# Patient Record
Sex: Female | Born: 1960
Health system: Southern US, Community
[De-identification: ages and names within clinical notes are randomized; demographics above are authoritative.]

## PROBLEM LIST (undated history)

## (undated) DIAGNOSIS — E0590 Thyrotoxicosis, unspecified without thyrotoxic crisis or storm: Secondary | ICD-10-CM

## (undated) DIAGNOSIS — R42 Dizziness and giddiness: Secondary | ICD-10-CM

## (undated) DIAGNOSIS — Z972 Presence of dental prosthetic device (complete) (partial): Secondary | ICD-10-CM

## (undated) DIAGNOSIS — M109 Gout, unspecified: Secondary | ICD-10-CM

## (undated) DIAGNOSIS — I10 Essential (primary) hypertension: Secondary | ICD-10-CM

## (undated) DIAGNOSIS — K219 Gastro-esophageal reflux disease without esophagitis: Secondary | ICD-10-CM

## (undated) DIAGNOSIS — F419 Anxiety disorder, unspecified: Secondary | ICD-10-CM

## (undated) DIAGNOSIS — M1990 Unspecified osteoarthritis, unspecified site: Secondary | ICD-10-CM

## (undated) DIAGNOSIS — T8859XA Other complications of anesthesia, initial encounter: Secondary | ICD-10-CM

## (undated) DIAGNOSIS — E079 Disorder of thyroid, unspecified: Secondary | ICD-10-CM

## (undated) HISTORY — DX: Gout, unspecified: M10.9

## (undated) HISTORY — DX: Dizziness and giddiness: R42

## (undated) HISTORY — DX: Disorder of thyroid, unspecified: E07.9

## (undated) HISTORY — DX: Essential (primary) hypertension: I10

## (undated) HISTORY — PX: WRIST SURGERY: SHX841

---

## 2008-09-06 HISTORY — PX: ENDOMETRIAL ABLATION: SHX621

## 2018-11-10 DIAGNOSIS — J0300 Acute streptococcal tonsillitis, unspecified: Secondary | ICD-10-CM | POA: Diagnosis not present

## 2018-11-23 ENCOUNTER — Ambulatory Visit: Payer: Self-pay | Admitting: Family Medicine

## 2018-11-30 ENCOUNTER — Ambulatory Visit: Payer: Self-pay | Admitting: Internal Medicine

## 2019-01-12 ENCOUNTER — Ambulatory Visit (INDEPENDENT_AMBULATORY_CARE_PROVIDER_SITE_OTHER): Payer: BLUE CROSS/BLUE SHIELD | Admitting: Family Medicine

## 2019-01-12 ENCOUNTER — Telehealth: Payer: Self-pay | Admitting: Family Medicine

## 2019-01-12 ENCOUNTER — Encounter: Payer: Self-pay | Admitting: Family Medicine

## 2019-01-12 ENCOUNTER — Other Ambulatory Visit: Payer: Self-pay

## 2019-01-12 DIAGNOSIS — G629 Polyneuropathy, unspecified: Secondary | ICD-10-CM | POA: Diagnosis not present

## 2019-01-12 DIAGNOSIS — I10 Essential (primary) hypertension: Secondary | ICD-10-CM | POA: Diagnosis not present

## 2019-01-12 DIAGNOSIS — E0590 Thyrotoxicosis, unspecified without thyrotoxic crisis or storm: Secondary | ICD-10-CM | POA: Diagnosis not present

## 2019-01-12 DIAGNOSIS — M109 Gout, unspecified: Secondary | ICD-10-CM | POA: Diagnosis not present

## 2019-01-12 DIAGNOSIS — Z114 Encounter for screening for human immunodeficiency virus [HIV]: Secondary | ICD-10-CM

## 2019-01-12 DIAGNOSIS — Z1159 Encounter for screening for other viral diseases: Secondary | ICD-10-CM

## 2019-01-12 MED ORDER — PROPYLTHIOURACIL 50 MG PO TABS: 50.0000 mg | ORAL_TABLET | Freq: Three times a day (TID) | ORAL | 0 refills | Status: DC

## 2019-01-12 MED ORDER — GABAPENTIN 100 MG PO CAPS: 100.0000 mg | ORAL_CAPSULE | Freq: Three times a day (TID) | ORAL | 0 refills | Status: DC

## 2019-01-12 MED ORDER — PROPRANOLOL HCL 20 MG PO TABS: 20.0000 mg | ORAL_TABLET | Freq: Three times a day (TID) | ORAL | 0 refills | Status: DC

## 2019-01-12 MED ORDER — COLCHICINE 0.6 MG PO TABS: ORAL_TABLET | ORAL | 0 refills | Status: DC

## 2019-01-12 MED ORDER — AZILSARTAN MEDOXOMIL 40 MG PO TABS: 1.0000 | ORAL_TABLET | Freq: Every day | ORAL | 0 refills | Status: DC

## 2019-01-12 NOTE — Telephone Encounter (Signed)
Lab appt scheduled.

## 2019-01-12 NOTE — Telephone Encounter (Signed)
Please put her on for fasting labs for Tuesday 5/19 at 930 AM   Thanks  LG

## 2019-01-12 NOTE — Progress Notes (Signed)
Patient ID: Elizabeth [name], female   DOB: 09-13-1960, 58 y.o.   MRN: [id number]    Virtual Visit via video Note  This visit type was conducted due to national recommendations for restrictions regarding the COVID-19 pandemic (e.g. social distancing).  This format is felt to be most appropriate for this patient at this time.  All issues noted in this document were discussed and addressed.  No physical exam was performed (except for noted visual exam findings with Video Visits).   I connected with Elizabeth [name] today at  1:00 PM EDT by a video enabled telemedicine application and verified that I am speaking with the correct person using two identifiers. Location patient: home Location provider: [facility] Persons participating in the virtual visit: patient, provider  I discussed the limitations, risks, security and privacy concerns of performing an evaluation and management service by video and the availability of in person appointments. I also discussed with the patient that there may be a patient responsible charge related to this service. The patient expressed understanding and agreed to proceed.  HPI:  Patient and I connected via video to have patient establish with PCP.  She moved to the [place] area from [place] about 3 months ago.  She needs to have a primary care here so she can get medications refilled.  With transitions [facility], well so far.  Patient has been remaining home for most of her time throughout the COVID-19 pandemic.  Does state about 2 weeks ago she had a flareup of gout in right big toe after eating a lot of bacon and red meat.  Patient in the past had used an as needed medication to calm gout flareup and did not have it at this time.  Gout flareup has calm down, but would like to have medication on hand in the future if it does recur.  Checked her blood pressure at home a few days ago, and this morning 132/80.  Feels well on current blood pressure  medications.  Heart rates have been between 68-70 does not feel overly tired on propranolol.  Tolerating her PTU without any issues, feels well on stabilization dose.  Has not had blood work in approximately 6 to 8 months.  Believes her last Pap smear was between 2 or 3 years ago, never has had abnormal Pap smear.  Colonoscopy was in 2019.  Past medical, social, surgical and family history reviewed and updated accordingly in chart.  ROS:   Constitutional: Negative for chills, fatigue and fever.  HENT: Negative for congestion, ear pain, sinus pain and sore throat.   Eyes: Negative.   Respiratory: Negative for cough, shortness of breath and wheezing.   Cardiovascular: Negative for chest pain, palpitations and leg swelling.  Gastrointestinal: Negative for abdominal pain, diarrhea, nausea and vomiting.  Genitourinary: Negative for dysuria, frequency and urgency.  Musculoskeletal: Negative for arthralgias and myalgias.  Skin: Negative for color change, pallor and rash.  Neurological: Negative for syncope, light-headedness and headaches.  Psychiatric/Behavioral: The patient is not nervous/anxious.     Past Medical History:  Diagnosis Date  . Gout   . Hypertension   . Thyroid disease     Past Surgical History:  Procedure Laterality Date  . ENDOMETRIAL ABLATION  2010    Family History  Problem Relation Age of Onset  . Hypertension Mother   . Heart disease Mother   . Hypertension Father     Social History   Tobacco Use  . Smoking status: Former Smoker  Last attempt to quit: 01/12/1999    Years since quitting: 20.0  . Smokeless tobacco: Never Used  Substance Use Topics  . Alcohol use: Yes    Comment: ocassionally    Current Outpatient Medications:  .  Azilsartan Medoxomil 40 MG TABS, Take by mouth., Disp: , Rfl:  .  FLUTICASONE PROPIONATE NA, Place into the nose., Disp: , Rfl:  .  gabapentin (NEURONTIN) 100 MG capsule, Take 100 mg by mouth 3 (three) times daily., Disp:  , Rfl:  .  ibuprofen (ADVIL) 200 MG tablet, Take 200 mg by mouth every 6 (six) hours as needed., Disp: , Rfl:  .  propranolol (INDERAL) 20 MG tablet, Take 20 mg by mouth 3 (three) times daily., Disp: , Rfl:  .  propylthiouracil (PTU) 50 MG tablet, Take 50 mg by mouth 3 (three) times daily., Disp: , Rfl:   EXAM:  GENERAL: alert, oriented, appears well and in no acute distress  HEENT: atraumatic, conjunttiva clear, no obvious abnormalities on inspection of external nose and ears  NECK: normal movements of the head and neck  LUNGS: on inspection no signs of respiratory distress, breathing rate appears normal, no obvious gross SOB, gasping or wheezing  CV: no obvious cyanosis  MS: moves all visible extremities without noticeable abnormality  PSYCH/NEURO: pleasant and cooperative, no obvious depression or anxiety, speech and thought processing grossly intact  ASSESSMENT AND PLAN:  Discussed the following assessment and plan:  Essential hypertension - Plan: Azilsartan Medoxomil 40 MG TABS, propranolol (INDERAL) 20 MG tablet, CBC w/Diff, Comp Met (CMET), Lipid panel, Thyroid Panel With TSH  Hyperthyroidism - Plan: propylthiouracil (PTU) 50 MG tablet, Thyroid Panel With TSH, HgB A1c  Neuropathy - Plan: gabapentin (NEURONTIN) 100 MG capsule, CBC w/Diff, Comp Met (CMET)  Acute gout, unspecified cause, unspecified site - Plan: colchicine 0.6 MG tablet, Uric acid  Encounter for screening for HIV - Plan: HIV antibody (with reflex)  Need for hepatitis C screening test - Plan: Hepatitis C Antibody  Medication refill sent in for patient.  We will have her come in for blood work in the next 1 to 2 weeks to check blood count, electrolyte panel, liver and kidney functions, thyroid panel, cholesterol panel and do screening for HIV and hep C.  We will make any medication adjustments as needed based on lab results.  Colcrys sent in for patient to use as needed for a gout flare up. We will plan to have  patient come in for a complete physical exam at some point this summer and we will get her Pap smear completed and get her mammogram ordered at that time.   I discussed the assessment and treatment plan with the patient. The patient was provided an opportunity to ask questions and all were answered. The patient agreed with the plan and demonstrated an understanding of the instructions.   The patient was advised to call back or seek an in-person evaluation if the symptoms worsen or if the condition fails to improve as anticipated.  Jodelle Green, FNP

## 2019-01-23 ENCOUNTER — Other Ambulatory Visit (INDEPENDENT_AMBULATORY_CARE_PROVIDER_SITE_OTHER): Payer: BLUE CROSS/BLUE SHIELD

## 2019-01-23 ENCOUNTER — Other Ambulatory Visit: Payer: Self-pay

## 2019-01-23 DIAGNOSIS — M109 Gout, unspecified: Secondary | ICD-10-CM

## 2019-01-23 DIAGNOSIS — G629 Polyneuropathy, unspecified: Secondary | ICD-10-CM | POA: Diagnosis not present

## 2019-01-23 DIAGNOSIS — E0590 Thyrotoxicosis, unspecified without thyrotoxic crisis or storm: Secondary | ICD-10-CM

## 2019-01-23 DIAGNOSIS — I10 Essential (primary) hypertension: Secondary | ICD-10-CM | POA: Diagnosis not present

## 2019-01-23 DIAGNOSIS — Z1159 Encounter for screening for other viral diseases: Secondary | ICD-10-CM

## 2019-01-23 DIAGNOSIS — Z114 Encounter for screening for human immunodeficiency virus [HIV]: Secondary | ICD-10-CM

## 2019-01-23 LAB — CBC WITH DIFFERENTIAL/PLATELET
Basophils Absolute: 0.1 10*3/uL (ref 0.0–0.1)
Basophils Relative: 1.3 % (ref 0.0–3.0)
Eosinophils Absolute: 0.2 10*3/uL (ref 0.0–0.7)
Eosinophils Relative: 2.7 % (ref 0.0–5.0)
HCT: 40.1 % (ref 36.0–46.0)
Hemoglobin: 13.1 g/dL (ref 12.0–15.0)
Lymphocytes Relative: 46.7 % — ABNORMAL HIGH (ref 12.0–46.0)
Lymphs Abs: 2.7 10*3/uL (ref 0.7–4.0)
MCHC: 32.7 g/dL (ref 30.0–36.0)
MCV: 76 fl — ABNORMAL LOW (ref 78.0–100.0)
Monocytes Absolute: 0.6 10*3/uL (ref 0.1–1.0)
Monocytes Relative: 9.6 % (ref 3.0–12.0)
Neutro Abs: 2.3 10*3/uL (ref 1.4–7.7)
Neutrophils Relative %: 39.7 % — ABNORMAL LOW (ref 43.0–77.0)
Platelets: 299 10*3/uL (ref 150.0–400.0)
RBC: 5.28 Mil/uL — ABNORMAL HIGH (ref 3.87–5.11)
RDW: 15.2 % (ref 11.5–15.5)
WBC: 5.9 10*3/uL (ref 4.0–10.5)

## 2019-01-23 LAB — COMPREHENSIVE METABOLIC PANEL
ALT: 16 U/L (ref 0–35)
AST: 17 U/L (ref 0–37)
Albumin: 4 g/dL (ref 3.5–5.2)
Alkaline Phosphatase: 108 U/L (ref 39–117)
BUN: 18 mg/dL (ref 6–23)
CO2: 25 mEq/L (ref 19–32)
Calcium: 10.9 mg/dL — ABNORMAL HIGH (ref 8.4–10.5)
Chloride: 105 mEq/L (ref 96–112)
Creatinine, Ser: 1 mg/dL (ref 0.40–1.20)
GFR: 56.79 mL/min — ABNORMAL LOW (ref >60.00)
Glucose, Bld: 106 mg/dL — ABNORMAL HIGH (ref 70–99)
Potassium: 4.1 mEq/L (ref 3.5–5.1)
Sodium: 135 mEq/L (ref 135–145)
Total Bilirubin: 0.3 mg/dL (ref 0.2–1.2)
Total Protein: 7.1 g/dL (ref 6.0–8.3)

## 2019-01-23 LAB — LIPID PANEL
Cholesterol: 222 mg/dL — ABNORMAL HIGH (ref 0–200)
HDL: 54.1 mg/dL (ref >39.00)
LDL Cholesterol: 147 mg/dL — ABNORMAL HIGH (ref 0–99)
NonHDL: 167.76
Total CHOL/HDL Ratio: 4
Triglycerides: 104 mg/dL (ref 0.0–149.0)
VLDL: 20.8 mg/dL (ref 0.0–40.0)

## 2019-01-23 LAB — URIC ACID: Uric Acid, Serum: 5.9 mg/dL (ref 2.4–7.0)

## 2019-01-23 LAB — HEMOGLOBIN A1C: Hgb A1c MFr Bld: 5.8 % (ref 4.6–6.5)

## 2019-01-24 LAB — HEPATITIS C ANTIBODY
Hepatitis C Ab: NONREACTIVE
SIGNAL TO CUT-OFF: 0.01 (ref <1.00)

## 2019-01-24 LAB — THYROID PANEL WITH TSH
Free Thyroxine Index: 2.3 (ref 1.4–3.8)
T3 Uptake: 28 % (ref 22–35)
T4, Total: 8.2 ug/dL (ref 5.1–11.9)
TSH: 1.9 mIU/L (ref 0.40–4.50)

## 2019-01-24 LAB — HIV ANTIBODY (ROUTINE TESTING W REFLEX): HIV 1&2 Ab, 4th Generation: NONREACTIVE

## 2019-02-01 ENCOUNTER — Ambulatory Visit: Payer: Self-pay | Admitting: Internal Medicine

## 2019-02-27 ENCOUNTER — Other Ambulatory Visit: Payer: Self-pay | Admitting: Family Medicine

## 2019-02-27 ENCOUNTER — Ambulatory Visit: Payer: BLUE CROSS/BLUE SHIELD | Admitting: Family Medicine

## 2019-02-27 ENCOUNTER — Other Ambulatory Visit: Payer: Self-pay

## 2019-02-27 ENCOUNTER — Other Ambulatory Visit (HOSPITAL_COMMUNITY)
Admission: RE | Admit: 2019-02-27 | Discharge: 2019-02-27 | Disposition: A | Discharge status: Home / Self Care | Payer: BLUE CROSS/BLUE SHIELD | Source: Ambulatory Visit | Attending: Family Medicine | Admitting: Family Medicine

## 2019-02-27 ENCOUNTER — Encounter: Payer: Self-pay | Admitting: Family Medicine

## 2019-02-27 VITALS — BP 158/94 | HR 63 | Temp 98.5°F | Resp 18 | Ht 64.0 in | Wt 180.6 lb

## 2019-02-27 DIAGNOSIS — R1031 Right lower quadrant pain: Secondary | ICD-10-CM

## 2019-02-27 DIAGNOSIS — I10 Essential (primary) hypertension: Secondary | ICD-10-CM

## 2019-02-27 DIAGNOSIS — Z1231 Encounter for screening mammogram for malignant neoplasm of breast: Secondary | ICD-10-CM

## 2019-02-27 DIAGNOSIS — Z124 Encounter for screening for malignant neoplasm of cervix: Secondary | ICD-10-CM

## 2019-02-27 DIAGNOSIS — Z0000 Encounter for general adult medical examination without abnormal findings: Secondary | ICD-10-CM | POA: Diagnosis not present

## 2019-02-27 DIAGNOSIS — E0590 Thyrotoxicosis, unspecified without thyrotoxic crisis or storm: Secondary | ICD-10-CM | POA: Diagnosis not present

## 2019-02-27 LAB — B12 AND FOLATE PANEL
Folate: 10 ng/mL (ref >5.9)
Vitamin B-12: 494 pg/mL (ref 211–911)

## 2019-02-27 LAB — CBC WITH DIFFERENTIAL/PLATELET
Basophils Absolute: 0 10*3/uL (ref 0.0–0.1)
Basophils Relative: 0.5 % (ref 0.0–3.0)
Eosinophils Absolute: 0.1 10*3/uL (ref 0.0–0.7)
Eosinophils Relative: 2.3 % (ref 0.0–5.0)
HCT: 40.1 % (ref 36.0–46.0)
Hemoglobin: 13 g/dL (ref 12.0–15.0)
Lymphocytes Relative: 45.6 % (ref 12.0–46.0)
Lymphs Abs: 2.5 10*3/uL (ref 0.7–4.0)
MCHC: 32.5 g/dL (ref 30.0–36.0)
MCV: 76.9 fl — ABNORMAL LOW (ref 78.0–100.0)
Monocytes Absolute: 0.5 10*3/uL (ref 0.1–1.0)
Monocytes Relative: 8.7 % (ref 3.0–12.0)
Neutro Abs: 2.3 10*3/uL (ref 1.4–7.7)
Neutrophils Relative %: 42.9 % — ABNORMAL LOW (ref 43.0–77.0)
Platelets: 299 10*3/uL (ref 150.0–400.0)
RBC: 5.21 Mil/uL — ABNORMAL HIGH (ref 3.87–5.11)
RDW: 15.6 % — ABNORMAL HIGH (ref 11.5–15.5)
WBC: 5.4 10*3/uL (ref 4.0–10.5)

## 2019-02-27 LAB — COMPREHENSIVE METABOLIC PANEL
ALT: 16 U/L (ref 0–35)
AST: 16 U/L (ref 0–37)
Albumin: 4 g/dL (ref 3.5–5.2)
Alkaline Phosphatase: 119 U/L — ABNORMAL HIGH (ref 39–117)
BUN: 16 mg/dL (ref 6–23)
CO2: 27 mEq/L (ref 19–32)
Calcium: 11.4 mg/dL — ABNORMAL HIGH (ref 8.4–10.5)
Chloride: 103 mEq/L (ref 96–112)
Creatinine, Ser: 0.93 mg/dL (ref 0.40–1.20)
GFR: 61.73 mL/min (ref >60.00)
Glucose, Bld: 103 mg/dL — ABNORMAL HIGH (ref 70–99)
Potassium: 4.3 mEq/L (ref 3.5–5.1)
Sodium: 136 mEq/L (ref 135–145)
Total Bilirubin: 0.4 mg/dL (ref 0.2–1.2)
Total Protein: 7 g/dL (ref 6.0–8.3)

## 2019-02-27 LAB — LIPID PANEL
Cholesterol: 238 mg/dL — ABNORMAL HIGH (ref 0–200)
HDL: 54.9 mg/dL (ref >39.00)
LDL Cholesterol: 158 mg/dL — ABNORMAL HIGH (ref 0–99)
NonHDL: 182.63
Total CHOL/HDL Ratio: 4
Triglycerides: 125 mg/dL (ref 0.0–149.0)
VLDL: 25 mg/dL (ref 0.0–40.0)

## 2019-02-27 LAB — VITAMIN D 25 HYDROXY (VIT D DEFICIENCY, FRACTURES): VITD: 34.72 ng/mL (ref 30.00–100.00)

## 2019-02-27 MED ORDER — AMLODIPINE BESYLATE 10 MG PO TABS: 10.0000 mg | ORAL_TABLET | Freq: Every day | ORAL | 1 refills | Status: DC

## 2019-02-27 NOTE — Progress Notes (Signed)
Subjective:    Patient ID: Elizabeth [name], female    DOB: June 17, 1961, 58 y.o.   MRN: [id number]  HPI   Patient presents to clinic for complete physical exam and for follow-up on blood pressure and hypothyroidism.  Patient recently moved to the area from [place].  Currently she is feeling well, only complaint is some right lower quadrant cramping off and on for many months.  Patient has had a colonoscopy recently, in 2019 per patient report.  We will request these records for confirmation. Denies diarrhea or constipation.   Patient is unsure of last mammogram or Pap smear.  She usually sees dentist every 6 months and eye doctor every year.  Patient does have appointments with the pathologist and eye doctor set up for this summer.  Patient has been on propranolol and azilsartan for many years for BP control.  Not sure if the combination is helping.  States that as losartan is quite expensive would prefer to get off that medication and try something like respective and something that works better.  Denies any chest pain, palpitations or lower extremity swelling.  Denies seeing a endocrinologist recently for her hyperthyroidism.  Patient states she has been on the 50 mg PTU dose for as far she can remember.  Patient Active Problem List   Diagnosis Date Noted  . Essential hypertension 01/12/2019  . Hyperthyroidism 01/12/2019  . Neuropathy 01/12/2019   Social History   Tobacco Use  . Smoking status: Former Smoker    Quit date: 01/12/1999    Years since quitting: 20.1  . Smokeless tobacco: Never Used  Substance Use Topics  . Alcohol use: Yes    Comment: ocassionally   Past Surgical History:  Procedure Laterality Date  . ENDOMETRIAL ABLATION  2010   Family History  Problem Relation Age of Onset  . Hypertension Mother   . Heart disease Mother   . Hypertension Father      Review of Systems  Constitutional: Negative for chills, fatigue and fever.  HENT: Negative for  congestion, ear pain, sinus pain and sore throat.   Eyes: Negative.   Respiratory: Negative for cough, shortness of breath and wheezing.   Cardiovascular: Negative for chest pain, palpitations and leg swelling.  Gastrointestinal: +RLQ abd pain and cramping off on for months. Negative for diarrhea, nausea and vomiting.  Genitourinary: Negative for dysuria, frequency and urgency.  Musculoskeletal: Negative for arthralgias and myalgias.  Skin: Negative for color change, pallor and rash.  Neurological: Negative for syncope, light-headedness and headaches.  Psychiatric/Behavioral: The patient is not nervous/anxious.    Objective:   Physical Exam Vitals signs and nursing note reviewed.  Constitutional:      Appearance: She is well-developed.  HENT:     Head: Normocephalic and atraumatic.     Right Ear: Tympanic membrane, ear canal and external ear normal.     Left Ear: Tympanic membrane, ear canal and external ear normal.  Eyes:     General: No scleral icterus.       Right eye: No discharge.        Left eye: No discharge.     Conjunctiva/sclera: Conjunctivae normal.     Pupils: Pupils are equal, round, and reactive to light.  Neck:     Musculoskeletal: Normal range of motion and neck supple. No neck rigidity or muscular tenderness.     Thyroid: No thyroid mass or thyroid tenderness.     Trachea: Trachea and phonation normal. No tracheal tenderness or tracheal deviation.  Cardiovascular:     Rate and Rhythm: Normal rate and regular rhythm.     Heart sounds: Normal heart sounds. No murmur. No friction rub. No gallop.   Pulmonary:     Effort: Pulmonary effort is normal. No respiratory distress.     Breath sounds: Normal breath sounds. No stridor. No wheezing, rhonchi or rales.  Chest:     Chest wall: No tenderness.     Breasts: Breasts are symmetrical.        Right: Normal. No swelling, bleeding, inverted nipple, mass, nipple discharge, skin change or tenderness.        Left: No  swelling, bleeding, inverted nipple, mass, nipple discharge, skin change or tenderness.  Abdominal:     General: Bowel sounds are normal. There is no distension.     Palpations: Abdomen is soft. There is no mass.     Tenderness: There is abdominal tenderness (mild RLQ tender with palpation). There is no right CVA tenderness, left CVA tenderness, guarding or rebound.     Hernia: No hernia is present. There is no hernia in the left inguinal area or right inguinal area.  Genitourinary:    Pubic Area: No rash or pubic lice.      Labia:        Right: No rash, tenderness, lesion or injury.        Left: No rash, tenderness, lesion or injury.      Vagina: Normal.     Cervix: Normal.     Uterus: Normal. Not tender.      Adnexa:        Right: No tenderness.         Left: No tenderness.       Comments: Pap collected and sent to lab Musculoskeletal: Normal range of motion.        General: No deformity.  Lymphadenopathy:     Cervical: No cervical adenopathy.     Upper Body:     Right upper body: No supraclavicular, axillary or pectoral adenopathy.     Left upper body: No supraclavicular, axillary or pectoral adenopathy.     Lower Body: No right inguinal adenopathy. No left inguinal adenopathy.  Skin:    General: Skin is warm and dry.     Capillary Refill: Capillary refill takes less than 2 seconds.     Coloration: Skin is not pale.     Findings: No erythema.  Neurological:     General: No focal deficit present.     Mental Status: She is alert and oriented to person, place, and time.     Cranial Nerves: No cranial nerve deficit.     Motor: No weakness.     Gait: Gait normal.  Psychiatric:        Mood and Affect: Mood normal.        Behavior: Behavior normal.        Thought Content: Thought content normal.        Judgment: Judgment normal.    Today's Vitals   02/27/19 0915  BP: (!) 158/94  Pulse: 63  Resp: 18  Temp: 98.5 F (36.9 C)  TempSrc: Oral  SpO2: 97%  Weight: 180 lb 9.6  oz (81.9 kg)  Height: 5\' 4"  (1.626 m)   Body mass index is 31 kg/m.     Assessment & Plan:   Well adult exam/cervical cancer screening/screening mammogram- overall patient appears to be a healthy 58 year old female.  Pap collected and sent to lab.  We will get screening  blood work today.  Reviewed healthy diet and regular exercise, recommended diet high in lean proteins lots of vegetables lower carbs and processed foods.  Recommended good water intake and regular physical activity including walking, cardio activity and lifting small weights.  Discussed safe sun practices including wearing SPF at least 30 when outdoors for extended period of time as well as a wide brim hat and long sleeves of going to be direct sun for extended hours.  Patient always wears seatbelt.  She sees a Pharmacist, community and eye doctor regularly.  Mammogram order placed, patient given information on how to set up this screening exam.  Essential hypertension-BP is elevated today.  We will discontinue as losartan and replace with amlodipine.  She will also continue Inderal.  Hyperthyroidism-we will get new thyroid panel in clinic and also referral to endocrinology.  Right lower quadrant abdominal pain-unclear reason for this pain.  Physical exam is unremarkable.  We will get abdominal ultrasound and lab work to further investigate.   Labs collected in clinic today.   Patient will follow-up here in 2 to 3 weeks for recheck on blood pressure after medication changes.  She is aware someone will contact her in regards to abdominal ultrasound being set up.

## 2019-02-28 LAB — CYTOLOGY - PAP: Diagnosis: NEGATIVE

## 2019-02-28 LAB — THYROID PANEL WITH TSH
Free Thyroxine Index: 1.9 (ref 1.4–3.8)
T3 Uptake: 26 % (ref 22–35)
T4, Total: 7.4 ug/dL (ref 5.1–11.9)
TSH: 1.94 mIU/L (ref 0.40–4.50)

## 2019-03-02 ENCOUNTER — Other Ambulatory Visit: Payer: Self-pay | Admitting: Family Medicine

## 2019-03-02 ENCOUNTER — Telehealth: Payer: Self-pay | Admitting: *Deleted

## 2019-03-02 DIAGNOSIS — R1031 Right lower quadrant pain: Secondary | ICD-10-CM

## 2019-03-02 NOTE — Telephone Encounter (Signed)
Order changed to pelvic US  Pain is in RLQ, would like that area visualized to see of appendix, uterus, ovary are the possible causes

## 2019-03-02 NOTE — Telephone Encounter (Signed)
Called  Kim at [place] dept, and she stated she can check everything except for the Appendix, because the Pt is over 58 yrs old you would have to order a CT scan for the Pt

## 2019-03-02 NOTE — Telephone Encounter (Signed)
Copied from [place] [id number]. Topic: Referral - Status >> Mar 02, 2019  1:48 PM Scherrie Gerlach wrote: Reason for CRM: Maudie Mercury in [place] dept at [facility] calling to ask what exactly are you looking for in the [place] order.  She states what you have ordered will only look at everything above the belly button, and according to the notes, pt will need a PELVIS US or appendix. Please call to clarify so the order can be changed before Monday when pt is scheduled. [phone]

## 2019-03-02 NOTE — Telephone Encounter (Signed)
Ok that's fine  Will just do [place] for now  Thanks  LG

## 2019-03-05 ENCOUNTER — Ambulatory Visit
Admission: RE | Admit: 2019-03-05 | Discharge: 2019-03-05 | Disposition: A | Discharge status: Home / Self Care | Payer: BLUE CROSS/BLUE SHIELD | Source: Ambulatory Visit | Attending: Family Medicine | Admitting: Family Medicine

## 2019-03-05 ENCOUNTER — Other Ambulatory Visit: Payer: Self-pay

## 2019-03-05 DIAGNOSIS — Z8742 Personal history of other diseases of the female genital tract: Secondary | ICD-10-CM | POA: Diagnosis not present

## 2019-03-05 DIAGNOSIS — R1031 Right lower quadrant pain: Secondary | ICD-10-CM | POA: Insufficient documentation

## 2019-03-05 DIAGNOSIS — D259 Leiomyoma of uterus, unspecified: Secondary | ICD-10-CM | POA: Diagnosis not present

## 2019-03-12 ENCOUNTER — Telehealth: Payer: Self-pay | Admitting: *Deleted

## 2019-03-12 NOTE — Telephone Encounter (Signed)
Copied from [place] [id number]. Topic: Quick Communication - Appointment Cancellation >> Mar 12, 2019  7:09 AM Elizabeth [name], Helene Kelp D wrote: Patient called to cancel appointment scheduled for 03/13/19. Patient HAS NOT rescheduled their appointment. Patient would like to reschedule appt.  Route to department's PEC pool.

## 2019-03-12 NOTE — Telephone Encounter (Signed)
Called and rescheduled pt.

## 2019-03-13 ENCOUNTER — Ambulatory Visit: Payer: BLUE CROSS/BLUE SHIELD | Admitting: Family Medicine

## 2019-03-15 ENCOUNTER — Ambulatory Visit: Payer: BLUE CROSS/BLUE SHIELD | Admitting: Family Medicine

## 2019-03-19 ENCOUNTER — Ambulatory Visit: Payer: BLUE CROSS/BLUE SHIELD | Admitting: Family Medicine

## 2019-03-19 ENCOUNTER — Other Ambulatory Visit: Payer: Self-pay

## 2019-03-19 VITALS — BP 152/90 | HR 72 | Temp 98.8°F | Resp 16 | Wt 181.0 lb

## 2019-03-19 DIAGNOSIS — I10 Essential (primary) hypertension: Secondary | ICD-10-CM | POA: Diagnosis not present

## 2019-03-19 DIAGNOSIS — M1A9XX0 Chronic gout, unspecified, without tophus (tophi): Secondary | ICD-10-CM | POA: Diagnosis not present

## 2019-03-19 DIAGNOSIS — E785 Hyperlipidemia, unspecified: Secondary | ICD-10-CM

## 2019-03-19 DIAGNOSIS — E0590 Thyrotoxicosis, unspecified without thyrotoxic crisis or storm: Secondary | ICD-10-CM

## 2019-03-19 MED ORDER — HYDROCHLOROTHIAZIDE 25 MG PO TABS: 25.0000 mg | ORAL_TABLET | Freq: Every day | ORAL | 1 refills | Status: DC

## 2019-03-19 NOTE — Patient Instructions (Signed)

## 2019-03-19 NOTE — Progress Notes (Signed)
Subjective:    Patient ID: Elizabeth [name], female    DOB: 07-29-1961, 58 y.o.   MRN: [id number]  HPI   Patient presents to clinic for follow-up on blood pressure after medication changes.  Previously patient had been on azilsartan and propranolol for BP control and palpitations.  Patient felt well on the propranolol for the palpitation control, but had concerns over cost of azilsartan and not being sure if it was helping her blood pressure enough to offset the cost of the drug.  At last visit we stopped azilsartan and changed her amlodipine 10 mg daily and she continue propranolol 20 mg 3 times daily.  Patient seems to be tolerating this combination well without any issues.  States that she did have some swelling of lower extremities for the weekend, but thinks this could have been related to heat outdoors and being on her feet, swelling went down when she elevates her legs.  Otherwise denies any chest pain, shortness of breath, palpitations, shortness breath or wheezing.  Patient is also interested in seeing a nutritionist to help her better determine diet choices to help with her blood pressure, cholesterol, hyperthyroidism and also chronic gout.  Patient's thyroid levels are stable according to last blood work, and she has upcoming endocrinology appointment.  Has not had any gout flareup in many months, but only treats gout flareups as needed with colchicine.  Patient Active Problem List   Diagnosis Date Noted  . Hyperlipidemia 03/19/2019  . 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 Essential hypertension 01/12/2019  . Hyperthyroidism 01/12/2019  . Neuropathy 01/12/2019   Social History   Tobacco Use  . Smoking status: Former Smoker    Quit date: 01/12/1999    Years since quitting: 20.1  . Smokeless tobacco: Never Used  Substance Use Topics  . Alcohol use: Yes    Comment: ocassionally    Review of Systems  Constitutional: Negative for chills, fatigue and fever.  HENT:  Negative for congestion, ear pain, sinus pain and sore throat.   Eyes: Negative.   Respiratory: Negative for cough, shortness of breath and wheezing.   Cardiovascular: Negative for chest pain, palpitations and leg swelling.  Gastrointestinal: Negative for abdominal pain, diarrhea, nausea and vomiting.  Genitourinary: Negative for dysuria, frequency and urgency.  Musculoskeletal: Negative for arthralgias and myalgias.  Skin: Negative for color change, pallor and rash.  Neurological: Negative for syncope, light-headedness and headaches.  Psychiatric/Behavioral: The patient is not nervous/anxious.       Objective:   Physical Exam Vitals signs and nursing note reviewed.  Constitutional:      General: She is not in acute distress.    Appearance: She is not ill-appearing, toxic-appearing or diaphoretic.  HENT:     Head: Normocephalic and atraumatic.  Eyes:     General: No scleral icterus.    Extraocular Movements: Extraocular movements intact.     Conjunctiva/sclera: Conjunctivae normal.  Neck:     Musculoskeletal: Normal range of motion and neck supple. No neck rigidity.     Vascular: No carotid bruit.  Cardiovascular:     Rate and Rhythm: Normal rate and regular rhythm.     Pulses: Normal pulses.     Heart sounds: Normal heart sounds. No murmur. No friction rub. No gallop.   Pulmonary:     Effort: Pulmonary effort is normal. No respiratory distress.     Breath sounds: Normal breath sounds.  Musculoskeletal:     Right lower leg: No edema.     Left  lower leg: No edema.  Skin:    General: Skin is warm and dry.     Coloration: Skin is not jaundiced or pale.     Findings: No erythema.  Neurological:     General: No focal deficit present.     Mental Status: She is alert and oriented to person, place, and time.  Psychiatric:        Mood and Affect: Mood normal.        Behavior: Behavior normal.    Vitals:   03/19/19 0826  BP: (!) 152/90  Pulse: 72  Resp: 16  Temp: 98.8 F  (37.1 C)  SpO2: 97%      Assessment & Plan:    Hypertension - we will have patient continue amlodipine 10 mg daily and propranolol 20 mg 3 times daily.  We will add hydrochlorothiazide 25 mg daily to help better control blood pressure.  Patient given handout outlining DASH diet eating plan to help her better determine good food choices.  Nutritionist referral also placed.  Hyperlipidemia -  advised patient that the advised patient that since diet eating plan is great for people have high cholesterol as well as blood pressure issues.   Hyperthyroidism - last labs revealed thyroid levels are stable on current dose of PTU.  She will follow regularly with endocrinology.  Gout - advised patient that her gout not appear to be severe because we are able to treat the flareups as needed and flareups did not have it often.  Discussed higher.  Foods to avoid in excess, but advised she would like to eat some of his food she is able to in moderation.    Patient will follow-up here in 2 weeks for recheck of blood pressure after addition of new medication.  Also made aware if she does not hear anything about nutritious cholesterol the next 2 weeks to let us know.

## 2019-04-02 ENCOUNTER — Other Ambulatory Visit: Payer: Self-pay

## 2019-04-02 ENCOUNTER — Ambulatory Visit (INDEPENDENT_AMBULATORY_CARE_PROVIDER_SITE_OTHER): Payer: BLUE CROSS/BLUE SHIELD | Admitting: Family Medicine

## 2019-04-02 DIAGNOSIS — I10 Essential (primary) hypertension: Secondary | ICD-10-CM | POA: Diagnosis not present

## 2019-04-02 DIAGNOSIS — K089 Disorder of teeth and supporting structures, unspecified: Secondary | ICD-10-CM

## 2019-04-02 NOTE — Progress Notes (Signed)
Patient ID: Elizabeth [name], female   DOB: February 28, 1961, 58 y.o.   MRN: [id number]    Virtual Visit via video Note  This visit type was conducted due to national recommendations for restrictions regarding the COVID-19 pandemic (e.g. social distancing).  This format is felt to be most appropriate for this patient at this time.  All issues noted in this document were discussed and addressed.  No physical exam was performed (except for noted visual exam findings with Video Visits).   I connected with Elizabeth [name] today at  8:20 AM EDT by a video enabled telemedicine application or telephone and verified that I am speaking with the correct person using two identifiers. Location patient: home Location provider: work or home office Persons participating in the virtual visit: patient, provider  I discussed the limitations, risks, security and privacy concerns of performing an evaluation and management service by telephone and the availability of in person appointments. I also discussed with the patient that there may be a patient responsible charge related to this service. The patient expressed understanding and agreed to proceed.  HPI:  Patient and I connected via video to follow-up on blood pressure since adding hydrochlorothiazide.  She is tolerating this medication without any adverse effects.  Has noticed that the mild swelling she did have his ankles has gone down she is very happy about this.  Denies any chest pain, palpitations, shortness breath or wheezing.  Currently does not have blood pressure cuff at home, would like a electronic blood pressure cuff prescription.  Was at the pharmacy a couple of days ago and check pressure, reading was 130/78.  Patient also wondering if it is safe to see dentist.  She did lose a tooth, states tooth was on bottom jaw and it was loose and ended up being knocked out when eating chicken.  States that she had known this tooth was going back for a long time, but  had put off going to the dentist due to moving and then also put off going to the dentist due to the coronavirus pandemic.    ROS: See pertinent positives and negatives per HPI.  Past Medical History:  Diagnosis Date   Gout    Hypertension    Thyroid disease     Past Surgical History:  Procedure Laterality Date   ENDOMETRIAL ABLATION  2010    Family History  Problem Relation Age of Onset   Hypertension Mother    Heart disease Mother    Hypertension Father    Social History   Tobacco Use   Smoking status: Former Smoker    Quit date: 01/12/1999    Years since quitting: 20.2   Smokeless tobacco: Never Used  Substance Use Topics   Alcohol use: Yes    Comment: ocassionally    Current Outpatient Medications:    colchicine 0.6 MG tablet, Take 2 tablets by mouth at onset of gout flare, then in 1 hour take 1 tablet by mouth., Disp: 3 tablet, Rfl: 0   FLUTICASONE PROPIONATE NA, Place into the nose., Disp: , Rfl:    hydrochlorothiazide (HYDRODIURIL) 25 MG tablet, Take 1 tablet (25 mg total) by mouth daily., Disp: 90 tablet, Rfl: 1   propranolol (INDERAL) 20 MG tablet, Take 1 tablet (20 mg total) by mouth 3 (three) times daily., Disp: 270 tablet, Rfl: 0   propylthiouracil (PTU) 50 MG tablet, Take 1 tablet (50 mg total) by mouth 3 (three) times daily., Disp: 270 tablet, Rfl: 0   amLODipine (NORVASC) 10  MG tablet, Take 1 tablet (10 mg total) by mouth daily., Disp: 30 tablet, Rfl: 1   gabapentin (NEURONTIN) 100 MG capsule, Take 1 capsule (100 mg total) by mouth 3 (three) times daily., Disp: 270 capsule, Rfl: 0   ibuprofen (ADVIL) 200 MG tablet, Take 200 mg by mouth every 6 (six) hours as needed., Disp: , Rfl:   EXAM:  GENERAL: alert, oriented, appears well and in no acute distress  HEENT: atraumatic, conjunttiva clear, no obvious abnormalities on inspection of external nose and ears  NECK: normal movements of the head and neck  LUNGS: on inspection no signs of  respiratory distress, breathing rate appears normal, no obvious gross SOB, gasping or wheezing  CV: no obvious cyanosis  MS: moves all visible extremities without noticeable abnormality.  LEGS: No LE edema seen over video feed.   PSYCH/NEURO: pleasant and cooperative, no obvious depression or anxiety, speech and thought processing grossly intact  ASSESSMENT AND PLAN:  Discussed the following assessment and plan:  Hypertension-patient tolerating addition of hydrochlorothiazide well.  I will print off a prescription for blood pressure cuff and we will mail out to patient.  Advised to spot check blood pressure 3-4 times a week and keep a log of readings.  Advised if she notices her blood pressure creeping higher than 140/90 to call office and let us know.  Also discussed healthy diet and regular physical activity.  Recommended a Dash style diet, monitoring salt intake and eating lots of lean proteins and various vegetables.  Tooth issue- strongly encourage patient to seek dental care for her tooth issue.  Advised that dentists are taking on the required questions to keep themselves and the patient states during the pandemic.  Advised patient that dental issues can get out of hand and turn into bad infections, so it is better to take care of a dental problem sooner rather than later.    I discussed the assessment and treatment plan with the patient. The patient was provided an opportunity to ask questions and all were answered. The patient agreed with the plan and demonstrated an understanding of the instructions.   The patient was advised to call back or seek an in-person evaluation if the symptoms worsen or if the condition fails to improve as anticipated.   Patient will follow-up in approximately 3 months for recheck on chronic conditions and we will plan to get new lab work at that time.  She is aware to call office anytime with questions or if anything is needed.   Jodelle Green, FNP

## 2019-04-09 ENCOUNTER — Ambulatory Visit
Admission: RE | Admit: 2019-04-09 | Discharge: 2019-04-09 | Disposition: A | Discharge status: Home / Self Care | Payer: BLUE CROSS/BLUE SHIELD | Source: Ambulatory Visit | Attending: Family Medicine | Admitting: Family Medicine

## 2019-04-09 ENCOUNTER — Other Ambulatory Visit: Payer: Self-pay

## 2019-04-09 DIAGNOSIS — Z1231 Encounter for screening mammogram for malignant neoplasm of breast: Secondary | ICD-10-CM | POA: Diagnosis not present

## 2019-04-23 ENCOUNTER — Other Ambulatory Visit: Payer: Self-pay | Admitting: Family Medicine

## 2019-04-23 DIAGNOSIS — I10 Essential (primary) hypertension: Secondary | ICD-10-CM

## 2019-04-23 DIAGNOSIS — E0590 Thyrotoxicosis, unspecified without thyrotoxic crisis or storm: Secondary | ICD-10-CM

## 2019-04-24 ENCOUNTER — Other Ambulatory Visit: Payer: Self-pay | Admitting: Family Medicine

## 2019-04-24 DIAGNOSIS — N6489 Other specified disorders of breast: Secondary | ICD-10-CM

## 2019-04-24 DIAGNOSIS — R928 Other abnormal and inconclusive findings on diagnostic imaging of breast: Secondary | ICD-10-CM

## 2019-04-24 DIAGNOSIS — N6310 Unspecified lump in the right breast, unspecified quadrant: Secondary | ICD-10-CM

## 2019-05-03 ENCOUNTER — Ambulatory Visit: Payer: BLUE CROSS/BLUE SHIELD | Admitting: Dietician

## 2019-05-10 ENCOUNTER — Ambulatory Visit
Admission: RE | Admit: 2019-05-10 | Discharge: 2019-05-10 | Disposition: A | Discharge status: Home / Self Care | Payer: BLUE CROSS/BLUE SHIELD | Source: Ambulatory Visit | Attending: Family Medicine | Admitting: Family Medicine

## 2019-05-10 DIAGNOSIS — N6310 Unspecified lump in the right breast, unspecified quadrant: Secondary | ICD-10-CM | POA: Diagnosis not present

## 2019-05-10 DIAGNOSIS — R928 Other abnormal and inconclusive findings on diagnostic imaging of breast: Secondary | ICD-10-CM

## 2019-05-10 DIAGNOSIS — N6489 Other specified disorders of breast: Secondary | ICD-10-CM

## 2019-05-10 DIAGNOSIS — N6001 Solitary cyst of right breast: Secondary | ICD-10-CM | POA: Diagnosis not present

## 2019-05-17 ENCOUNTER — Encounter: Payer: BLUE CROSS/BLUE SHIELD | Attending: Family Medicine | Admitting: Dietician

## 2019-05-17 ENCOUNTER — Other Ambulatory Visit: Payer: Self-pay

## 2019-05-17 ENCOUNTER — Encounter: Payer: Self-pay | Admitting: Dietician

## 2019-05-17 VITALS — Ht 65.0 in | Wt 180.7 lb

## 2019-05-17 DIAGNOSIS — E0590 Thyrotoxicosis, unspecified without thyrotoxic crisis or storm: Secondary | ICD-10-CM

## 2019-05-17 DIAGNOSIS — E785 Hyperlipidemia, unspecified: Secondary | ICD-10-CM | POA: Diagnosis not present

## 2019-05-17 DIAGNOSIS — I10 Essential (primary) hypertension: Secondary | ICD-10-CM | POA: Diagnosis not present

## 2019-05-17 DIAGNOSIS — M1A9XX0 Chronic gout, unspecified, without tophus (tophi): Secondary | ICD-10-CM

## 2019-05-17 NOTE — Patient Instructions (Signed)
   Plan for meals that contain a small portion of protein (nuts, egg, lowfat cheese, fish, chicken, or other lean meat not smoked) + some starch/ carb + generous portions of vegetables. Include fruit with a meal or as a dessert  Choose fruit and nuts for snacks most often  Check mrsdash.com for healthy low sodium recipe ideas.   Continue doing exercises for pain management; exercise helps fight inflammation, lower cholesterol, and control blood pressure.

## 2019-05-17 NOTE — Progress Notes (Signed)
Medical Nutrition Therapy: Visit start time: 0905  end time: 1020  Assessment:  Diagnosis: HTN, HLD, hyperthyroidism, gout Past medical history: endometriosis, history of hyperglycemia Psychosocial issues/ stress concerns: some increased stress recently  Preferred learning method:  . Auditory . Visual . Hands-on  Current weight: 180.7lbs  Height: 5'5" Medications, supplements: reconciled list in medical record  Progress and evaluation:   Patient reports weight fluctuation between 178 and 184 in recent years.   Had recent gout flare up 2 years after initial episode; has been avoiding beef and pork and [place] since then.  She states her BP has been well controlled with HCTZ;  Recent LDL cholesterol was 158, HDL 54.9, total cholesterol 238; she has decreased consumption of cheese and eggs to help improve blood lipids.   Physical activity: no structured or regular activity; occasional walking; started some online exercises for pain management yesterday; helps with childcare for twin grandchildren  Dietary Intake:  Usual eating pattern includes 2-3 meals and 0-2 snacks per day. Dining out frequency: 2-3 meals per week.  Breakfast: often skips due to time; dtr sometimes makes [place] sausage or kielbasa Snack: none Lunch: varies -- 9/9 [employer] chicken nuggets, fries, sandwich, lemonade; sometimes salad likes kale, but also gets tired of salads Snack: crackers or peanut butter, occ oreo cookies, occ chips, cheese doodles Supper: mostly chicken or salmon + veg + yellow rice or brown rice Snack: usually none, or same as pm Beverages: water, hot tea with lemon and honey, occ coffee (not daily), soda rarely  Nutrition Care Education: Topics covered: gout, hypertension, hyperlipidemia, hyperthyriodism, anti-inflammatory diet Basic nutrition: basic food groups, appropriate nutrient balance, appropriate meal and snack schedule, general nutrition guidelines for about 1400kcal daily intake with  50% CHO, 20% protein; balanced meal options  Weight control: benefits of weight control Advanced nutrition: cooking techniques, food label reading for added sugars Diabetes prevention: appropriate carb intake and balance Hypertension:  importance of controlling BP, identifying high sodium foods, identifying food sources of potassium, magnesium; seasoning alternatives for salt Hyperlipidemia:  target goals for lipids, healthy and unhealthy fats, importance of fiber including veg and fruits Gout, anti-inflammatory diet:  Emphasis on veg and fruit intake, limiting added sugars and processed starches, controlling portions of meats especially smoked and processed meats and healthy protein choices   Nutritional Diagnosis:  [place]-2.1 Inpaired nutrition utilization and [place]-2.2 Altered nutrition-related laboratory As related to gout, hyperthyroidism, hypertension, and hyperlipidemia.  As evidenced by patient with recent gout episode, hypertension and hyperthyroidism controlled with medicaion, and recent labs indicating elevated LDL and total cholesterol.  Intervention:   Instruction and discussion as noted above.   Patient has been working on diet changes to reduce purine intake and saturated fat intake.She is motivated to continue.  Established dietary goals for further improvement in blood lipids and hypertension and to avoid future gout episodes.   Education Materials given:  Marland Kitchen Guidelines for Gout . Gout handout from [facility] clinic . Plate Planner with food lists . Goals/ instructions   Learner/ who was taught:  . Patient   Level of understanding: Marland Kitchen Verbalizes/ demonstrates competency   Demonstrated degree of understanding via:   Teach back Learning barriers: . None   Willingness to learn/ readiness for change: . Eager, change in progress   Monitoring and Evaluation:  Dietary intake, exercise, HTN, blood lipids, and body weight      follow up: 07/03/19 at 12:15pm

## 2019-07-03 ENCOUNTER — Ambulatory Visit: Payer: BLUE CROSS/BLUE SHIELD | Admitting: Family Medicine

## 2019-07-03 ENCOUNTER — Encounter: Payer: BLUE CROSS/BLUE SHIELD | Attending: Family Medicine | Admitting: Dietician

## 2019-07-03 ENCOUNTER — Other Ambulatory Visit: Payer: Self-pay

## 2019-07-03 ENCOUNTER — Encounter: Payer: Self-pay | Admitting: Family Medicine

## 2019-07-03 ENCOUNTER — Encounter: Payer: Self-pay | Admitting: Dietician

## 2019-07-03 VITALS — BP 162/82 | HR 69 | Temp 96.2°F | Resp 15 | Ht 65.0 in | Wt 178.4 lb

## 2019-07-03 VITALS — Ht 65.0 in | Wt 177.7 lb

## 2019-07-03 DIAGNOSIS — I10 Essential (primary) hypertension: Secondary | ICD-10-CM | POA: Diagnosis not present

## 2019-07-03 DIAGNOSIS — M1A9XX0 Chronic gout, unspecified, without tophus (tophi): Secondary | ICD-10-CM | POA: Diagnosis not present

## 2019-07-03 DIAGNOSIS — E0590 Thyrotoxicosis, unspecified without thyrotoxic crisis or storm: Secondary | ICD-10-CM

## 2019-07-03 DIAGNOSIS — Z634 Disappearance and death of family member: Secondary | ICD-10-CM | POA: Diagnosis not present

## 2019-07-03 DIAGNOSIS — F419 Anxiety disorder, unspecified: Secondary | ICD-10-CM | POA: Diagnosis not present

## 2019-07-03 DIAGNOSIS — E785 Hyperlipidemia, unspecified: Secondary | ICD-10-CM

## 2019-07-03 DIAGNOSIS — R21 Rash and other nonspecific skin eruption: Secondary | ICD-10-CM

## 2019-07-03 DIAGNOSIS — L989 Disorder of the skin and subcutaneous tissue, unspecified: Secondary | ICD-10-CM

## 2019-07-03 LAB — CBC
HCT: 39.9 % (ref 36.0–46.0)
Hemoglobin: 13 g/dL (ref 12.0–15.0)
MCHC: 32.5 g/dL (ref 30.0–36.0)
MCV: 77 fl — ABNORMAL LOW (ref 78.0–100.0)
Platelets: 277 10*3/uL (ref 150.0–400.0)
RBC: 5.18 Mil/uL — ABNORMAL HIGH (ref 3.87–5.11)
RDW: 15.4 % (ref 11.5–15.5)
WBC: 4.7 10*3/uL (ref 4.0–10.5)

## 2019-07-03 LAB — COMPREHENSIVE METABOLIC PANEL
ALT: 20 U/L (ref 0–35)
AST: 18 U/L (ref 0–37)
Albumin: 4 g/dL (ref 3.5–5.2)
Alkaline Phosphatase: 84 U/L (ref 39–117)
BUN: 15 mg/dL (ref 6–23)
CO2: 29 mEq/L (ref 19–32)
Calcium: 11.1 mg/dL — ABNORMAL HIGH (ref 8.4–10.5)
Chloride: 103 mEq/L (ref 96–112)
Creatinine, Ser: 0.97 mg/dL (ref 0.40–1.20)
GFR: 71.06 mL/min (ref >60.00)
Glucose, Bld: 113 mg/dL — ABNORMAL HIGH (ref 70–99)
Potassium: 3.5 mEq/L (ref 3.5–5.1)
Sodium: 139 mEq/L (ref 135–145)
Total Bilirubin: 0.4 mg/dL (ref 0.2–1.2)
Total Protein: 7 g/dL (ref 6.0–8.3)

## 2019-07-03 MED ORDER — TRIAMCINOLONE ACETONIDE 0.1 % EX CREA: 1.0000 "application " | TOPICAL_CREAM | Freq: Two times a day (BID) | CUTANEOUS | 0 refills | Status: DC

## 2019-07-03 NOTE — Progress Notes (Signed)
Medical Nutrition Therapy: Visit start time: 1220  end time: 1250  Assessment:  Diagnosis: HTN, HLD, gout, hyperthyroidism Medical history changes: no changes Psychosocial issues/ stress concerns: high stress level in recent weeks due to mother's passing and assuming care for disabled brother  Current weight: 177.7lbs  Height: 5'5" Medications, supplement changes: reconciled list in medical record  Progress and evaluation:   No gout flare-ups since previous visit.  Some disruption to normal eating pattern due to illness and death of mother about 1 month ago.   Does have some chronic pain due to fall several years ago, usually mild.  She has included some nuts for snacks, but has not yet been able to increase fruits.   Physical activity: some daily stretches while sitting down, at home  Dietary Intake:  Usual eating pattern includes 2 meals and 2 snacks per day. Dining out frequency: increased frequency recently.  Breakfast: often fast food ie grilled chicken biscuit Snack: none Lunch: often just a snack ie popcorn, iced coffee Snack: none Supper: more restaurant meals due to increased stress; some fried foods, some salads, mostly chicken, had one small portion of lasagna, barbecue pork 2x Snack: limits sweets but does occasionally have cookies, 2 [employer] [place] or 5 oreos Beverages: water, hot tea unsweetened  Nutrition Care Education: Topics covered:   Weight control: reviewed progress since previous visit, determining reasonable weight loss rate/ goal, sources of extra calories especially fat, sugar in restaurant meals as well as large food portions Advanced nutrition: dining out Hypertension:  Effects of weight control, physical activity, stress management Hyperlipidemia:  healthy and unhealthy fats, appropriate food choices, role of weight, exercise, stress   Nutritional Diagnosis:  [place]-2.1 Inpaired nutrition utilization and [place]-2.2 Altered nutrition-related laboratory As  related to gout, hyperthyroidism, hypertension, and hyperlipidemia.  As evidenced by patient with medication-controlled hypertension and hyperthyroidism and gout, recent labwork indicating elevated LDL and total cholesterol.  Intervention:   Reviewed progress since previous visit.  Discussion and instruction as noted above.  Patient is motivated and ready to resume efforts to improve health and lose weight after time of increased personal stress.   Updated nutrition goals with direction from patient.   No additional follow-up scheduled at this time; patient will schedule later if needed.   Education Materials given:  Marland Kitchen Exercise booklet . Goals/ instructions   Learner/ who was taught:  . Patient    Level of understanding: Marland Kitchen Verbalizes/ demonstrates competency   Demonstrated degree of understanding via:   Teach back Learning barriers: . None  Willingness to learn/ readiness for change: . Eager, change in progress  Monitoring and Evaluation:  Dietary intake, exercise, BP, gout, and thyroid control, blood lipids, and body weight      follow up: prn

## 2019-07-03 NOTE — Patient Instructions (Signed)
   Resume more home meals instead of restaurant meals, such as yogurt with fruit and nuts or granola for breakfast, grilled or baked chicken or fish with whole grain rice (try Minute Rice and Quinoa blend) or yellow rice is OK although not whole grain, and vegetables, etc.   Follow eating pattern of meal or snack every 3-5 hours during the day.   Continue with stretching exercises, resume some walking as able with time.

## 2019-07-03 NOTE — Progress Notes (Signed)
Subjective:    Patient ID: Elizabeth [name], female    DOB: Nov 28, 1960, 58 y.o.   MRN: [id number]  HPI   Patient presents to clinic for follow up on HTN, hyperthyroid and cholesterol.    Patient has had a lot of difficulty in the past few months.  Her mother was ill, she was her caretaker and her mother passed away about 1 month ago.  Patient reports feeling anxious and stressed and believes it is all related to her mother's passing.  Denies SI or HI.  Has good support from family members and friends.  Is just been difficult to deal with all of the changes in the loss of her mother.  Blood pressure has been stable on current medicines.  She finds her stomach feels better if she takes after eating in the morning, so did not take yet this a.m. because she has not eaten breakfast.  When she does take it blood pressures usually run in the 130s to 140s over 80s.  Denies chest pain, shortness of breath, wheezing, palpitations or lower extremity swelling  Feels well on current PTU dose.  We did do a referral to endocrinology previously, but patient had to cancel due to mother being ill.  We will do a new referral.  We will follow-up on cholesterol levels today with new labs.  Patient complains of a rash on left forearm, it is red and slightly itchy.  Denies any new soaps, lotions or detergents.  Unsure what it could be from.  Has not put anything on the rash to see if it will respond to topical treatment.  Also reports a skin lesion on right side of neck, keeps playing and picking with.  No she should not.  States the area is very annoying would like it removed if possible.  Patient Active Problem List   Diagnosis Date Noted  . Hyperlipidemia 03/19/2019  . 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 Essential hypertension 01/12/2019  . Hyperthyroidism 01/12/2019  . Neuropathy 01/12/2019   Social History   Tobacco Use  . Smoking status: Former Smoker    Quit date: 01/12/1999    Years since  quitting: 20.4  . Smokeless tobacco: Never Used  Substance Use Topics  . Alcohol use: Not Currently    Comment: ocassionally   Review of Systems  Constitutional: Negative for chills, fatigue and fever.  HENT: Negative for congestion, ear pain, sinus pain and sore throat.   Eyes: Negative.   Respiratory: Negative for cough, shortness of breath and wheezing.   Cardiovascular: Negative for chest pain, palpitations and leg swelling.  Gastrointestinal: Negative for abdominal pain, diarrhea, nausea and vomiting.  Genitourinary: Negative for dysuria, frequency and urgency.  Musculoskeletal: Negative for arthralgias and myalgias.  Skin: +rash left forearm, +skin lesion on neck Neurological: Negative for syncope, light-headedness and headaches.  Psychiatric/Behavioral: The patient is not nervous/anxious.       Objective:   Physical Exam Vitals signs and nursing note reviewed.  Constitutional:      General: She is not in acute distress.    Appearance: She is not ill-appearing, toxic-appearing or diaphoretic.  HENT:     Head: Normocephalic and atraumatic.  Eyes:     General: No scleral icterus.    Extraocular Movements: Extraocular movements intact.     Conjunctiva/sclera: Conjunctivae normal.     Pupils: Pupils are equal, round, and reactive to light.  Neck:     Musculoskeletal: Normal range of motion and neck supple. No neck rigidity  or muscular tenderness.  Cardiovascular:     Rate and Rhythm: Normal rate and regular rhythm.  Pulmonary:     Effort: Pulmonary effort is normal. No respiratory distress.     Breath sounds: Normal breath sounds.  Skin:    General: Skin is warm and dry.     Findings: Lesion (small skin lesion on right side of neck, ?skin tag) and rash (faint rash on left forearm, about size of nickel) present.  Neurological:     Mental Status: She is alert and oriented to person, place, and time.     Gait: Gait normal.  Psychiatric:        Behavior: Behavior normal.         Thought Content: Thought content normal.        Judgment: Judgment normal.     Comments: Seems a bit down, but trying to keep self in good spirits. Denies SI or HI. Misses her mom, but knows she is in better place.     Today's Vitals   07/03/19 0908  BP: (!) 162/82  Pulse: 69  Resp: 15  Temp: (!) 96.2 F (35.7 C)  TempSrc: Temporal  SpO2: 97%  Weight: 178 lb 6.4 oz (80.9 kg)  Height: _0  (1.651 m)   Body mass index is 29.69 kg/m.    Assessment & Plan:    Essential hypertension - Plan: CBC, Comp Met (CMET)  Hyperthyroidism - Plan: Ambulatory referral to Endocrinology, Thyroid Panel With TSH  Hyperlipidemia, unspecified hyperlipidemia type  Bereavement - Plan: Ambulatory referral to Psychology  Anxiety - Plan: Ambulatory referral to Psychology, VITAMIN D 25 Hydroxy (Vit-D Deficiency, Fractures), B12 and Folate Panel  Skin lesion of neck - Plan: Ambulatory referral to Dermatology  Rash and nonspecific skin eruption - Plan: triamcinolone cream (KENALOG) 0.1 %  We will get new blood work in clinic today.  She will continue current medications at this time.  We will do new referral to endocrinology for hyperthyroidism recheck thyroid panel.  She will get lipid panel rechecked.  Patient agreeable to referral to counseling due to anxiety and bereavement, patient does not want to start any medications.  Patient given handout outlining anxiety coping mechanisms, recommended patient try to get good sleep, do regular physical activity, eat well and keep up good water intake.  Also encouraged her to do a hobbies she enjoys like reading or spending time with friends.  We will refer to dermatology for lesion on neck and also we will do mild steroid cream to treat rash on forearm.   She will follow up regularly every 3 months for recheck on chronic medical conditions.

## 2019-07-03 NOTE — Patient Instructions (Signed)

## 2019-07-04 LAB — THYROID PANEL WITH TSH
Free Thyroxine Index: 2.5 (ref 1.4–3.8)
T3 Uptake: 27 % (ref 22–35)
T4, Total: 9.1 ug/dL (ref 5.1–11.9)
TSH: 1.76 mIU/L (ref 0.40–4.50)

## 2019-07-05 LAB — VITAMIN D 25 HYDROXY (VIT D DEFICIENCY, FRACTURES): VITD: 52.88 ng/mL (ref 30.00–100.00)

## 2019-07-05 LAB — B12 AND FOLATE PANEL
Folate: 16.6 ng/mL (ref >5.9)
Vitamin B-12: 526 pg/mL (ref 211–911)

## 2019-07-06 ENCOUNTER — Telehealth: Payer: Self-pay

## 2019-07-06 DIAGNOSIS — Z634 Disappearance and death of family member: Secondary | ICD-10-CM

## 2019-07-06 DIAGNOSIS — F419 Anxiety disorder, unspecified: Secondary | ICD-10-CM

## 2019-07-06 MED ORDER — HYDROXYZINE HCL 10 MG PO TABS: 10.0000 mg | ORAL_TABLET | Freq: Three times a day (TID) | ORAL | 2 refills | Status: DC | PRN

## 2019-07-06 NOTE — Telephone Encounter (Signed)
Copied from [place] [id number]. Topic: General - Other >> Jul 06, 2019  1:19 PM Carolyn Stare wrote: Pt call to let Philis Nettle know that per there last conversation she has decided that she does need something for her anxiety  [place] and [place]

## 2019-07-06 NOTE — Telephone Encounter (Signed)
Patient is aware 

## 2019-07-06 NOTE — Telephone Encounter (Signed)
Rx for as needed medicine sent in to [facility]

## 2019-07-19 DIAGNOSIS — D485 Neoplasm of uncertain behavior of skin: Secondary | ICD-10-CM | POA: Diagnosis not present

## 2019-07-19 DIAGNOSIS — B079 Viral wart, unspecified: Secondary | ICD-10-CM | POA: Diagnosis not present

## 2019-07-19 DIAGNOSIS — L853 Xerosis cutis: Secondary | ICD-10-CM | POA: Diagnosis not present

## 2019-08-09 ENCOUNTER — Other Ambulatory Visit: Payer: Self-pay

## 2019-08-09 ENCOUNTER — Telehealth: Payer: Self-pay

## 2019-08-09 DIAGNOSIS — E0590 Thyrotoxicosis, unspecified without thyrotoxic crisis or storm: Secondary | ICD-10-CM

## 2019-08-09 DIAGNOSIS — I10 Essential (primary) hypertension: Secondary | ICD-10-CM

## 2019-08-09 MED ORDER — PROPRANOLOL HCL 20 MG PO TABS: ORAL_TABLET | ORAL | 0 refills | Status: DC

## 2019-08-09 NOTE — Telephone Encounter (Signed)
The patient needs to see an endocrinologist for long term refills of this medication. Please find out if she has been seeing an endocrinologist locally. If she has not then I can refill one time and she will have to see endocrinology. If she has been seeing endocrinology she should request a refill from them.

## 2019-08-09 NOTE — Telephone Encounter (Signed)
Patient is wanting a refill of Propylthiouracil. Please advise.

## 2019-08-10 MED ORDER — PROPYLTHIOURACIL 50 MG PO TABS: ORAL_TABLET | ORAL | 0 refills | Status: DC

## 2019-08-10 NOTE — Addendum Note (Signed)
Addended by: Leone Haven on: 08/10/2019 02:41 PM   Modules accepted: Orders

## 2019-08-10 NOTE — Telephone Encounter (Signed)
Patient has not seen endocrinologist for a couple years. Also she would like referral to one. She understands that you are only filling it once. She must establish with ENDO for medication.

## 2019-08-10 NOTE — Telephone Encounter (Signed)
Referral placed. Refill sent to pharmacy.

## 2019-08-22 ENCOUNTER — Other Ambulatory Visit: Payer: Self-pay

## 2019-08-22 ENCOUNTER — Encounter: Payer: Self-pay | Admitting: Internal Medicine

## 2019-08-22 ENCOUNTER — Ambulatory Visit (INDEPENDENT_AMBULATORY_CARE_PROVIDER_SITE_OTHER): Payer: BLUE CROSS/BLUE SHIELD | Admitting: Internal Medicine

## 2019-08-22 DIAGNOSIS — O10013 Pre-existing essential hypertension complicating pregnancy, third trimester: Secondary | ICD-10-CM | POA: Diagnosis not present

## 2019-08-22 DIAGNOSIS — R7301 Impaired fasting glucose: Secondary | ICD-10-CM

## 2019-08-22 DIAGNOSIS — M25512 Pain in left shoulder: Secondary | ICD-10-CM

## 2019-08-22 DIAGNOSIS — I10 Essential (primary) hypertension: Secondary | ICD-10-CM

## 2019-08-22 DIAGNOSIS — G8929 Other chronic pain: Secondary | ICD-10-CM

## 2019-08-22 MED ORDER — DICLOFENAC SODIUM 75 MG PO TBEC: 75.0000 mg | DELAYED_RELEASE_TABLET | Freq: Two times a day (BID) | ORAL | 0 refills | Status: DC

## 2019-08-22 MED ORDER — METHOCARBAMOL 500 MG PO TABS: 500.0000 mg | ORAL_TABLET | Freq: Four times a day (QID) | ORAL | 1 refills | Status: DC

## 2019-08-22 NOTE — Assessment & Plan Note (Signed)
Not well controlled.  RN VISIT AND urine microalbumin needed

## 2019-08-22 NOTE — Progress Notes (Signed)
Virtual Visit via [email or web address]  This visit type was conducted due to national recommendations for restrictions regarding the COVID-19 pandemic (e.g. social distancing).  This format is felt to be most appropriate for this patient at this time.  All issues noted in this document were discussed and addressed.  No physical exam was performed (except for noted visual exam findings with Video Visits).   I connected with@ on 08/22/19 at  8:00 AM EST by a video enabled telemedicine application and verified that I am speaking with the correct person using two identifiers. Location patient: home Location provider: work or home office Persons participating in the virtual visit: patient, provider  I discussed the limitations, risks, security and privacy concerns of performing an evaluation and management service by telephone and the availability of in person appointments. I also discussed with the patient that there may be a patient responsible charge related to this service. The patient expressed understanding and agreed to proceed.  Reason for visit: left arm pain   HPI:  59 yr old female with history of chronic gout, hyperthyroidism and uncontrolled hypertension presents with pain involving left arm  1) left shoulder pain : . Arm feels heavy,  Hurts in top of shoulder,  Limited ROM due to pain,  Feels weaker than left  trapzeaius muscle spasm  Left hand numb.  Bra strop noted to cut into her shoulder   2) uncontrolled hypertension . Currently taking hctz and propranolol  Home readings  Have not been checked as she does not have a BP monitor   3) Reviewed labs fro [facility].  Calcium has been elevated for the past year   ROS: See pertinent positives and negatives per HPI.  Past Medical History:  Diagnosis Date  . Gout   . Hypertension   . Thyroid disease     Past Surgical History:  Procedure Laterality Date  . ENDOMETRIAL ABLATION  2010    Family History  Problem Relation Age of Onset  .  Hypertension Mother   . Heart disease Mother   . Hypertension Father     SOCIAL HX:  reports that she quit smoking about 20 years ago. She has never used smokeless tobacco. She reports previous alcohol use. She reports that she does not use drugs.   Current Outpatient Medications:  .  cholecalciferol (VITAMIN D3) 25 MCG (1000 UT) tablet, Take 1,000 Units by mouth daily., Disp: , Rfl:  .  colchicine 0.6 MG tablet, Take 2 tablets by mouth at onset of gout flare, then in 1 hour take 1 tablet by mouth., Disp: 3 tablet, Rfl: 0 .  FLUTICASONE PROPIONATE NA, Place into the nose., Disp: , Rfl:  .  hydrochlorothiazide (HYDRODIURIL) 25 MG tablet, Take 1 tablet (25 mg total) by mouth daily., Disp: 90 tablet, Rfl: 1 .  hydrOXYzine (ATARAX/VISTARIL) 10 MG tablet, Take 1 tablet (10 mg total) by mouth 3 (three) times daily as needed for anxiety. THIS MEDICATION CAN CAUSE DROWSINESS., Disp: 30 tablet, Rfl: 2 .  Multiple Vitamins-Minerals (MULTIVITAMIN ADULT PO), Take by mouth., Disp: , Rfl:  .  propranolol (INDERAL) 20 MG tablet, TAKE 1 TABLET(20 MG) BY MOUTH THREE TIMES DAILY, Disp: 270 tablet, Rfl: 0 .  propylthiouracil (PTU) 50 MG tablet, TAKE 1 TABLET(50 MG) BY MOUTH THREE TIMES DAILY, Disp: 270 tablet, Rfl: 0 .  triamcinolone cream (KENALOG) 0.1 %, Apply 1 application topically 2 (two) times daily. Use thin layer on rash area. No more than 7 days at a time (can discolor skin  with longer use), Disp: 30 g, Rfl: 0 .  diclofenac (VOLTAREN) 75 MG EC tablet, Take 1 tablet (75 mg total) by mouth 2 (two) times daily., Disp: 30 tablet, Rfl: 0 .  methocarbamol (ROBAXIN) 500 MG tablet, Take 1 tablet (500 mg total) by mouth 4 (four) times daily., Disp: 60 tablet, Rfl: 1  EXAM:  VITALS per patient if applicable:  GENERAL: alert, oriented, appears well and in no acute distress  HEENT: atraumatic, conjunttiva clear, no obvious abnormalities on inspection of external nose and ears  NECK: normal movements of the  head and neck  LUNGS: on inspection no signs of respiratory distress, breathing rate appears normal, no obvious gross SOB, gasping or wheezing  CV: no obvious cyanosis  MS: moves all visible extremities with limited ROM left arm due to pain with abduction and flexion   PSYCH/NEURO: pleasant and cooperative, no obvious depression or anxiety, speech and thought processing grossly intact  ASSESSMENT AND PLAN:  Discussed the following assessment and plan:  Hypercalcemia - Plan: Calcium, ionized, PTH, Intact and Calcium, PTH-Related Peptide  Essential hypertension affecting pregnancy in third trimester - Plan: Microalbumin / creatinine urine ratio  Impaired fasting glucose - Plan: Hemoglobin A1c  Essential hypertension  Chronic left shoulder pain  Essential hypertension Not well controlled.  RN VISIT AND urine microalbumin needed   Hypercalcemia Chronic, no prior workup although patient recalls being told about it prior to relocating from [place] .  Workup in progress.   Chronic left shoulder pain etiology may be multifactorial.  Tendonitis unlikely given lack of preciptating event.   Neck pain ,  Hand numbness.  Bra strap cutting into shoulder all suggest peripheral l neuroapthy cominng from cervical spine,  Brachial plexus,  Or additional compenent of CTS .  She is  Intolerant of gabapentin   Trial of diclofenac and  Robaxin .  Face to dae eval needed     I discussed the assessment and treatment plan with the patient. The patient was provided an opportunity to ask questions and all were answered. The patient agreed with the plan and demonstrated an understanding of the instructions.   The patient was advised to call back or seek an in-person evaluation if the symptoms worsen or if the condition fails to improve as anticipated.  I provided  25 minutes of non-face-to-face time during this encounter reviewing patient's current problems and past procedures/imaging studies, providing  counseling on the above mentioned problems , and coordination  of care .   Crecencio Mc, MD

## 2019-08-22 NOTE — Assessment & Plan Note (Signed)
etiology may be multifactorial.  Tendonitis unlikely given lack of preciptating event.   Neck pain ,  Hand numbness.  Bra strap cutting into shoulder all suggest peripheral l neuroapthy cominng from cervical spine,  Brachial plexus,  Or additional compenent of CTS .  She is  Intolerant of gabapentin   Trial of diclofenac and  Robaxin .  Face to dae eval needed

## 2019-08-22 NOTE — Assessment & Plan Note (Signed)
Chronic, no prior workup although patient recalls being told about it prior to relocating from [place] .  Workup in progress.

## 2019-08-23 ENCOUNTER — Other Ambulatory Visit (INDEPENDENT_AMBULATORY_CARE_PROVIDER_SITE_OTHER): Payer: BLUE CROSS/BLUE SHIELD

## 2019-08-23 ENCOUNTER — Ambulatory Visit: Payer: BLUE CROSS/BLUE SHIELD

## 2019-08-23 VITALS — BP 160/90

## 2019-08-23 DIAGNOSIS — O10013 Pre-existing essential hypertension complicating pregnancy, third trimester: Secondary | ICD-10-CM | POA: Diagnosis not present

## 2019-08-23 DIAGNOSIS — I10 Essential (primary) hypertension: Secondary | ICD-10-CM

## 2019-08-23 DIAGNOSIS — R7301 Impaired fasting glucose: Secondary | ICD-10-CM | POA: Diagnosis not present

## 2019-08-23 DIAGNOSIS — E210 Primary hyperparathyroidism: Secondary | ICD-10-CM

## 2019-08-23 NOTE — Progress Notes (Addendum)
Pt presented today for a blood pressure check. Pt is currently taking HCTZ 25 mg once daily in the morning. The blood pressure in the left arm was 158/88 pulse 60 and in the right arm 160/90 pulse 58. Pt stated that she was not having any symptoms. Pt was advised that she was good to go and we would speak with the doctor in regards to her readings. She was also advised that if she developed any dizziness, headache, or chest pain that she would need to be evaluated.   In the setting of hypercalcemia  Will stop hydrochlorothiazide and use furosemide 20 mg daily.  Adding losartan 100 mg daily in the eveing for BP control.  Referring to Endocrinology for hyperparathyroidism workup  Regards,   [name], MD

## 2019-08-23 NOTE — Progress Notes (Signed)
Reviewed information.  Blood pressure elevated.  Per discussion with Janett Billow, she also sent copy of this to Dr Derrel Nip.  Will need medication adjustment to control blood pressure.  Reviewed chart.  W/up in progress for elevated calcium.  Will d/w Dr Derrel Nip regarding plan for further treatment.    Dr Nicki Reaper

## 2019-08-24 LAB — MICROALBUMIN / CREATININE URINE RATIO
Creatinine,U: 65 mg/dL
Microalb Creat Ratio: 5.1 mg/g (ref 0.0–30.0)
Microalb, Ur: 3.3 mg/dL — ABNORMAL HIGH (ref 0.0–1.9)

## 2019-08-24 LAB — HEMOGLOBIN A1C: Hgb A1c MFr Bld: 5.7 % (ref 4.6–6.5)

## 2019-08-28 ENCOUNTER — Telehealth: Payer: Self-pay | Admitting: Internal Medicine

## 2019-08-28 ENCOUNTER — Other Ambulatory Visit: Payer: Self-pay | Admitting: Internal Medicine

## 2019-08-28 DIAGNOSIS — I10 Essential (primary) hypertension: Secondary | ICD-10-CM

## 2019-08-28 DIAGNOSIS — E210 Primary hyperparathyroidism: Secondary | ICD-10-CM

## 2019-08-28 LAB — PTH-RELATED PEPTIDE: PTH-Related Protein (PTH-RP): 9 pg/mL — ABNORMAL LOW (ref 14–27)

## 2019-08-28 LAB — PTH, INTACT AND CALCIUM
Calcium: 11.7 mg/dL — ABNORMAL HIGH (ref 8.6–10.4)
PTH: 59 pg/mL (ref 14–64)

## 2019-08-28 LAB — CALCIUM, IONIZED: Calcium, Ion: 6.81 mg/dL — ABNORMAL HIGH (ref 4.8–5.6)

## 2019-08-28 MED ORDER — LOSARTAN POTASSIUM 100 MG PO TABS: 100.0000 mg | ORAL_TABLET | Freq: Every day | ORAL | 3 refills | Status: DC

## 2019-08-28 MED ORDER — FUROSEMIDE 20 MG PO TABS: 20.0000 mg | ORAL_TABLET | Freq: Every day | ORAL | 1 refills | Status: DC

## 2019-08-28 NOTE — Assessment & Plan Note (Signed)
Labs suggest primary hyperPTH.  Stopping hydrochlorothiazide,  Starting furosemide.  ordering DEXA,  Urinary calcium,  Vitamin D, and endocrine referral in process

## 2019-08-28 NOTE — Assessment & Plan Note (Addendum)
Not at goal on hydrochlorothiazide  Alone.  Given hypercalcemia,  Will stop hydrochlorothiazide and start furosemide and losartan .  RN visit and lab visit needed in one week

## 2019-08-28 NOTE — Addendum Note (Signed)
Addended by: Crecencio Mc on: 08/28/2019 07:45 PM   Modules accepted: Orders

## 2019-08-28 NOTE — Telephone Encounter (Signed)
patient   needs an RN visit for repeat BP check after medication change, and a non fasting lab appt, both  in one week

## 2019-08-29 ENCOUNTER — Other Ambulatory Visit: Payer: Self-pay

## 2019-09-02 ENCOUNTER — Telehealth: Payer: Self-pay | Admitting: Internal Medicine

## 2019-09-03 NOTE — Telephone Encounter (Signed)
Pt has seen the mychart message and is aware of changes and referral.

## 2019-09-04 ENCOUNTER — Other Ambulatory Visit: Payer: Self-pay

## 2019-09-04 ENCOUNTER — Other Ambulatory Visit (INDEPENDENT_AMBULATORY_CARE_PROVIDER_SITE_OTHER): Payer: BLUE CROSS/BLUE SHIELD

## 2019-09-04 ENCOUNTER — Ambulatory Visit (INDEPENDENT_AMBULATORY_CARE_PROVIDER_SITE_OTHER): Payer: BLUE CROSS/BLUE SHIELD

## 2019-09-04 VITALS — BP 122/82 | HR 62 | Temp 96.5°F

## 2019-09-04 DIAGNOSIS — I10 Essential (primary) hypertension: Secondary | ICD-10-CM

## 2019-09-04 NOTE — Progress Notes (Signed)
Patient came in today for BP check. BP reading was 122/82 P 62. Patient stated that she had d/c the HCTZ as instructed. She was taking her losartan 100mg  as well as propanalol 20mg .

## 2019-09-05 ENCOUNTER — Ambulatory Visit (INDEPENDENT_AMBULATORY_CARE_PROVIDER_SITE_OTHER): Payer: BLUE CROSS/BLUE SHIELD | Admitting: Internal Medicine

## 2019-09-05 ENCOUNTER — Other Ambulatory Visit: Payer: Self-pay

## 2019-09-05 ENCOUNTER — Encounter: Payer: Self-pay | Admitting: Internal Medicine

## 2019-09-05 VITALS — BP 150/70 | HR 86 | Temp 95.6°F | Resp 15 | Ht 65.0 in | Wt 182.4 lb

## 2019-09-05 DIAGNOSIS — G629 Polyneuropathy, unspecified: Secondary | ICD-10-CM | POA: Diagnosis not present

## 2019-09-05 DIAGNOSIS — I10 Essential (primary) hypertension: Secondary | ICD-10-CM | POA: Diagnosis not present

## 2019-09-05 DIAGNOSIS — E0590 Thyrotoxicosis, unspecified without thyrotoxic crisis or storm: Secondary | ICD-10-CM | POA: Diagnosis not present

## 2019-09-05 LAB — BASIC METABOLIC PANEL
BUN: 16 mg/dL (ref 6–23)
CO2: 25 mEq/L (ref 19–32)
Calcium: 11 mg/dL — ABNORMAL HIGH (ref 8.4–10.5)
Chloride: 106 mEq/L (ref 96–112)
Creatinine, Ser: 0.92 mg/dL (ref 0.40–1.20)
GFR: 75.49 mL/min (ref >60.00)
Glucose, Bld: 77 mg/dL (ref 70–99)
Potassium: 3.8 mEq/L (ref 3.5–5.1)
Sodium: 139 mEq/L (ref 135–145)

## 2019-09-05 LAB — VITAMIN D 25 HYDROXY (VIT D DEFICIENCY, FRACTURES): VITD: 40.54 ng/mL (ref 30.00–100.00)

## 2019-09-05 LAB — CALCIUM / CREATININE RATIO, URINE
CALCIUM, RANDOM URINE: 4.6 mg/dL
CALCIUM/CREATININE RATIO: 46 mg/g creat (ref 10–320)
Creatinine, Urine: 100 mg/dL (ref 20–275)

## 2019-09-05 NOTE — Progress Notes (Signed)
Subjective:  Patient ID: Elizabeth [name], female    DOB: 1961-08-26  Age: 58 y.o. MRN: [id number]  CC: The primary encounter diagnosis was Neuropathy. Diagnoses of Hyperthyroidism, Hypercalcemia, and Essential hypertension were also pertinent to this visit.  HPI Doyle Askelson presents for follow up on multiple issues: hypertension, hypercalcemia , bilateral u/e neuropathy, back pain   This visit occurred during the SARS-CoV-2 public health emergency.  Safety protocols were in place, including screening questions prior to the visit, additional usage of staff PPE, and extensive cleaning of exam room while observing appropriate contact time as indicated for disinfecting solutions.   Patient tolerating furosemide and losartan  Started on dec 22,  BP yesterday was 120/70 at RN visit  but much more elevated today. Takes losartan at night.  Has been taking diclofenac every other night,  last dose was yesterday evening.   bilateral shoulder/arm neuropathy ,  Right worse than left.  Tingling,  Burning,  Followed by aching.  Chronic .   Neurology referral needed     Thoracic back pain  Chronic, present Since the fall on the  [place] that also fractured the right wrist .    Outpatient Medications Prior to Visit  Medication Sig Dispense Refill  . cholecalciferol (VITAMIN D3) 25 MCG (1000 UT) tablet Take 1,000 Units by mouth daily.    . colchicine 0.6 MG tablet Take 2 tablets by mouth at onset of gout flare, then in 1 hour take 1 tablet by mouth. 3 tablet 0  . diclofenac (VOLTAREN) 75 MG EC tablet Take 1 tablet (75 mg total) by mouth 2 (two) times daily. 30 tablet 0  . FLUTICASONE PROPIONATE NA Place into the nose.    . furosemide (LASIX) 20 MG tablet Take 1 tablet (20 mg total) by mouth daily. 90 tablet 1  . losartan (COZAAR) 100 MG tablet Take 1 tablet (100 mg total) by mouth daily. 90 tablet 3  . methocarbamol (ROBAXIN) 500 MG tablet Take 1 tablet (500 mg total) by mouth 4 (four) times daily. 60  tablet 1  . Multiple Vitamins-Minerals (MULTIVITAMIN ADULT PO) Take by mouth.    . propranolol (INDERAL) 20 MG tablet TAKE 1 TABLET(20 MG) BY MOUTH THREE TIMES DAILY 270 tablet 0  . hydrOXYzine (ATARAX/VISTARIL) 10 MG tablet Take 1 tablet (10 mg total) by mouth 3 (three) times daily as needed for anxiety. THIS MEDICATION CAN CAUSE DROWSINESS. (Patient not taking: Reported on 09/06/2019) 30 tablet 2  . propylthiouracil (PTU) 50 MG tablet TAKE 1 TABLET(50 MG) BY MOUTH THREE TIMES DAILY 270 tablet 0  . triamcinolone cream (KENALOG) 0.1 % Apply 1 application topically 2 (two) times daily. Use thin layer on rash area. No more than 7 days at a time (can discolor skin with longer use) (Patient not taking: Reported on 09/06/2019) 30 g 0  . hydrochlorothiazide (HYDRODIURIL) 25 MG tablet Take 1 tablet (25 mg total) by mouth daily. (Patient not taking: Reported on 09/05/2019) 90 tablet 1   No facility-administered medications prior to visit.    Review of Systems;  Patient denies headache, fevers, malaise, unintentional weight loss, skin rash, eye pain, sinus congestion and sinus pain, sore throat, dysphagia,  hemoptysis , cough, dyspnea, wheezing, chest pain, palpitations, orthopnea, edema, abdominal pain, nausea, melena, diarrhea, constipation, flank pain, dysuria, hematuria, urinary  Frequency, nocturia, numbness, tingling, seizures,  Focal weakness, Loss of consciousness,  Tremor, insomnia, depression, anxiety, and suicidal ideation.      Objective:  BP (!) 150/70 (BP Location: Left  Arm, Patient Position: Sitting, Cuff Size: Large)   Pulse 86   Temp (!) 95.6 F (35.3 C) (Temporal)   Resp 15   Ht 5\' 5"  (1.651 m)   Wt 182 lb 6.4 oz (82.7 kg)   SpO2 98%   BMI 30.35 kg/m   BP Readings from Last 3 Encounters:  09/06/19 (!) 148/78  09/05/19 (!) 150/70  09/04/19 122/82    Wt Readings from Last 3 Encounters:  09/06/19 182 lb 3.2 oz (82.6 kg)  09/05/19 182 lb 6.4 oz (82.7 kg)  08/22/19 177 lb  (80.3 kg)    General appearance: alert, cooperative and appears stated age Ears: normal TM's and external ear canals both ears Throat: lips, mucosa, and tongue normal; teeth and gums normal Neck: no adenopathy, no carotid bruit, supple, symmetrical, trachea midline and thyroid not enlarged, symmetric, no tenderness/mass/nodules Back: symmetric, no curvature. ROM normal. No CVA tenderness. Lungs: clear to auscultation bilaterally Heart: regular rate and rhythm, S1, S2 normal, no murmur, click, rub or gallop Abdomen: soft, non-tender; bowel sounds normal; no masses,  no organomegaly Pulses: 2+ and symmetric Skin: Skin color, texture, turgor normal. No rashes or lesions Lymph nodes: Cervical, supraclavicular, and axillary nodes normal.  Lab Results  Component Value Date   HGBA1C 5.7 08/23/2019   HGBA1C 5.8 01/23/2019    Lab Results  Component Value Date   CREATININE 0.92 09/04/2019   CREATININE 0.97 07/03/2019   CREATININE 0.93 02/27/2019    Lab Results  Component Value Date   WBC 5.5 09/06/2019   HGB 12.8 09/06/2019   HCT 39.0 09/06/2019   PLT 296.0 09/06/2019   GLUCOSE 77 09/04/2019   CHOL 238 (H) 02/27/2019   TRIG 125.0 02/27/2019   HDL 54.90 02/27/2019   LDLCALC 158 (H) 02/27/2019   ALT 20 07/03/2019   AST 18 07/03/2019   NA 139 09/04/2019   K 3.8 09/04/2019   CL 106 09/04/2019   CREATININE 0.92 09/04/2019   BUN 16 09/04/2019   CO2 25 09/04/2019   TSH 1.15 09/06/2019   HGBA1C 5.7 08/23/2019   MICROALBUR 3.3 (H) 08/23/2019    US BREAST LTD UNI RIGHT INC AXILLA  Result Date: 05/10/2019 CLINICAL DATA:  The patient returns after screening study for evaluation of possible RIGHT breast mass and possible asymmetry in the LEFT breast. EXAM: DIGITAL DIAGNOSTIC BILATERAL MAMMOGRAM WITH CAD AND TOMO ULTRASOUND RIGHT BREAST COMPARISON:  04/09/2019 and earlier studies ACR Breast Density Category b: There are scattered areas of fibroglandular density. FINDINGS: Right breast:  Mammogram: Spot compression views confirm presence of a circumscribed mass in the [place] of the RIGHT breast, only seen on oblique and true LATERAL projections. Mammographic images were processed with CAD. Ultrasound: Targeted ultrasound is performed, showing a small group of microcysts in the 2 o'clock location of the RIGHT breast 7 centimeters from the nipple which measures 0.9 x 0.3 x 0.5 centimeters. Findings favor fibrocystic changes/apocrine metaplasia. Left breast: Mammogram: Additional 2-D and 3-D images are performed. No persistent abnormality identified in the UPPER portion of the LEFT breast. No suspicious mass, distortion, or microcalcifications are identified to suggest presence of malignancy. Mammographic images were processed with CAD. IMPRESSION: 1. Area of probable fibrocystic changes or after a metaplasia in the UPPER INNER QUADRANT of the RIGHT breast. Recommend follow-up to document stability. 2. No persistent abnormality in the LEFT breast. RECOMMENDATION: RIGHT diagnostic mammogram and RIGHT breast ultrasound in 6 months. I have discussed the findings and recommendations with the patient. If applicable,  a reminder letter will be sent to the patient regarding the next appointment. BI-RADS CATEGORY  3: Probably benign. Electronically Signed   By: Nolon Nations M.D.   On: 05/10/2019 15:17   MM DIAG BREAST TOMO BILATERAL  Result Date: 05/10/2019 CLINICAL DATA:  The patient returns after screening study for evaluation of possible RIGHT breast mass and possible asymmetry in the LEFT breast. EXAM: DIGITAL DIAGNOSTIC BILATERAL MAMMOGRAM WITH CAD AND TOMO ULTRASOUND RIGHT BREAST COMPARISON:  04/09/2019 and earlier studies ACR Breast Density Category b: There are scattered areas of fibroglandular density. FINDINGS: Right breast: Mammogram: Spot compression views confirm presence of a circumscribed mass in the [place] of the RIGHT breast, only seen on oblique and true  LATERAL projections. Mammographic images were processed with CAD. Ultrasound: Targeted ultrasound is performed, showing a small group of microcysts in the 2 o'clock location of the RIGHT breast 7 centimeters from the nipple which measures 0.9 x 0.3 x 0.5 centimeters. Findings favor fibrocystic changes/apocrine metaplasia. Left breast: Mammogram: Additional 2-D and 3-D images are performed. No persistent abnormality identified in the UPPER portion of the LEFT breast. No suspicious mass, distortion, or microcalcifications are identified to suggest presence of malignancy. Mammographic images were processed with CAD. IMPRESSION: 1. Area of probable fibrocystic changes or after a metaplasia in the UPPER INNER QUADRANT of the RIGHT breast. Recommend follow-up to document stability. 2. No persistent abnormality in the LEFT breast. RECOMMENDATION: RIGHT diagnostic mammogram and RIGHT breast ultrasound in 6 months. I have discussed the findings and recommendations with the patient. If applicable, a reminder letter will be sent to the patient regarding the next appointment. BI-RADS CATEGORY  3: Probably benign. Electronically Signed   By: Nolon Nations M.D.   On: 05/10/2019 15:17    Assessment & Plan:   Problem List Items Addressed This Visit      Unprioritized   Essential hypertension    Suspect elevated readings are aggravated by use of NSAIDs.  Advised her to dc diclofenac , ibuprofen and return in 2 weeks for bp check.       Hypercalcemia    Screening for occult malignancy, hypervitaminosis D and CKD negative. Urinary calcium excretion pending.   Likely primary hyperparathyroidism.  DEXA scan ordered,  Endocrinology appt is next week.  Continue lasix.  hctz suspended       Hyperthyroidism    Managed with PTU and propranolol. TSH is at goal  Lab Results  Component Value Date   TSH 1.15 09/06/2019         Neuropathy - Primary    Screening for reversible causes done.  Referral to Neurology for  EMG .nerve conduction studies of the upper limbs       Relevant Orders   Ambulatory referral to Neurology      I have discontinued Idania Gaccione's hydrochlorothiazide. I am also having her maintain her FLUTICASONE PROPIONATE NA, colchicine, Multiple Vitamins-Minerals (MULTIVITAMIN ADULT PO), cholecalciferol, propranolol, methocarbamol, diclofenac, furosemide, and losartan.  No orders of the defined types were placed in this encounter.   Medications Discontinued During This Encounter  Medication Reason  . hydrochlorothiazide (HYDRODIURIL) 25 MG tablet Change in therapy    Follow-up: No follow-ups on file.   Crecencio Mc, MD

## 2019-09-05 NOTE — Patient Instructions (Addendum)
Stop using diclofenac and motrin, advil and aleve because they may be elevating your blood pressure  You will have your blood pressure rechecked tomorrow at the endocrinologist;s office.  If it is < 130/80,  We will continue losartan and furosemde.  If it is still high,  We will add a 3rd medication   You can add up to 2000 mg of acetominophen (tylenol) every day safely  In divided doses (500 mg every 6 hours  Or 1000 mg every 12 hours.)   Return for a blood pressure check after you have been off of all NSAIDs (motrin alevel ) for 48 hours   On Monday   You can use the methocarbamol  Which is a muscle relaxer    Neurology referral for  The left arm nerve pain

## 2019-09-06 ENCOUNTER — Other Ambulatory Visit: Payer: Self-pay

## 2019-09-06 ENCOUNTER — Encounter: Payer: Self-pay | Admitting: Internal Medicine

## 2019-09-06 ENCOUNTER — Ambulatory Visit: Payer: BLUE CROSS/BLUE SHIELD | Admitting: Internal Medicine

## 2019-09-06 VITALS — BP 148/78 | HR 58 | Temp 98.4°F | Ht 65.0 in | Wt 182.2 lb

## 2019-09-06 DIAGNOSIS — E0590 Thyrotoxicosis, unspecified without thyrotoxic crisis or storm: Secondary | ICD-10-CM | POA: Diagnosis not present

## 2019-09-06 LAB — CBC WITH DIFFERENTIAL/PLATELET
Basophils Absolute: 0 10*3/uL (ref 0.0–0.1)
Basophils Relative: 0.5 % (ref 0.0–3.0)
Eosinophils Absolute: 0.1 10*3/uL (ref 0.0–0.7)
Eosinophils Relative: 2.5 % (ref 0.0–5.0)
HCT: 39 % (ref 36.0–46.0)
Hemoglobin: 12.8 g/dL (ref 12.0–15.0)
Lymphocytes Relative: 44.6 % (ref 12.0–46.0)
Lymphs Abs: 2.4 10*3/uL (ref 0.7–4.0)
MCHC: 32.9 g/dL (ref 30.0–36.0)
MCV: 76.1 fl — ABNORMAL LOW (ref 78.0–100.0)
Monocytes Absolute: 0.4 10*3/uL (ref 0.1–1.0)
Monocytes Relative: 8 % (ref 3.0–12.0)
Neutro Abs: 2.4 10*3/uL (ref 1.4–7.7)
Neutrophils Relative %: 44.4 % (ref 43.0–77.0)
Platelets: 296 10*3/uL (ref 150.0–400.0)
RBC: 5.12 Mil/uL — ABNORMAL HIGH (ref 3.87–5.11)
RDW: 15.6 % — ABNORMAL HIGH (ref 11.5–15.5)
WBC: 5.5 10*3/uL (ref 4.0–10.5)

## 2019-09-06 LAB — T4, FREE: Free T4: 0.87 ng/dL (ref 0.60–1.60)

## 2019-09-06 LAB — TSH: TSH: 1.15 u[IU]/mL (ref 0.35–4.50)

## 2019-09-06 MED ORDER — PROPYLTHIOURACIL 50 MG PO TABS: 50.0000 mg | ORAL_TABLET | Freq: Every day | ORAL | 3 refills | Status: DC

## 2019-09-06 NOTE — Progress Notes (Signed)
Name: Elizabeth [name]  MRN/ DOB: [id number], 05/14/1961    Age/ Sex: 58 y.o., female    PCP: Crecencio Mc, MD   Reason for Endocrinology Evaluation: Hyperthyroidism/hypercalcemia      Date of Initial Endocrinology Evaluation: 09/06/2019     HPI: Elizabeth [name] is a 58 y.o. female with a past medical history of HTN, Hyperthyroidism and Hypercalcemia . The patient presented for initial endocrinology clinic visit on 09/06/2019 for consultative assistance with her hypercalcemia.   Ms. Flaming indicates that she was first diagnosed with hypercalcemia many years ago. Since that time, she has not  experienced symptoms of constipation polyuria, polydipsia, generalized weakness, diffuse muscle pains, has occasional  memory impairment. She denies use of over the counter calcium but has been taking tums , lithium. She was on HCTZ but this was stopped a week ago and started on Lasix.   Pt is on vitamin D 1000 iu daily   She denies history of kidney stones, kidney disease, liver disease, granulomatous disease. She denies osteoporosis , in 1999 broke her  Right wrist (slip and fall injury). Daily dietary calcium intake: 1-2 servings . She denies  family history of osteoporosis, parathyroid disease. Cousin with thyroid disease.   THYROID HISTORY :  Has been diagnosed with hyperthyroidism in early 200's. She has been on PTU since her diagnosis. It sis prescribed TID but has been taking it once a day for almost a year. She was under the care of an endocrinologist but was lost to follow up.   Has been having hair loss for the past year. Weight fluctuates.     HISTORY:  Past Medical History:  Past Medical History:  Diagnosis Date  . Gout   . Hypertension   . Thyroid disease    Past Surgical History:  Past Surgical History:  Procedure Laterality Date  . ENDOMETRIAL ABLATION  2010    Social History:  reports that she quit smoking about 20 years ago. She has never used smokeless tobacco.  She reports previous alcohol use. She reports that she does not use drugs. Family History: family history includes Heart disease in her mother; Hypertension in her father and mother.   HOME MEDICATIONS: Allergies as of 09/06/2019      Reactions   Codeine    Gabapentin Other (See Comments)   Drowsiness and felt bad overall   Tramadol Other (See Comments)   Made her a zombie      Medication List       Accurate as of September 06, 2019 10:25 AM. If you have any questions, ask your nurse or doctor.        STOP taking these medications   hydrOXYzine 10 MG tablet Commonly known as: ATARAX/VISTARIL Stopped by: Dorita Sciara, MD   triamcinolone cream 0.1 % Commonly known as: KENALOG Stopped by: Dorita Sciara, MD     TAKE these medications   cholecalciferol 25 MCG (1000 UT) tablet Commonly known as: VITAMIN D3 Take 1,000 Units by mouth daily.   colchicine 0.6 MG tablet Take 2 tablets by mouth at onset of gout flare, then in 1 hour take 1 tablet by mouth.   diclofenac 75 MG EC tablet Commonly known as: VOLTAREN Take 1 tablet (75 mg total) by mouth 2 (two) times daily.   FLUTICASONE PROPIONATE NA Place into the nose.   furosemide 20 MG tablet Commonly known as: LASIX Take 1 tablet (20 mg total) by mouth daily.   losartan 100 MG tablet  Commonly known as: COZAAR Take 1 tablet (100 mg total) by mouth daily.   methocarbamol 500 MG tablet Commonly known as: Robaxin Take 1 tablet (500 mg total) by mouth 4 (four) times daily.   MULTIVITAMIN ADULT PO Take by mouth.   propranolol 20 MG tablet Commonly known as: INDERAL TAKE 1 TABLET(20 MG) BY MOUTH THREE TIMES DAILY   propylthiouracil 50 MG tablet Commonly known as: PTU Take 1 tablet (50 mg total) by mouth daily. TAKE 1 TABLET(50 MG) BY MOUTH THREE TIMES DAILY What changed:  how much to take how to take this when to take this Changed by: Dorita Sciara, MD         REVIEW OF SYSTEMS: A  comprehensive ROS was conducted with the patient and is negative except as per HPI and below:  Review of Systems  Constitutional: Negative for chills and fever.  HENT: Negative for congestion and sore throat.   Respiratory: Negative for cough and shortness of breath.   Cardiovascular: Negative for chest pain and palpitations.  Gastrointestinal: Negative for diarrhea and nausea.  Musculoskeletal: Positive for joint pain.  Skin:       Hair loss  Neurological: Positive for tingling. Negative for tremors.  Psychiatric/Behavioral: Negative for depression. The patient is not nervous/anxious.        OBJECTIVE:  VS: BP (!) 148/78 (BP Location: Right Arm, Patient Position: Sitting, Cuff Size: Large)   Pulse (!) 58   Temp 98.4 F (36.9 C)   Ht 5\' 5"  (1.651 m)   Wt 182 lb 3.2 oz (82.6 kg)   SpO2 98%   BMI 30.32 kg/m    Wt Readings from Last 3 Encounters:  09/06/19 182 lb 3.2 oz (82.6 kg)  09/05/19 182 lb 6.4 oz (82.7 kg)  08/22/19 177 lb (80.3 kg)     EXAM: General: Pt appears well and is in NAD  Eyes: External eye exam normal without stare, lid lag or exophthalmos.  EOM intact.    Neck: General: Supple without adenopathy. Thyroid: Thyroid size normal.  ? Right nodule appreciated.   Lungs: Clear with good BS bilat with no rales, rhonchi, or wheezes  Heart: Auscultation: RRR.  Abdomen: Normoactive bowel sounds, soft, nontender, without masses or organomegaly palpable  Extremities:  BL LE: No pretibial edema normal ROM and strength.  Skin: Hair: Texture and amount normal with gender appropriate distribution Skin Inspection: No rashes Skin Palpation: Skin temperature, texture, and thickness normal to palpation  Neuro: Cranial nerves: II - XII grossly intact  Motor: Normal strength throughout DTRs: 2+ and symmetric in UE without delay in relaxation phase  Mental Status: Judgment, insight: Intact Orientation: Oriented to time, place, and person Mood and affect: No depression,  anxiety, or agitation     DATA REVIEWED:   Results for ARRIELLE, DOFFLEMYER (MRN [id number]) as of 09/10/2019 12:30  Ref. Range 09/06/2019 09:28  WBC Latest Ref Range: 4.0 - 10.5 K/uL 5.5  RBC Latest Ref Range: 3.87 - 5.11 Mil/uL 5.12 (H)  Hemoglobin Latest Ref Range: 12.0 - 15.0 g/dL 12.8  HCT Latest Ref Range: 36.0 - 46.0 % 39.0  MCV Latest Ref Range: 78.0 - 100.0 fl 76.1 (L)  MCHC Latest Ref Range: 30.0 - 36.0 g/dL 32.9  RDW Latest Ref Range: 11.5 - 15.5 % 15.6 (H)  Platelets Latest Ref Range: 150.0 - 400.0 K/uL 296.0  Neutrophils Latest Ref Range: 43.0 - 77.0 % 44.4  Lymphocytes Latest Ref Range: 12.0 - 46.0 % 44.6  Monocytes Relative Latest Ref  Range: 3.0 - 12.0 % 8.0  Eosinophil Latest Ref Range: 0.0 - 5.0 % 2.5  Basophil Latest Ref Range: 0.0 - 3.0 % 0.5  NEUT# Latest Ref Range: 1.4 - 7.7 K/uL 2.4  Lymphocyte # Latest Ref Range: 0.7 - 4.0 K/uL 2.4  Monocyte # Latest Ref Range: 0.1 - 1.0 K/uL 0.4  Eosinophils Absolute Latest Ref Range: 0.0 - 0.7 K/uL 0.1  Basophils Absolute Latest Ref Range: 0.0 - 0.1 K/uL 0.0  TSH Latest Ref Range: 0.35 - 4.50 uIU/mL 1.15  T4,Free(Direct) Latest Ref Range: 0.60 - 1.60 ng/dL 0.87    ASSESSMENT/PLAN/RECOMMENDATIONS:   Hyperthyroidism:   - Clinically she is hypothyroid - Will repeat TFT's , last check was normal just a few weeks ago - No local neck symptoms - D/D graves' disease vs toxic thyroid nodule (s) - We discussed with pt the benefits of PTU in the Tx of hyperthyroidism, as well as the possible side effects/complications of anti-thyroid drug Tx (specifically detailing the rare, but serious side effect of agranulocytosis). We discussed the possible side effects of PTU including the chance of rash, the chance of liver irritation/juandice and the chance of sudden onset agranulocytosis.   - We extensively discussed the various treatment options for hyperthyroidism including ablation therapy with radioactive iodine versus antithyroid drug  treatment .  We recommended to the patient that we felt, at this time, that thionamide therapy would be most optimal.  We discussed the various possible benefits versus side effects of the various therapies.   I carefully explained to the patient that one of the consequences of I-131 ablation treatment would likely be permanent hypothyroidism which would require long-term replacement therapy with LT4.   Medications : PTU 50 mg daily    2. Hypercalcemia:   - Pt asymptomatic - We discussed differential of familial hypercalcinuric hypercalcemia ([place]) vs Primary Hyperparathyroidism (pHPT)  - It is important to differentiate between the two, as [place] does not cause any organ damage and does not require further follow up , on the other hand pHPT could cause end organ damage and would require further evaluation.  - Will proceed with 24-hr urine collection , this will be done in 4 weeks, as she was recently taken off HCTZ.  - She was advised to have repeat labs on the day she drops off the urine samples - DXA pending - ordered by PCP    In the meantime she was encouraged to stay hydrated, avoid OTC calcium tablets but to consume 2-3 servings of calcium daily.   F/U in 3 months    Signed electronically by: Mack Guise, MD  [name]  [facility] [address] [place],  [address] Phone: [phone] FAX: [phone]   CC: Crecencio Mc, MD [place] [address] Phone: [phone] Fax: [phone]   Return to Endocrinology clinic as below: Future Appointments  Date Time Provider [place]  12/10/2019  9:50 AM Tonnya Garbett, Melanie Crazier, MD [facility] None

## 2019-09-06 NOTE — Patient Instructions (Addendum)
-   Stay hydrated - Avoid over the counter calcium  - Consume 2-3 servings of dairy daily in your diet   24-Hour Urine Collection in a month    You will be collecting your urine for a 24-hour period of time.  Your timer starts with your first urine of the morning (For example - If you first pee at [place], your timer will start at [place])  [place] your first urine of the morning  Collect your urine every time you pee for the next 24 hours STOP your urine collection 24 hours after you started the collection (For example - You would stop at 9AM the day after you started)     - Will need to do more blood work when you drop off your urine

## 2019-09-07 NOTE — Assessment & Plan Note (Signed)
Managed with PTU and propranolol. TSH is at goal  Lab Results  Component Value Date   TSH 1.15 09/06/2019

## 2019-09-07 NOTE — Assessment & Plan Note (Addendum)
Screening for occult malignancy, hypervitaminosis D and CKD negative. Urinary calcium excretion pending.   Likely primary hyperparathyroidism.  DEXA scan ordered,  Endocrinology appt is next week.  Continue lasix.  hctz suspended

## 2019-09-07 NOTE — Assessment & Plan Note (Signed)
Suspect elevated readings are aggravated by use of NSAIDs.  Advised her to dc diclofenac , ibuprofen and return in 2 weeks for bp check.

## 2019-09-07 NOTE — Assessment & Plan Note (Addendum)
Screening for reversible causes done.  Referral to Neurology for EMG .nerve conduction studies of the upper limbs

## 2019-09-10 ENCOUNTER — Encounter: Payer: Self-pay | Admitting: Internal Medicine

## 2019-09-13 ENCOUNTER — Ambulatory Visit: Payer: BLUE CROSS/BLUE SHIELD | Admitting: Neurology

## 2019-09-13 ENCOUNTER — Other Ambulatory Visit: Payer: Self-pay

## 2019-09-13 ENCOUNTER — Encounter: Payer: Self-pay | Admitting: Neurology

## 2019-09-13 VITALS — BP 152/78 | HR 63 | Temp 96.8°F | Ht 65.0 in | Wt 180.0 lb

## 2019-09-13 DIAGNOSIS — M7918 Myalgia, other site: Secondary | ICD-10-CM | POA: Diagnosis not present

## 2019-09-13 DIAGNOSIS — M542 Cervicalgia: Secondary | ICD-10-CM

## 2019-09-13 DIAGNOSIS — R202 Paresthesia of skin: Secondary | ICD-10-CM

## 2019-09-13 DIAGNOSIS — M5410 Radiculopathy, site unspecified: Secondary | ICD-10-CM

## 2019-09-13 DIAGNOSIS — R29898 Other symptoms and signs involving the musculoskeletal system: Secondary | ICD-10-CM

## 2019-09-13 DIAGNOSIS — G8929 Other chronic pain: Secondary | ICD-10-CM

## 2019-09-13 MED ORDER — DICLOFENAC SODIUM 75 MG PO TBEC: 75.0000 mg | DELAYED_RELEASE_TABLET | Freq: Two times a day (BID) | ORAL | 0 refills | Status: DC

## 2019-09-13 NOTE — Patient Instructions (Signed)
Patient has arm "heaviness", musculoskeletal neck pain (feels better with heat and massage) which may be the cause of symptoms. However given focal neurologic findings on exam(left grip weakness, prox LE weakness and right weakness), under the care of Dr. Derrel Nip for 4 months, failed multiple treatments including muscle relaxers,  recommend MRI of the cervical spine.  PT: Dry Needling for tight, uncomfortable cervical muscles, musculoskeletal pain and tight muscles, cervical myofascil, struggled for year,heat feels better. Also benefit from Therapy for strengthening, evaluation, stretching and any other modality as recommended by OT eval.   EMG/NCS: We will schedule it for end of Feb/beinning of march. If mri c-spine unremarkable, PT and dry needling cervical muscle helps, then she is free to cancel. Or if symptoms worsen, call us and we can move it sooner instead.    Muscle Pain, Adult Muscle pain (myalgia) may be mild or severe. In most cases, the pain lasts only a short time and it goes away without treatment. It is normal to feel some muscle pain after starting a workout program. Muscles that have not been used often will be sore at first. Muscle pain may also be caused by many other things, including:  Overuse or muscle strain, especially if you are not in shape. This is the most common cause of muscle pain.  Injury.  Bruises.  Viruses, such as the flu.  Infectious diseases.  A chronic condition that causes muscle tenderness, fatigue, and headache (fibromyalgia).  A condition, such as lupus, in which the body's disease-fighting system attacks other organs in the body (autoimmune or rheumatologic diseases).  Certain drugs, including ACE inhibitors and statins. To diagnose the cause of your muscle pain, your health care provider will do a physical exam and ask questions about the pain and when it began. If you have not had muscle pain for very long, your health care provider may want to  wait before doing much testing. If your muscle pain has lasted a long time, your health care provider may want to run tests right away. In some cases, this may include tests to rule out certain conditions or illnesses. Treatment for muscle pain depends on the cause. Home care is often enough to relieve muscle pain. Your health care provider may also prescribe anti-inflammatory medicine. Follow these instructions at home: Activity  If overuse is causing your muscle pain: ? Slow down your activities until the pain goes away. ? Do regular, gentle exercises if you are not usually active. ? Warm up before exercising. Stretch before and after exercising. This can help lower the risk of muscle pain.  Do not continue working out if the pain is very bad. Bad pain could mean that you have injured a muscle. Managing pain and discomfort   If directed, apply ice to the sore muscle: ? Put ice in a plastic bag. ? Place a towel between your skin and the bag. ? Leave the ice on for 20 minutes, 2-3 times a day.  You may also alternate between applying ice and applying heat as told by your health care provider. To apply heat, use the heat source that your health care provider recommends, such as a moist heat pack or a heating pad. ? Place a towel between your skin and the heat source. ? Leave the heat on for 20-30 minutes. ? Remove the heat if your skin turns bright red. This is especially important if you are unable to feel pain, heat, or cold. You may have a greater risk of  getting burned. Medicines  Take over-the-counter and prescription medicines only as told by your health care provider.  Do not drive or use heavy machinery while taking prescription pain medicine. Contact a health care provider if:  Your muscle pain gets worse and medicines do not help.  You have muscle pain that lasts longer than 3 days.  You have a rash or fever along with muscle pain.  You have muscle pain after a tick  bite.  You have muscle pain while working out, even though you are in good physical condition.  You have redness, soreness, or swelling along with muscle pain.  You have muscle pain after starting a new medicine or changing the dose of a medicine. Get help right away if:  You have trouble breathing.  You have trouble swallowing.  You have muscle pain along with a stiff neck, fever, and vomiting.  You have severe muscle weakness or cannot move part of your body. This information is not intended to replace advice given to you by your health care provider. Make sure you discuss any questions you have with your health care provider. Document Revised: 08/05/2017 Document Reviewed: 01/13/2016 Elsevier Patient Education  [place].

## 2019-09-13 NOTE — Progress Notes (Signed)
[facility]    Provider:  Dr Jaynee Eagles Requesting Provider: Crecencio Mc, MD Primary Care Provider:  Crecencio Mc, MD  CC:  Neck and arm symptoms, pain, tingling, heaviness  HPI:  Elizabeth [name] is a 59 y.o. female here as requested by Crecencio Mc, MD for neuropathic arm pain. Started in September of 2020, she felt a heavy weight in the arms, she started feeling it in the neck, not weakness but heaviness. It lasted for less than a day, an hour or two, no inciting event, she was sitting in the hospital with her mother, both arms symmetric, nothing in the face. It went away. It came back it was both arms, started again a few weeks ago, with tingling in the fingers and numbness and spasms in her neck, the tingling stayed for a few days. This was the third episode, left was worse, left hand was worse, it improves but today she still feel something is there in the hand and in the neck.  It goes "up and down" the neck, spasms, heaviness. Yesterday she was feeling something in her head like something was touching it bust wasn't. She doesn't feel weak now but she feels like her neck is tight and again left hand feels something. Nothing in the legs associated. No falls, no bladder changes, no bowel changes, her vision is blurry but her glasses broke and she is not wearing glasses. She has been managing trying to stretch, move arms around, heat exercising, over the counter medications, she has been under the care of [facility] during this tie, she was prescribed muscle relaxers and she has een trying that. No other focal neurologic deficits, associated symptoms, inciting events or modifiable factors.  Reviewed notes, labs and imaging from outside physicians, which showed:  I reviewed Dr. Wende Crease notes.  Patient evaluated for neuropathy.  Patient has bilateral upper extremity neuropathy, back pain.  She feels bilateral shoulder arm neuropathy, right worse than left, tingling burning,  followed by aching, this is chronic.  She also has thoracic back pain which is chronic, since the fall on the job 1999 that also fractured the right wrist.  I reviewed labs CBC showed normal hemoglobin and hematocrit however slightly decreased MCV and increased RBC and increased RDW, TSH normal, BMP showed hypercalcemia otherwise normal.  Hemoglobin A1c 5.7 also in December 2020.  7 months prior it was 5.8.   Review of Systems: Patient complains of symptoms per HPI as well as the following symptoms: right leg pain. Pertinent negatives and positives per HPI. All others negative.   Social History   Socioeconomic History  . Marital status: Married    Spouse name: Not on file  . Number of children: Not on file  . Years of education: Not on file  . Highest education level: Not on file  Occupational History  . Not on file  Tobacco Use  . Smoking status: Former Smoker    Quit date: 01/12/1999    Years since quitting: 20.6  . Smokeless tobacco: Never Used  Substance and Sexual Activity  . Alcohol use: Not Currently    Comment: ocassionally (last over the holidays 2020)  . Drug use: Never  . Sexual activity: Not on file  Other Topics Concern  . Not on file  Social History Narrative   Lives with husband   Right handed   Social Determinants of Health   Financial Resource Strain:   . Difficulty of Paying Living Expenses: Not on file  Food  Insecurity:   . Worried About Charity fundraiser in the Last Year: Not on file  . Ran Out of Food in the Last Year: Not on file  Transportation Needs:   . Lack of Transportation (Medical): Not on file  . Lack of Transportation (Non-Medical): Not on file  Physical Activity:   . Days of Exercise per Week: Not on file  . Minutes of Exercise per Session: Not on file  Stress:   . Feeling of Stress : Not on file  Social Connections:   . Frequency of Communication with Friends and Family: Not on file  . Frequency of Social Gatherings with Friends and  Family: Not on file  . Attends Religious Services: Not on file  . Active Member of Clubs or Organizations: Not on file  . Attends Archivist Meetings: Not on file  . Marital Status: Not on file  Intimate Partner Violence:   . Fear of Current or Ex-Partner: Not on file  . Emotionally Abused: Not on file  . Physically Abused: Not on file  . Sexually Abused: Not on file    Family History  Problem Relation Age of Onset  . Hypertension Mother   . Heart disease Mother   . Heart failure Mother   . Hypertension Father     Past Medical History:  Diagnosis Date  . Gout   . Hypertension   . Thyroid disease     Patient Active Problem List   Diagnosis Date Noted  . Hypercalcemia 08/22/2019  . Chronic left shoulder pain 08/22/2019  . Hyperlipidemia 03/19/2019  . 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 Essential hypertension 01/12/2019  . Hyperthyroidism 01/12/2019  . Neuropathy 01/12/2019    Past Surgical History:  Procedure Laterality Date  . ENDOMETRIAL ABLATION  2010    Current Outpatient Medications  Medication Sig Dispense Refill  . cholecalciferol (VITAMIN D3) 25 MCG (1000 UT) tablet Take 1,000 Units by mouth daily.    . colchicine 0.6 MG tablet Take 2 tablets by mouth at onset of gout flare, then in 1 hour take 1 tablet by mouth. 3 tablet 0  . FLUTICASONE PROPIONATE NA Place into the nose.    . furosemide (LASIX) 20 MG tablet Take 1 tablet (20 mg total) by mouth daily. 90 tablet 1  . losartan (COZAAR) 100 MG tablet Take 1 tablet (100 mg total) by mouth daily. 90 tablet 3  . methocarbamol (ROBAXIN) 500 MG tablet Take 1 tablet (500 mg total) by mouth 4 (four) times daily. 60 tablet 1  . Multiple Vitamins-Minerals (MULTIVITAMIN ADULT PO) Take by mouth.    . propranolol (INDERAL) 20 MG tablet TAKE 1 TABLET(20 MG) BY MOUTH THREE TIMES DAILY 270 tablet 0  . propylthiouracil (PTU) 50 MG tablet Take 1 tablet (50 mg total) by mouth daily. TAKE 1 TABLET(50 MG) BY MOUTH  THREE TIMES DAILY 30 tablet 3  . diclofenac (VOLTAREN) 75 MG EC tablet Take 1 tablet (75 mg total) by mouth 2 (two) times daily. 90 tablet 0   No current facility-administered medications for this visit.    Allergies as of 09/13/2019 - Review Complete 09/13/2019  Allergen Reaction Noted  . Codeine  01/12/2019  . Gabapentin Other (See Comments) 05/17/2019  . Tramadol Other (See Comments) 09/05/2019    Vitals: BP (!) 152/78 (BP Location: Right Arm, Patient Position: Sitting)   Pulse 63   Temp (!) 96.8 F (36 C) Comment: taken at front  Ht 5\' 5"  (1.651 m)  Wt 180 lb (81.6 kg)   BMI 29.95 kg/m  Last Weight:  Wt Readings from Last 1 Encounters:  09/13/19 180 lb (81.6 kg)   Last Height:   Ht Readings from Last 1 Encounters:  09/13/19 5\' 5"  (1.651 m)     Physical exam: Exam: Gen: NAD, conversant, well nourised, overweight, well groomed                     CV: RRR, no MRG. No Carotid Bruits. No peripheral edema, warm, nontender Eyes: Conjunctivae clear without exudates or hemorrhage  Neuro: Detailed Neurologic Exam  Speech:    Speech is normal; fluent and spontaneous with normal comprehension.  Cognition:    The patient is oriented to person, place, and time;     recent and remote memory intact;     language fluent;     normal attention, concentration,     fund of knowledge Cranial Nerves:    The pupils are equal, round, and reactive to light. The fundi are normal and spontaneous venous pulsations are present. Visual fields are full to finger confrontation. Extraocular movements are intact. Trigeminal sensation is intact and the muscles of mastication are normal. The face is symmetric. The palate elevates in the midline. Hearing intact. Voice is normal. Shoulder shrug is normal. The tongue has normal motion without fasciculations.   Coordination:    Normal finger to nose and heel to shin. Normal rapid alternating movements.   Gait:    Heel-toe and tandem gait are  normal.   Motor Observation:    No asymmetry, no atrophy, and no involuntary movements noted. Tone:    Normal muscle tone.    Posture:    Posture is normal. normal erect    Strength: left grip is weaker than the right. Mild LE prox weakness symmetric and within normal range for age. Right leg flexion 4/5. Strength is V/V in the upper and lower limbs.      Sensation: intact to LT     Reflex Exam:  DTR's: right AJ slightly hypo to the left. Otherwise deep tendon reflexes in the upper and lower extremities are normal bilaterally.   Toes:    The toes are downgoing bilaterally.   Clonus:    Clonus is absent.    Assessment/Plan:  Patient has arm "heaviness", musculoskeletal neck pain (feels better with heat and massage) which may be the cause of symptoms. However given focal neurologic findings on exam(left grip weakness, prox LE weakness and right weakness), under the care of Dr. Derrel Nip for 4 months, failed multiple treatments including muscle relaxers,  recommend MRI of the cervical spine.  PT: Dry Needling for tight, uncomfortable cervical muscles, musculoskeletal pain and tight muscles, cervical myofascil, struggled for year,heat feels better. Also benefit from Therapy for strengthening, evaluation, stretching and any other modality as recommended by OT eval.   EMG/NCS: We will schedule it for end of Feb/beinning of march. If mri c-spine unremarkable, PT and dry needling cervical muscle helps, then she is free to cancel. Or if symptoms worsen, call us and we can move it sooner instead.   Will check a few labs as well  Continue heat and other supportive measures  Orders Placed This Encounter  Procedures  . MR CERVICAL SPINE WO CONTRAST  . CK  . B12 and Folate Panel  . Methylmalonic Acid, Serum  . Ambulatory referral to Physical Therapy  . NCV with EMG(electromyography)    Cc: Crecencio Mc, MD  Sarina Ill,  MD  [facility] [address] [place], [place] [address]  Phone [phone] Fax [phone]

## 2019-09-16 ENCOUNTER — Encounter: Payer: Self-pay | Admitting: Neurology

## 2019-09-16 DIAGNOSIS — M7918 Myalgia, other site: Secondary | ICD-10-CM | POA: Insufficient documentation

## 2019-09-18 LAB — METHYLMALONIC ACID, SERUM: Methylmalonic Acid: 199 nmol/L (ref 0–378)

## 2019-09-18 LAB — B12 AND FOLATE PANEL
Folate: 13.8 ng/mL (ref >3.0)
Vitamin B-12: 729 pg/mL (ref 232–1245)

## 2019-09-18 LAB — CK: Total CK: 163 U/L (ref 32–182)

## 2019-09-21 ENCOUNTER — Encounter: Payer: Self-pay | Admitting: Internal Medicine

## 2019-09-25 ENCOUNTER — Ambulatory Visit
Admission: RE | Admit: 2019-09-25 | Discharge: 2019-09-25 | Disposition: A | Discharge status: Home / Self Care | Payer: BLUE CROSS/BLUE SHIELD | Source: Ambulatory Visit | Attending: Internal Medicine | Admitting: Internal Medicine

## 2019-09-25 DIAGNOSIS — E210 Primary hyperparathyroidism: Secondary | ICD-10-CM | POA: Insufficient documentation

## 2019-09-25 DIAGNOSIS — Z13820 Encounter for screening for osteoporosis: Secondary | ICD-10-CM | POA: Diagnosis not present

## 2019-09-27 ENCOUNTER — Ambulatory Visit: Payer: BLUE CROSS/BLUE SHIELD | Attending: Neurology | Admitting: Physical Therapy

## 2019-09-27 DIAGNOSIS — M542 Cervicalgia: Secondary | ICD-10-CM | POA: Insufficient documentation

## 2019-09-27 DIAGNOSIS — M6281 Muscle weakness (generalized): Secondary | ICD-10-CM | POA: Insufficient documentation

## 2019-09-28 ENCOUNTER — Other Ambulatory Visit: Payer: Self-pay

## 2019-09-28 ENCOUNTER — Encounter: Payer: Self-pay | Admitting: Physical Therapy

## 2019-09-28 ENCOUNTER — Ambulatory Visit: Payer: BLUE CROSS/BLUE SHIELD | Admitting: Physical Therapy

## 2019-09-28 DIAGNOSIS — M542 Cervicalgia: Secondary | ICD-10-CM | POA: Diagnosis not present

## 2019-09-28 DIAGNOSIS — M6281 Muscle weakness (generalized): Secondary | ICD-10-CM | POA: Diagnosis not present

## 2019-09-28 NOTE — Patient Instructions (Signed)
Access Code: [facility]  URL: [email or web address]  Date: 09/28/2019  Prepared by: Hilda Blades   Exercises Gentle Upper Trap Stretch - 2 reps - 20 seconds hold - 2x daily - 7x weekly Gentle Levator Scapulae Stretch - 2 reps - 20 seconds hold - 2x daily - 7x weekly Supine Chin Tuck - 10 reps - 3 seconds hold - 2x daily - 7x weekly Standing Bilateral Low Shoulder Row with Anchored Resistance - 10 reps - 3 seconds hold - 2x daily - 7x weekly

## 2019-09-28 NOTE — Therapy (Signed)
[place], [place], [address] Phone: [phone]   Fax:  [phone]  Physical Therapy Evaluation  Patient Details  Name: Elizabeth [name] MRN: [id number] Date of Birth: 12-11-1960 Referring Provider (PT): Melvenia Beam, MD   Encounter Date: 09/28/2019  PT End of Session - 09/28/19 1010    Visit Number  1    Number of Visits  8    Date for PT Re-Evaluation  11/23/19    Authorization Type  [employer]    PT Start Time  1000    PT Stop Time  1045    PT Time Calculation (min)  45 min    Activity Tolerance  Patient tolerated treatment well    Behavior During Therapy  [facility] for tasks assessed/performed       Past Medical History:  Diagnosis Date  . Gout   . Hypertension   . Thyroid disease     Past Surgical History:  Procedure Laterality Date  . ENDOMETRIAL ABLATION  2010    There were no vitals filed for this visit.   Subjective Assessment - 09/28/19 0959    Subjective  Patient reports that she gets episodes when both of her hands start tingling and her arms feel heavy. These started September of 2020. She also has been having neck pain or spams for the past few years. After the episodes of weakness she will have more pain and spasm in the neck. The episodes come on without warning and without any apparent mechanism. When episodes occur she has trouble lifting her arms and performing household tasks.    Limitations  House hold activities;Lifting    How long can you sit comfortably?  No limitation    How long can you stand comfortably?  No limitation    How long can you walk comfortably?  No limitation    Patient Stated Goals  Get rid of neck pain and stop episodes of weakness    Currently in Pain?  Yes    Pain Score  6     Pain Location  Neck    Pain Orientation  Right    Pain Descriptors / Indicators  --   patient unable to describe, "it is just a pain"   Pain Type  Chronic pain    Pain Radiating  Towards  Scapular region    Pain Onset  More than a month ago    Pain Frequency  Constant    Aggravating Factors   Turning head to right    Pain Relieving Factors  Nothing    Effect of Pain on Daily Activities  Patient feels limited in heavy household activities         [facility] PT Assessment - 09/28/19 0001      Assessment   Medical Diagnosis  Cervical myofascial pain syndrome    Referring Provider (PT)  Melvenia Beam, MD    Onset Date/Surgical Date  --   05/2019   Hand Dominance  Right    Next MD Visit  11/01/2019    Prior Therapy  Yes - many years ago      Precautions   Precautions  None      Restrictions   Weight Bearing Restrictions  No      Balance Screen   Has the patient fallen in the past 6 months  No    Has the patient had a decrease in activity level because of a fear of falling?   No  Is the patient reluctant to leave their home because of a fear of falling?   No      Home Film/video editor residence      Prior Function   Level of Independence  Independent with basic ADLs      Cognition   Overall Cognitive Status  Within Functional Limits for tasks assessed      Observation/Other Assessments   Observations  Patient appears in no apparent distress    Focus on Therapeutic Outcomes (FOTO)   62% limitation      Sensation   Light Touch  Appears Intact      Posture/Postural Control   Posture Comments  Patient exhibits rounded shoulder and forward head posture with increased thoracic kyphosis      ROM / Strength   AROM / PROM / Strength  AROM;Strength      AROM   Overall AROM Comments  Shoulder elevation limited bilaterally and patient reports shoulder pain    AROM Assessment Site  Cervical    Cervical Flexion  30   right upper trap pain   Cervical Extension  30    Cervical - Right Side Bend  40    Cervical - Left Side Bend  35   right upper trap pain   Cervical - Right Rotation  55   right upper trap pain   Cervical - Left  Rotation  65      Strength   Overall Strength Comments  Periscapular strength grossly 4-/5 bilaterally    Strength Assessment Site  Shoulder    Right/Left Shoulder  Right;Left    Right Shoulder Flexion  4/5    Right Shoulder Extension  4/5    Right Shoulder ABduction  4/5    Right Shoulder External Rotation  4/5    Left Shoulder Flexion  4/5    Left Shoulder Extension  4/5    Left Shoulder ABduction  4/5    Left Shoulder External Rotation  4/5      Flexibility   Soft Tissue Assessment /Muscle Length  yes   right upper trap tightness     Palpation   Palpation comment  TTP to bilat upper trap, levator, cervical paraspinals, suboccipital regions      Special Tests   Other special tests  DNF endurance test: <5 seconds                Objective measurements completed on examination: See above findings.      [facility] PT Treatment/Exercise - 09/28/19 0001      Exercises   Exercises  Neck      Neck Exercises: Theraband   Rows  10 reps    Rows Limitations  yellow, 3 second hold - mod verbal and tactile cueing for form to avoid shrug      Neck Exercises: Supine   Neck Retraction  10 reps;3 secs    Neck Retraction Limitations  verbal cueing for form      Neck Exercises: Stretches   Upper Trapezius Stretch  2 reps;20 seconds    Levator Stretch  2 reps;20 seconds             PT Education - 09/28/19 1010    Education Details  Exam findings, POC, HEP, possible use of dry needling for treatment of tight neck muscles    Person(s) Educated  Patient    Methods  Explanation;Demonstration;Tactile cues;Verbal cues;Handout    Comprehension  Verbalized understanding;Returned demonstration;Verbal cues required;Tactile  cues required;Need further instruction       PT Short Term Goals - 09/28/19 1011      PT SHORT TERM GOAL #1   Title  Patient will be I with initial HEP to progress in PT    Time  4    Period  Weeks    Status  New    Target Date  10/26/19       PT SHORT TERM GOAL #2   Title  Patient will report improved resting neck pain of </= 2-3 pain level to improve activity tolerance    Time  4    Period  Weeks    Status  New    Target Date  10/26/19      PT SHORT TERM GOAL #3   Title  Patient will be able to perform light household tasks such as cleaning with no difficulty    Time  4    Period  Weeks    Status  New    Target Date  10/26/19      PT SHORT TERM GOAL #4   Title  Patient will exhibit improved ROM of right rotation to 65 deg to allow for improved driving ability without pain or limitation    Time  4    Period  Weeks    Status  New    Target Date  11/09/19        PT Long Term Goals - 09/28/19 1012      PT LONG TERM GOAL #1   Title  Patient will be independent in final HEP to maintain progress made in PT    Time  8    Period  Weeks    Status  New    Target Date  11/23/19      PT LONG TERM GOAL #2   Title  Patient will exhibit improved cervical motion to [facility] to allow for ability to perform all grooming and dressing tasks without pain or limitation    Time  8    Period  Weeks    Status  New    Target Date  11/23/19      PT LONG TERM GOAL #3   Title  Patient will exhibit improved periscapular strength of >/= 4/5 MMT and DNF endurance >/= 20 sec to allow for improved postural control and lifting ability    Time  12    Period  Weeks    Status  New    Target Date  12/21/19      PT LONG TERM GOAL #4   Title  Patient will be able to perform heavy household chores such as vaccuuming or lifting objects into overhead cabinets  with no difficulty    Time  12    Period  Weeks    Status  New    Target Date  12/21/19      PT LONG TERM GOAL #5   Title  Patient will report improved function of </= 46% limitation on FOTO    Time  12    Period  Weeks    Status  New    Target Date  12/21/19             Plan - 09/28/19 1015    Clinical Impression Statement  Patient presents to PT with report of chronic neck  pain and tightness with new onset of BUE heaviness that occurs in episodes with no apparent mechanism. She exhibits limited cervical motion with increased muscular tension/tightness, periscapular  and rotator cuff strength deficit, postural deficits, DNF endurance deficit, and pain that limits heavy household activities. Its not clear exactly the mechanism of her BUE heaviness as she did not have an episode this visit or any testing elicit any peripheral symptoms. She was provided with exercises to initiate stretching and strengthening and she would befefit from continued skilled PT to improve pain and reduce feeling of heaviness so she can perform all daily tasks with no limitation.    Personal Factors and Comorbidities  Past/Current Experience;Time since onset of injury/illness/exacerbation    Examination-Activity Limitations  Carry;Lift;Sleep    Examination-Participation Restrictions  Cleaning;Meal Prep;Community Activity;Laundry;Yard Work    [employer]chant [employer] officer  Evolving/Moderate complexity    Clinical Decision Making  Moderate    Rehab Potential  Good    PT Frequency  1x / week    PT Duration  12 weeks    PT Treatment/Interventions  ADLs/Self Care Home Management;Cryotherapy;Electrical Stimulation;Moist Heat;Traction;Neuromuscular re-education;Therapeutic exercise;Therapeutic activities;Patient/family education;Manual techniques;Dry needling;Passive range of motion;Taping;Spinal Manipulations;Joint Manipulations    PT Next Visit Plan  Assess HEP and progress PRN, manual and dry needling PRN, progress postural strength and DNF endurance    PT Home Exercise Plan  Upper trap and levator stretching, supine chin tuck, row with yellow    Consulted and Agree with Plan of Care  Patient       Patient will benefit from skilled therapeutic intervention in order to improve the following deficits and impairments:  Decreased range of motion, Pain, Increased muscle spasms, Decreased activity  tolerance, Postural dysfunction, Decreased strength, Impaired flexibility  Visit Diagnosis: Cervicalgia  Muscle weakness (generalized)     Problem List Patient Active Problem List   Diagnosis Date Noted  . Cervical myofascial pain syndrome 09/16/2019  . Hypercalcemia 08/22/2019  . Chronic left shoulder pain 08/22/2019  . Hyperlipidemia 03/19/2019  . 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 Essential hypertension 01/12/2019  . Hyperthyroidism 01/12/2019  . Neuropathy 01/12/2019    Hilda Blades, PT, DPT, LAT, ATC 09/28/19  12:36 PM Phone: [phone] Fax: [place] [facility] [address] [place], [place], [address] Phone: [phone]   Fax:  [phone]  Name: Elizabeth [name] MRN: [id number] Date of Birth: [birth year]

## 2019-10-08 ENCOUNTER — Telehealth: Payer: Self-pay

## 2019-10-08 NOTE — Telephone Encounter (Signed)
[employer] needed clinical notes faxed to them in order to proceed with PA request. I have faxed notes to [phone] and received fax confirmation.

## 2019-10-11 ENCOUNTER — Ambulatory Visit: Payer: BLUE CROSS/BLUE SHIELD | Attending: Neurology | Admitting: Physical Therapy

## 2019-10-11 ENCOUNTER — Other Ambulatory Visit: Payer: Self-pay

## 2019-10-11 ENCOUNTER — Encounter: Payer: Self-pay | Admitting: Physical Therapy

## 2019-10-11 DIAGNOSIS — M542 Cervicalgia: Secondary | ICD-10-CM | POA: Diagnosis not present

## 2019-10-11 DIAGNOSIS — M6281 Muscle weakness (generalized): Secondary | ICD-10-CM | POA: Diagnosis not present

## 2019-10-11 NOTE — Telephone Encounter (Signed)
Let patient know the insurance did not approve the MRI and requires 6 weeks of PT. first I suggest patient continue PT, we get the emg/ncs completed and if needed at that time we can re-submit the MRI for cervical spine and they will likely approve it at that time. thanks

## 2019-10-11 NOTE — Telephone Encounter (Signed)
They received the fax of the clinical notes and it still didn't get approved needing a peer to peer.

## 2019-10-11 NOTE — Telephone Encounter (Signed)
[employer] did not approve the MRI Cervical.   "Your records show that you have neck pain. The reason this request cannot be approved is because: Guidelines may support imaging in the evaluation of suspected or known spinal disease with one of the following. Failure to improve after a recent (within three months) six week trial of physician-guided clinical care with clinical re-evaluation. Signs or symptoms of serious underlying spinal and/or non-spinal disease suggested by the presence of red flags."  There is an option to do a peer to peer. The case number is [id number]. The phone number is [phone] option 1. It does need to be scheduled the deadline for the peer to peer to be done by is Wednesday 10/17/19.

## 2019-10-11 NOTE — Therapy (Signed)
[place], [place], [address] Phone: [phone]   Fax:  [phone]  Physical Therapy Treatment  Patient Details  Name: Elizabeth [name] MRN: [id number] Date of Birth: 09-30-1960 Referring Provider (PT): Melvenia Beam, MD   Encounter Date: 10/11/2019  PT End of Session - 10/11/19 0943    Visit Number  2    Number of Visits  8    Date for PT Re-Evaluation  11/23/19    Authorization Type  BCBS    PT Start Time  [phone]   pt. arrived late   PT Stop Time  1015   8 min spent dry needling not included in direct timed minutes   PT Time Calculation (min)  33 min    Activity Tolerance  Patient tolerated treatment well    Behavior During Therapy  [facility] for tasks assessed/performed       Past Medical History:  Diagnosis Date  . Gout   . Hypertension   . Thyroid disease     Past Surgical History:  Procedure Laterality Date  . ENDOMETRIAL ABLATION  2010    There were no vitals filed for this visit.  Subjective Assessment - 10/11/19 0944    Subjective  Pt. reports pain about 5/10 particularly in right upper trapezius region. She reports continued parasthesias in arms with "heaviness".    Patient Stated Goals  Get rid of neck pain and stop episodes of weakness    Currently in Pain?  Yes    Pain Score  5     Pain Location  Neck    Pain Orientation  Right    Pain Type  Chronic pain    Pain Onset  More than a month ago    Pain Frequency  Constant    Aggravating Factors   turning head to right    Pain Relieving Factors  no eases    Effect of Pain on Daily Activities  limited ability heavy household activities                       [facility] Adult PT Treatment/Exercise - 10/11/19 0001      Manual Therapy   Manual Therapy  Joint mobilization;Myofascial release;Manual Traction    Joint Mobilization  Thoracic PAs grade I-III    Myofascial Release  suboccipital release    Manual Traction  gentle  cervical manual traction      Neck Exercises: Stretches   Upper Trapezius Stretch  Right;Left;3 reps;30 seconds    Levator Stretch  Right;Left;3 reps;30 seconds    Chest Stretch  3 reps;20 seconds   supine manual stretch      Trigger Point Dry Needling - 10/11/19 0001    Consent Given?  Yes    Education Handout Provided  Yes    Muscles Treated Head and Neck  Upper trapezius;Semispinalis capitus;Cervical multifidi    Dry Needling Comments  needling performed in prone with 32 gauge 30 and 50 mm needles    Upper Trapezius Response  Twitch reponse elicited           PT Education - 10/11/19 1020    Education Details  dry needling, POC    Person(s) Educated  Patient    Methods  Explanation;Handout    Comprehension  Verbalized understanding       PT Short Term Goals - 09/28/19 1011      PT SHORT TERM GOAL #1   Title  Patient will be I with  initial HEP to progress in PT    Time  4    Period  Weeks    Status  New    Target Date  10/26/19      PT SHORT TERM GOAL #2   Title  Patient will report improved resting neck pain of </= 2-3 pain level to improve activity tolerance    Time  4    Period  Weeks    Status  New    Target Date  10/26/19      PT SHORT TERM GOAL #3   Title  Patient will be able to perform light household tasks such as cleaning with no difficulty    Time  4    Period  Weeks    Status  New    Target Date  10/26/19      PT SHORT TERM GOAL #4   Title  Patient will exhibit improved ROM of right rotation to 65 deg to allow for improved driving ability without pain or limitation    Time  4    Period  Weeks    Status  New    Target Date  11/09/19        PT Long Term Goals - 09/28/19 1012      PT LONG TERM GOAL #1   Title  Patient will be independent in final HEP to maintain progress made in PT    Time  8    Period  Weeks    Status  New    Target Date  11/23/19      PT LONG TERM GOAL #2   Title  Patient will exhibit improved cervical motion to  [facility] to allow for ability to perform all grooming and dressing tasks without pain or limitation    Time  8    Period  Weeks    Status  New    Target Date  11/23/19      PT LONG TERM GOAL #3   Title  Patient will exhibit improved periscapular strength of >/= 4/5 MMT and DNF endurance >/= 20 sec to allow for improved postural control and lifting ability    Time  12    Period  Weeks    Status  New    Target Date  12/21/19      PT LONG TERM GOAL #4   Title  Patient will be able to perform heavy household chores such as vaccuuming or lifting objects into overhead cabinets  with no difficulty    Time  12    Period  Weeks    Status  New    Target Date  12/21/19      PT LONG TERM GOAL #5   Title  Patient will report improved function of </= 46% limitation on FOTO    Time  12    Period  Weeks    Status  New    Target Date  12/21/19            Plan - 10/11/19 1020    Clinical Impression Statement  Session abbreviated today due to pt. running a few minutes late-trial dry needling as noted per flowsheet was well-tolerated so will await further response to this for symptoms. Otherwise focus gentle manual and stretches also with good tolerance and decreased subjective report of tightness by end of session.    Personal Factors and Comorbidities  Past/Current Experience;Time since onset of injury/illness/exacerbation    Examination-Activity Limitations  Carry;Lift;Sleep    Examination-Participation Restrictions  Cleaning;Meal Prep;Community Activity;Laundry;Yard Work    [employer]  Evolving/Moderate complexity    Clinical Decision Making  Moderate    Rehab Potential  Good    PT Frequency  1x / week    PT Duration  12 weeks    PT Treatment/Interventions  ADLs/Self Care Home Management;Cryotherapy;Electrical Stimulation;Moist Heat;Traction;Neuromuscular re-education;Therapeutic exercise;Therapeutic activities;Patient/family education;Manual techniques;Dry  needling;Passive range of motion;Taping;Spinal Manipulations;Joint Manipulations    PT Next Visit Plan  continue manual, postural strengthening, DNF endurance, stretches, further dry needling as needed    PT Home Exercise Plan  Upper trap and levator stretching, supine chin tuck, row with yellow    Consulted and Agree with Plan of Care  Patient       Patient will benefit from skilled therapeutic intervention in order to improve the following deficits and impairments:  Decreased range of motion, Pain, Increased muscle spasms, Decreased activity tolerance, Postural dysfunction, Decreased strength, Impaired flexibility  Visit Diagnosis: Cervicalgia  Muscle weakness (generalized)     Problem List Patient Active Problem List   Diagnosis Date Noted  . Cervical myofascial pain syndrome 09/16/2019  . Hypercalcemia 08/22/2019  . Chronic left shoulder pain 08/22/2019  . Hyperlipidemia 03/19/2019  . 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 Essential hypertension 01/12/2019  . Hyperthyroidism 01/12/2019  . Neuropathy 01/12/2019   Beaulah Dinning, PT, DPT 10/11/19 11:05 AM  [facility] [address] [place], [place], [address] Phone: [phone]   Fax:  [phone]  Name: Elizabeth [name] MRN: [id number] Date of Birth: Jun 07, 1961

## 2019-10-11 NOTE — Patient Instructions (Signed)

## 2019-10-11 NOTE — Telephone Encounter (Signed)
Noted, I informed the patient the her insurance [employer] did not approve the MRI and for her to continue PT for 6 weeks and to complete the NCV/EMG and once that is all done if needed the MRI will be re-submitted. Patient is aware and understands.

## 2019-10-11 NOTE — Telephone Encounter (Signed)
So are they reviewing the notes still that [facility] sent or did they do that and still need a peer-to-peer?

## 2019-10-16 DIAGNOSIS — Z780 Asymptomatic menopausal state: Secondary | ICD-10-CM | POA: Diagnosis not present

## 2019-10-16 DIAGNOSIS — E213 Hyperparathyroidism, unspecified: Secondary | ICD-10-CM | POA: Diagnosis not present

## 2019-10-16 DIAGNOSIS — E0590 Thyrotoxicosis, unspecified without thyrotoxic crisis or storm: Secondary | ICD-10-CM | POA: Diagnosis not present

## 2019-10-18 ENCOUNTER — Ambulatory Visit: Payer: BLUE CROSS/BLUE SHIELD | Admitting: Physical Therapy

## 2019-10-18 ENCOUNTER — Other Ambulatory Visit: Payer: Self-pay

## 2019-10-18 ENCOUNTER — Encounter: Payer: Self-pay | Admitting: Physical Therapy

## 2019-10-18 DIAGNOSIS — M542 Cervicalgia: Secondary | ICD-10-CM

## 2019-10-18 DIAGNOSIS — M6281 Muscle weakness (generalized): Secondary | ICD-10-CM

## 2019-10-18 NOTE — Therapy (Signed)
[place], [place], [address] Phone: [phone]   Fax:  [phone]  Physical Therapy Treatment  Patient Details  Name: Elizabeth [name] MRN: [id number] Date of Birth: [birth year] Referring Provider (PT): Melvenia Beam, MD   Encounter Date: 10/18/2019  PT End of Session - 10/18/19 1056    Visit Number  3    Number of Visits  8    Date for PT Re-Evaluation  11/23/19    Authorization Type  BCBS    PT Start Time  1050    PT Stop Time  1130    PT Time Calculation (min)  40 min    Activity Tolerance  Patient tolerated treatment well    Behavior During Therapy  [facility] for tasks assessed/performed       Past Medical History:  Diagnosis Date  . Gout   . Hypertension   . Thyroid disease     Past Surgical History:  Procedure Laterality Date  . ENDOMETRIAL ABLATION  2010    There were no vitals filed for this visit.  Subjective Assessment - 10/18/19 1052    Subjective  Patient reports she has been getting little spasms but they have not been hurting like they were. She is still having some soreness and crampy feeling on the right upper trap region.    Patient Stated Goals  Get rid of neck pain and stop episodes of weakness    Currently in Pain?  Yes    Pain Score  5     Pain Location  Neck    Pain Orientation  Right    Pain Descriptors / Indicators  Sore;Cramping    Pain Type  Chronic pain    Pain Onset  More than a month ago    Pain Frequency  Intermittent                       [facility] Adult PT Treatment/Exercise - 10/18/19 0001      Exercises   Exercises  Neck      Neck Exercises: Machines for Strengthening   UBE (Upper Arm Bike)  L1 x 4 min (2 fwd/bwd)      Neck Exercises: Theraband   Shoulder Extension  10 reps;Red   2 sets   Rows  10 reps;Red   2 sets   Horizontal ABduction  10 reps   2 sets   Horizontal ABduction Limitations  supine with yellow      Neck Exercises:  Supine   Neck Retraction  10 reps;5 secs    Neck Retraction Limitations  VC for technique    Other Supine Exercise  --      Manual Therapy   Manual Therapy  Soft tissue mobilization;Passive ROM;Manual Traction;Myofascial release    Soft tissue mobilization  Bilat upper trap    Myofascial Release  Suboccipital release    Passive ROM  Upper trap and levator stretching    Manual Traction  Gentle cervical manual traction in combination with suboccipital release      Neck Exercises: Stretches   Chest Stretch  3 reps;20 seconds    Chest Stretch Limitations  doorway with elbow bent at 90 deg abd             PT Education - 10/18/19 1054    Education Details  HEP    Person(s) Educated  Patient    Methods  Explanation;Demonstration;Verbal cues    Comprehension  Verbalized understanding;Returned demonstration;Verbal cues required;Need  further instruction       PT Short Term Goals - 09/28/19 1011      PT SHORT TERM GOAL #1   Title  Patient will be I with initial HEP to progress in PT    Time  4    Period  Weeks    Status  New    Target Date  10/26/19      PT SHORT TERM GOAL #2   Title  Patient will report improved resting neck pain of </= 2-3 pain level to improve activity tolerance    Time  4    Period  Weeks    Status  New    Target Date  10/26/19      PT SHORT TERM GOAL #3   Title  Patient will be able to perform light household tasks such as cleaning with no difficulty    Time  4    Period  Weeks    Status  New    Target Date  10/26/19      PT SHORT TERM GOAL #4   Title  Patient will exhibit improved ROM of right rotation to 65 deg to allow for improved driving ability without pain or limitation    Time  4    Period  Weeks    Status  New    Target Date  11/09/19        PT Long Term Goals - 09/28/19 1012      PT LONG TERM GOAL #1   Title  Patient will be independent in final HEP to maintain progress made in PT    Time  8    Period  Weeks    Status  New     Target Date  11/23/19      PT LONG TERM GOAL #2   Title  Patient will exhibit improved cervical motion to [name] to allow for ability to perform all grooming and dressing tasks without pain or limitation    Time  8    Period  Weeks    Status  New    Target Date  11/23/19      PT LONG TERM GOAL #3   Title  Patient will exhibit improved periscapular strength of >/= 4/5 MMT and DNF endurance >/= 20 sec to allow for improved postural control and lifting ability    Time  12    Period  Weeks    Status  New    Target Date  12/21/19      PT LONG TERM GOAL #4   Title  Patient will be able to perform heavy household chores such as vaccuuming or lifting objects into overhead cabinets  with no difficulty    Time  12    Period  Weeks    Status  New    Target Date  12/21/19      PT LONG TERM GOAL #5   Title  Patient will report improved function of </= 46% limitation on FOTO    Time  12    Period  Weeks    Status  New    Target Date  12/21/19            Plan - 10/18/19 1057    Clinical Impression Statement  Patient reports improvement in overall symptoms and tolerated progression in postural strengthening well. She does require consistent cueing to avoid shruggin with exercises and we added in chest stretching to improve rounded shoulder posture. She does report improvement in symptoms  following exercise and she was encouraged to perform light exercise throughout day to control symptoms. She would benefit from continued skilled PT to improve pain and progress strength to return to prior level of function.    PT Treatment/Interventions  ADLs/Self Care Home Management;Cryotherapy;Electrical Stimulation;Moist Heat;Traction;Neuromuscular re-education;Therapeutic exercise;Therapeutic activities;Patient/family education;Manual techniques;Dry needling;Passive range of motion;Taping;Spinal Manipulations;Joint Manipulations    PT Next Visit Plan  continue manual, postural strengthening, DNF  endurance, stretches, further dry needling as needed    PT Home Exercise Plan  Upper trap and levator stretching, supine chin tuck, row with yellow    Consulted and Agree with Plan of Care  Patient       Patient will benefit from skilled therapeutic intervention in order to improve the following deficits and impairments:  Decreased range of motion, Pain, Increased muscle spasms, Decreased activity tolerance, Postural dysfunction, Decreased strength, Impaired flexibility  Visit Diagnosis: Cervicalgia  Muscle weakness (generalized)     Problem List Patient Active Problem List   Diagnosis Date Noted  . Cervical myofascial pain syndrome 09/16/2019  . Hypercalcemia 08/22/2019  . Chronic left shoulder pain 08/22/2019  . Hyperlipidemia 03/19/2019  . 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 Essential hypertension 01/12/2019  . Hyperthyroidism 01/12/2019  . Neuropathy 01/12/2019    Hilda Blades, PT, DPT, LAT, ATC 10/18/19  11:43 AM Phone: [phone] Fax: [place] [facility] [address] [place], [place], [address] Phone: [phone]   Fax:  [phone]  Name: Elizabeth [name] MRN: [id number] Date of Birth: Jan 18, 1961

## 2019-10-18 NOTE — Patient Instructions (Signed)
Access Code: [facility]  URL: [email or web address]  Date: 10/18/2019  Prepared by: Hilda Blades   Exercises Gentle Upper Trap Stretch - 2 reps - 20 seconds hold - 2x daily - 7x weekly Gentle Levator Scapulae Stretch - 2 reps - 20 seconds hold - 2x daily - 7x weekly Single Arm Doorway Pec Stretch at 90 Degrees Abduction - 2 reps - 20 seconds hold - 2x daily - 7x weekly Supine Chin Tuck - 10 reps - 3 seconds hold - 2x daily - 7x weekly Standing Bilateral Low Shoulder Row with Anchored Resistance - 10 reps - 3 seconds hold - 2x daily - 7x weekly Scapular Retraction with Resistance Advanced - 10 reps - 3 seconds hold - 2x daily - 7x weekly Supine Shoulder Horizontal Abduction with Resistance - 10 reps - 2x daily - 7x weekly

## 2019-10-25 ENCOUNTER — Ambulatory Visit: Payer: BLUE CROSS/BLUE SHIELD | Admitting: Physical Therapy

## 2019-10-29 ENCOUNTER — Telehealth: Payer: Self-pay | Admitting: Internal Medicine

## 2019-10-29 DIAGNOSIS — E0590 Thyrotoxicosis, unspecified without thyrotoxic crisis or storm: Secondary | ICD-10-CM

## 2019-10-29 NOTE — Telephone Encounter (Signed)
Pt called and wanting to talk to Dr. Derrel Nip about her visit with Dr Gabriel Carina at [facility]

## 2019-10-30 NOTE — Telephone Encounter (Signed)
Spoke with pt and she is wanting to know if there is another Endocrinologist that she can be referred to. Pt is okay with driving to [facility] if she has too.

## 2019-10-31 NOTE — Telephone Encounter (Signed)
Referral is in process as requested 

## 2019-10-31 NOTE — Addendum Note (Signed)
Addended by: Crecencio Mc on: 10/31/2019 12:30 PM   Modules accepted: Orders

## 2019-11-01 ENCOUNTER — Ambulatory Visit (INDEPENDENT_AMBULATORY_CARE_PROVIDER_SITE_OTHER): Payer: BLUE CROSS/BLUE SHIELD | Admitting: Neurology

## 2019-11-01 ENCOUNTER — Ambulatory Visit: Payer: BLUE CROSS/BLUE SHIELD | Admitting: Neurology

## 2019-11-01 ENCOUNTER — Ambulatory Visit: Payer: BLUE CROSS/BLUE SHIELD | Admitting: Physical Therapy

## 2019-11-01 ENCOUNTER — Other Ambulatory Visit: Payer: Self-pay

## 2019-11-01 ENCOUNTER — Encounter: Payer: Self-pay | Admitting: Physical Therapy

## 2019-11-01 DIAGNOSIS — M5412 Radiculopathy, cervical region: Secondary | ICD-10-CM | POA: Diagnosis not present

## 2019-11-01 DIAGNOSIS — R202 Paresthesia of skin: Secondary | ICD-10-CM

## 2019-11-01 DIAGNOSIS — M7918 Myalgia, other site: Secondary | ICD-10-CM

## 2019-11-01 DIAGNOSIS — M6281 Muscle weakness (generalized): Secondary | ICD-10-CM | POA: Diagnosis not present

## 2019-11-01 DIAGNOSIS — M5410 Radiculopathy, site unspecified: Secondary | ICD-10-CM

## 2019-11-01 DIAGNOSIS — R29898 Other symptoms and signs involving the musculoskeletal system: Secondary | ICD-10-CM

## 2019-11-01 DIAGNOSIS — G8929 Other chronic pain: Secondary | ICD-10-CM

## 2019-11-01 DIAGNOSIS — M542 Cervicalgia: Secondary | ICD-10-CM | POA: Diagnosis not present

## 2019-11-01 DIAGNOSIS — Z0289 Encounter for other administrative examinations: Secondary | ICD-10-CM

## 2019-11-01 NOTE — Progress Notes (Signed)
Interval history 11/01/2019: Patient with reported arm weakness here for emg/ncs. Her exam shows focal neurologic findings on exam(left grip weakness, prox LE weakness and right weakness), under the care of Dr. Derrel Nip for 4 months, failed multiple treatments including muscle relaxers and physical therapy.  Patient is here today for EMG nerve conduction study.  EMG nerve conduction study showed very mild right greater than left carpal tunnel syndrome however I do not think this is the etiology of her symptoms.  I did discuss carpal tunnel syndrome with her, pathophysiology, conservative trial including wrist braces, and future treatments including injections and carpal tunnel release.  But again I just do not think these are the cause of her symptoms, I did discuss this with patient, she has been attending physical therapy, she does continue to endorse symptoms, at this point I do think MRI of the cervical spine is medically justified.  Orders Placed This Encounter  Procedures  . MR CERVICAL SPINE WO CONTRAST   A total of 15 minutes was spent on this patient's care, reviewing imaging, past records, recent hospitalization notes and results. 15 was spent on counseling patient on the  1. Weakness of both arms   2. Cervical radiculopathy   3. Tingling of both upper extremities   4. Cervical myofascial pain syndrome   5. Musculoskeletal neck pain    diagnosis and different diagnostic and therapeutic options, counseling and coordination of care, risks and benefitsof management, compliance, or risk factor reduction and education.  This does not include time spent on emg/ncs.

## 2019-11-01 NOTE — Progress Notes (Signed)
See procedure note.

## 2019-11-01 NOTE — Procedures (Signed)
Full Name: Elizabeth [name] Gender: Female MRN #: [id number] Date of Birth: 01-Jul-1961    Visit Date: 11/01/2019 10:13 Age: 59 Years Examining Physician: Sarina Ill, MD  Referring Physician: Crecencio Mc, MD  History: Patient with arm weakness, "heaviness"  Summary: EMG/NCS was performed on the bilateral upper extremities.   The left  Median 2nd Digit orthodromic sensory nerve showed prolonged distal peak latency (3.5 ms, N<3.4). Marland KitchenThe right median/ulnar (palm) comparison nerve showed prolonged distal peak latency (Median Palm, 2.4 ms, N<2.2) and abnormal peak latency difference (Median Palm-Ulnar Palm, 0.4ms, N<0.4) with a relative median delay.  The left median/ulnar (palm) comparison nerve showed prolonged distal peak latency (Median Palm, 2.6 ms, N<2.2) and abnormal peak latency difference (Median Palm-Ulnar Palm, 0.8 ms, N<0.4) with a relative median delay.  All remaining nerves (as indicated in the following tables) were within normal limits.  All muscles (as indicated in the following tables) were within normal limits.      Conclusion: There is electrophysiologic evidence of mild left Carpal Tunnel Syndrome.  Isolated abnormal peak latency difference on right Median/Ulnar Palmar Comparison nerve study does not fulfill electrodiagnostic criteria for CTS but may signify early/mild right median neuropathy at the wrist.    Sarina Ill, MD  [name] [address] [place], [place] [address]  Phone [phone] Fax [phone]  Sarina Ill M.D.  [facility] [place], [place] [address] Tel: [phone] Fax: [phone]         [facility]    Nerve / Sites Muscle Latency Ref. Amplitude Ref. Rel Amp Segments Distance Velocity Ref. Area    ms ms mV mV %  cm m/s m/s mVms  R Median - APB     Wrist APB 3.8 ?4.4 6.7 ?4.0 100 Wrist - APB 7   16.9     Upper arm APB 7.8  6.1  90 Upper arm - Wrist 22 55 ?49 15.1    L Median - APB     Wrist APB 4.0 ?4.4 10.5 ?4.0 100 Wrist - APB 7   25.8     Upper arm APB 8.0  9.7  91.7 Upper arm - Wrist 23 58 ?49 24.7  R Ulnar - ADM     Wrist ADM 2.6 ?3.3 13.7 ?6.0 100 Wrist - ADM 7   38.0     B.Elbow ADM 6.0  12.3  90.2 B.Elbow - Wrist 20 60 ?49 36.2     A.Elbow ADM 7.7  12.0  97.4 A.Elbow - B.Elbow 10 58 ?49 35.3         A.Elbow - Wrist      L Ulnar - ADM     Wrist ADM 3.0 ?3.3 12.1 ?6.0 100 Wrist - ADM 7   32.3     B.Elbow ADM 6.5  11.0  91.1 B.Elbow - Wrist 20 57 ?49 30.7     A.Elbow ADM 8.3  10.3  93.3 A.Elbow - B.Elbow 10 56 ?49 30.1         A.Elbow - Wrist                 SNC    Nerve / Sites Rec. Site Peak Lat Ref.  Amp Ref. Segments Distance Peak Diff Ref.    ms ms V V  cm ms ms  R Median, Ulnar - Transcarpal comparison     Median Palm Wrist 2.4 ?2.2 90 ?35 Median Palm - Wrist 8  Ulnar Palm Wrist 1.8 ?2.2 27 ?12 Ulnar Palm - Wrist 8          Median Palm - Ulnar Palm  0.5 ?0.4  L Median, Ulnar - Transcarpal comparison     Median Palm Wrist 2.6 ?2.2 90 ?35 Median Palm - Wrist 8       Ulnar Palm Wrist 1.8 ?2.2 35 ?12 Ulnar Palm - Wrist 8          Median Palm - Ulnar Palm  0.8 ?0.4  R Median - Orthodromic (Dig II, Mid palm)     Dig II Wrist 3.2 ?3.4 11 ?10 Dig II - Wrist 13    L Median - Orthodromic (Dig II, Mid palm)     Dig II Wrist 3.5 ?3.4 13 ?10 Dig II - Wrist 13    R Ulnar - Orthodromic, (Dig V, Mid palm)     Dig V Wrist 2.7 ?3.1 10 ?5 Dig V - Wrist 11    L Ulnar - Orthodromic, (Dig V, Mid palm)     Dig V Wrist 2.8 ?3.1 13 ?5 Dig V - Wrist 36                   F  Wave    Nerve F Lat Ref.   ms ms  R Ulnar - ADM 27.3 ?32.0  L Ulnar - ADM 27.4 ?32.0         EMG Summary Table    Spontaneous MUAP Recruitment  Muscle IA Fib PSW Fasc Other Amp Dur. Poly Pattern  L. Cervical paraspinals (low) Normal None None None _______ Normal Normal Normal Normal  R. Cervical paraspinals (low) Normal None None None _______ Normal Normal Normal Normal   L. Deltoid Normal None None None _______ Normal Normal Normal Normal  R. Deltoid Normal None None None _______ Normal Normal Normal Normal  L. Triceps brachii Normal None None None _______ Normal Normal Normal Normal  R. Triceps brachii Normal None None None _______ Normal Normal Normal Normal  L. Pronator teres Normal None None None _______ Normal Normal Normal Normal  R. Pronator teres Normal None None None _______ Normal Normal Normal Normal  L. First dorsal interosseous Normal None None None _______ Normal Normal Normal Normal  R. First dorsal interosseous Normal None None None _______ Normal Normal Normal Normal  L. Opponens pollicis Normal None None None _______ Normal Normal Normal Normal  R. Opponens pollicis Normal None None None _______ Normal Normal Normal Normal

## 2019-11-01 NOTE — Progress Notes (Signed)
Full Name: Elizabeth [name] Gender: Female MRN #: [id number] Date of Birth: Jun 14, 1961    Visit Date: 11/01/2019 10:13 Age: 59 Years Examining Physician: Sarina Ill, MD  Referring Physician: Crecencio Mc, MD  History: Patient with arm weakness, "heaviness"  Summary: EMG/NCS was performed on the bilateral upper extremities.   The left  Median 2nd Digit orthodromic sensory nerve showed prolonged distal peak latency (3.5 ms, N<3.4). Marland KitchenThe right median/ulnar (palm) comparison nerve showed prolonged distal peak latency (Median Palm, 2.4 ms, N<2.2) and abnormal peak latency difference (Median Palm-Ulnar Palm, 0.50ms, N<0.4) with a relative median delay.  The left median/ulnar (palm) comparison nerve showed prolonged distal peak latency (Median Palm, 2.6 ms, N<2.2) and abnormal peak latency difference (Median Palm-Ulnar Palm, 0.8 ms, N<0.4) with a relative median delay.  All remaining nerves (as indicated in the following tables) were within normal limits.  All muscles (as indicated in the following tables) were within normal limits.      Conclusion: There is electrophysiologic evidence of mild left Carpal Tunnel Syndrome.  Isolated abnormal peak latency difference on right Median/Ulnar Palmar Comparison nerve study does not fulfill electrodiagnostic criteria for CTS but may signify early/mild right median neuropathy at the wrist.    Sarina Ill M.D.  [facility] [place], [place] [address] Tel: [phone] Fax: [phone]         [facility]    Nerve / Sites Muscle Latency Ref. Amplitude Ref. Rel Amp Segments Distance Velocity Ref. Area    ms ms mV mV %  cm m/s m/s mVms  R Median - APB     Wrist APB 3.8 ?4.4 6.7 ?4.0 100 Wrist - APB 7   16.9     Upper arm APB 7.8  6.1  90 Upper arm - Wrist 22 55 ?49 15.1  L Median - APB     Wrist APB 4.0 ?4.4 10.5 ?4.0 100 Wrist - APB 7   25.8     Upper arm APB 8.0  9.7  91.7 Upper arm - Wrist 23 58 ?49 24.7  R  Ulnar - ADM     Wrist ADM 2.6 ?3.3 13.7 ?6.0 100 Wrist - ADM 7   38.0     B.Elbow ADM 6.0  12.3  90.2 B.Elbow - Wrist 20 60 ?49 36.2     A.Elbow ADM 7.7  12.0  97.4 A.Elbow - B.Elbow 10 58 ?49 35.3         A.Elbow - Wrist      L Ulnar - ADM     Wrist ADM 3.0 ?3.3 12.1 ?6.0 100 Wrist - ADM 7   32.3     B.Elbow ADM 6.5  11.0  91.1 B.Elbow - Wrist 20 57 ?49 30.7     A.Elbow ADM 8.3  10.3  93.3 A.Elbow - B.Elbow 10 56 ?49 30.1         A.Elbow - Wrist                 SNC    Nerve / Sites Rec. Site Peak Lat Ref.  Amp Ref. Segments Distance Peak Diff Ref.    ms ms V V  cm ms ms  R Median, Ulnar - Transcarpal comparison     Median Palm Wrist 2.4 ?2.2 90 ?35 Median Palm - Wrist 8       Ulnar Palm Wrist 1.8 ?2.2 27 ?12 Ulnar Palm - Wrist 8  Median Palm - Ulnar Palm  0.5 ?0.4  L Median, Ulnar - Transcarpal comparison     Median Palm Wrist 2.6 ?2.2 90 ?35 Median Palm - Wrist 8       Ulnar Palm Wrist 1.8 ?2.2 35 ?12 Ulnar Palm - Wrist 8          Median Palm - Ulnar Palm  0.8 ?0.4  R Median - Orthodromic (Dig II, Mid palm)     Dig II Wrist 3.2 ?3.4 11 ?10 Dig II - Wrist 13    L Median - Orthodromic (Dig II, Mid palm)     Dig II Wrist 3.5 ?3.4 13 ?10 Dig II - Wrist 13    R Ulnar - Orthodromic, (Dig V, Mid palm)     Dig V Wrist 2.7 ?3.1 10 ?5 Dig V - Wrist 11    L Ulnar - Orthodromic, (Dig V, Mid palm)     Dig V Wrist 2.8 ?3.1 13 ?5 Dig V - Wrist 17                   F  Wave    Nerve F Lat Ref.   ms ms  R Ulnar - ADM 27.3 ?32.0  L Ulnar - ADM 27.4 ?32.0         EMG Summary Table    Spontaneous MUAP Recruitment  Muscle IA Fib PSW Fasc Other Amp Dur. Poly Pattern  L. Cervical paraspinals (low) Normal None None None _______ Normal Normal Normal Normal  R. Cervical paraspinals (low) Normal None None None _______ Normal Normal Normal Normal  L. Deltoid Normal None None None _______ Normal Normal Normal Normal  R. Deltoid Normal None None None _______ Normal Normal Normal Normal  L.  Triceps brachii Normal None None None _______ Normal Normal Normal Normal  R. Triceps brachii Normal None None None _______ Normal Normal Normal Normal  L. Pronator teres Normal None None None _______ Normal Normal Normal Normal  R. Pronator teres Normal None None None _______ Normal Normal Normal Normal  L. First dorsal interosseous Normal None None None _______ Normal Normal Normal Normal  R. First dorsal interosseous Normal None None None _______ Normal Normal Normal Normal  L. Opponens pollicis Normal None None None _______ Normal Normal Normal Normal  R. Opponens pollicis Normal None None None _______ Normal Normal Normal Normal

## 2019-11-01 NOTE — Therapy (Signed)
Elizabeth [name], [place], [address] Phone: [phone]   Fax:  [phone]  Physical Therapy Treatment  Patient Details  Name: Elizabeth [name] MRN: [id number] Date of Birth: 07-Apr-1961 Referring Provider (PT): Melvenia Beam, MD   Encounter Date: 11/01/2019  PT End of Session - 11/01/19 0844    Visit Number  4    Number of Visits  8    Date for PT Re-Evaluation  11/23/19    Authorization Type  BCBS    PT Start Time  [phone]    PT Stop Time  0912    PT Time Calculation (min)  30 min    Activity Tolerance  Patient tolerated treatment well    Behavior During Therapy  [facility] for tasks assessed/performed       Past Medical History:  Diagnosis Date  . Gout   . Hypertension   . Thyroid disease     Past Surgical History:  Procedure Laterality Date  . ENDOMETRIAL ABLATION  2010    There were no vitals filed for this visit.      [facility] PT Assessment - 11/01/19 0001      Assessment   Medical Diagnosis  Cervical myofascial pain syndrome    Referring Provider (PT)  Melvenia Beam, MD      Observation/Other Assessments   Focus on Therapeutic Outcomes (FOTO)   45% limitation      AROM   Cervical - Right Rotation  67    Cervical - Left Rotation  67                   [facility] Adult PT Treatment/Exercise - 11/01/19 0001      Self-Care   Self-Care  Other Self-Care Comments    Other Self-Care Comments   FOTO review      Exercises   Exercises  Neck      Neck Exercises: Machines for Strengthening   UBE (Upper Arm Bike)  L1 x 4 min (2 fwd/bwd)      Neck Exercises: Theraband   Shoulder Extension  15 reps;Red   2 sets   Rows  15 reps;Red   2 sets   Shoulder External Rotation  15 reps   2 sets   Shoulder External Rotation Limitations  yellow, bilateral      Neck Exercises: Stretches   Upper Trapezius Stretch  3 reps;20 seconds    Chest Stretch  3 reps;20 seconds    Chest Stretch  Limitations  doorway with elbow bent at 90 deg abd             PT Education - 11/01/19 0842    Education Details  HEP    Person(s) Educated  Patient    Methods  Explanation;Demonstration;Verbal cues    Comprehension  Verbalized understanding;Returned demonstration;Verbal cues required;Need further instruction       PT Short Term Goals - 11/01/19 0846      PT SHORT TERM GOAL #1   Title  Patient will be I with initial HEP to progress in PT    Time  4    Period  Weeks    Status  On-going    Target Date  10/26/19      PT SHORT TERM GOAL #2   Title  Patient will report improved resting neck pain of </= 2-3 pain level to improve activity tolerance    Time  4    Period  Weeks    Status  Achieved  Target Date  10/26/19      PT SHORT TERM GOAL #3   Title  Patient will be able to perform light household tasks such as cleaning with no difficulty    Time  4    Period  Weeks    Status  Achieved    Target Date  10/26/19      PT SHORT TERM GOAL #4   Title  Patient will exhibit improved ROM of right rotation to 65 deg to allow for improved driving ability without pain or limitation    Baseline  67    Time  4    Period  Weeks    Status  Achieved    Target Date  10/26/19        PT Long Term Goals - 09/28/19 1012      PT LONG TERM GOAL #1   Title  Patient will be independent in final HEP to maintain progress made in PT    Time  8    Period  Weeks    Status  New    Target Date  11/23/19      PT LONG TERM GOAL #2   Title  Patient will exhibit improved cervical motion to [facility] to allow for ability to perform all grooming and dressing tasks without pain or limitation    Time  8    Period  Weeks    Status  New    Target Date  11/23/19      PT LONG TERM GOAL #3   Title  Patient will exhibit improved periscapular strength of >/= 4/5 MMT and DNF endurance >/= 20 sec to allow for improved postural control and lifting ability    Time  12    Period  Weeks    Status  New     Target Date  12/21/19      PT LONG TERM GOAL #4   Title  Patient will be able to perform heavy household chores such as vaccuuming or lifting objects into overhead cabinets  with no difficulty    Time  12    Period  Weeks    Status  New    Target Date  12/21/19      PT LONG TERM GOAL #5   Title  Patient will report improved function of </= 46% limitation on FOTO    Time  12    Period  Weeks    Status  New    Target Date  12/21/19            Plan - 11/01/19 0845    Clinical Impression Statement  Patient is progressing well and exhibits improved cervical rotation and functional level. She did not report any cervical pain or arm heaviness this visit. She does continue to report occasional upper trap tightness and she requires consistent cueing to avoid shrugging with exercises. She would benefit from continued skilled PT to reduce upper trap tightness and progress postural control.    PT Treatment/Interventions  ADLs/Self Care Home Management;Cryotherapy;Electrical Stimulation;Moist Heat;Traction;Neuromuscular re-education;Therapeutic exercise;Therapeutic activities;Patient/family education;Manual techniques;Dry needling;Passive range of motion;Taping;Spinal Manipulations;Joint Manipulations    PT Next Visit Plan  continue manual, postural strengthening, DNF endurance, stretches, further dry needling as needed    PT Home Exercise Plan  Upper trap and levator stretching, supine chin tuck, row with red, extension with red, supine horiz abd with yellow    Consulted and Agree with Plan of Care  Patient       Patient will benefit  from skilled therapeutic intervention in order to improve the following deficits and impairments:  Decreased range of motion, Pain, Increased muscle spasms, Decreased activity tolerance, Postural dysfunction, Decreased strength, Impaired flexibility  Visit Diagnosis: Cervicalgia  Muscle weakness (generalized)     Problem List Patient Active Problem List    Diagnosis Date Noted  . Cervical myofascial pain syndrome 09/16/2019  . Hypercalcemia 08/22/2019  . Chronic left shoulder pain 08/22/2019  . Hyperlipidemia 03/19/2019  . 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 Essential hypertension 01/12/2019  . Hyperthyroidism 01/12/2019  . Neuropathy 01/12/2019    Hilda Blades, PT, DPT, LAT, ATC 11/01/19  9:26 AM Phone: [phone] Fax: [facility] [address] [place], [place], [address] Phone: [phone]   Fax:  [phone]  Name: Crissy Antill MRN: [id number] Date of Birth: Sep 19, 1960

## 2019-11-05 NOTE — Telephone Encounter (Signed)
Pt called to say that she was going to stay with Dr. Kelton Pillar for endo

## 2019-11-05 NOTE — Telephone Encounter (Signed)
FYI

## 2019-11-05 NOTE — Telephone Encounter (Signed)
Please clarify.  She was seeing [facility] for Endocrine If she want sus to cancel the referral let us know

## 2019-11-07 NOTE — Telephone Encounter (Signed)
Spoke with pt and she stated that she was confused as well. She does not need another referral for Endo. She stated that she has been seeing [facility] and wants to stay with that doctor.

## 2019-11-07 NOTE — Telephone Encounter (Signed)
Since the patient has had the NCV/EMG I faxed the notes to set up a 1st level of appeal.

## 2019-11-09 ENCOUNTER — Other Ambulatory Visit (INDEPENDENT_AMBULATORY_CARE_PROVIDER_SITE_OTHER): Payer: BLUE CROSS/BLUE SHIELD

## 2019-11-09 LAB — BASIC METABOLIC PANEL
BUN: 15 mg/dL (ref 6–23)
CO2: 25 mEq/L (ref 19–32)
Calcium: 11.1 mg/dL — ABNORMAL HIGH (ref 8.4–10.5)
Chloride: 107 mEq/L (ref 96–112)
Creatinine, Ser: 0.92 mg/dL (ref 0.40–1.20)
GFR: 75.45 mL/min (ref >60.00)
Glucose, Bld: 107 mg/dL — ABNORMAL HIGH (ref 70–99)
Potassium: 3.4 mEq/L — ABNORMAL LOW (ref 3.5–5.1)
Sodium: 138 mEq/L (ref 135–145)

## 2019-11-09 LAB — ALBUMIN: Albumin: 4 g/dL (ref 3.5–5.2)

## 2019-11-09 LAB — VITAMIN D 25 HYDROXY (VIT D DEFICIENCY, FRACTURES): VITD: 30.33 ng/mL (ref 30.00–100.00)

## 2019-11-10 LAB — CALCIUM, URINE, 24 HOUR: Calcium, 24H Urine: 160 mg/24 h

## 2019-11-12 LAB — CREATININE, URINE, 24 HOUR: Creatinine, 24H Ur: 0.99 g/(24.h) (ref 0.50–2.15)

## 2019-11-12 LAB — PARATHYROID HORMONE, INTACT (NO CA): PTH: 119 pg/mL — ABNORMAL HIGH (ref 14–64)

## 2019-11-14 ENCOUNTER — Telehealth: Payer: Self-pay | Admitting: Internal Medicine

## 2019-11-14 NOTE — Telephone Encounter (Signed)
no to the covid questions MR Cervical spine wo contrast Dr. Ihor Dow [id number] (exp. 11/08/19 to 05/06/20). Patient is scheduled at [facility] for 11/21/19.

## 2019-11-14 NOTE — Telephone Encounter (Signed)
   Ca/Cr ratio 0.013 this is inconclusive for primary hyperparathyroidism and [place].  I suspect that she does have primary hyper para and her low calcium excretion is due to low dietary calcium intake.     We will continue to monitor   Cortland West, MD  [facility]  [facility] [address] [place], [place] [address] Phone: [phone] FAX: [phone]

## 2019-11-16 ENCOUNTER — Ambulatory Visit: Payer: BLUE CROSS/BLUE SHIELD | Attending: Neurology | Admitting: Physical Therapy

## 2019-11-16 ENCOUNTER — Other Ambulatory Visit: Payer: Self-pay

## 2019-11-16 ENCOUNTER — Encounter: Payer: Self-pay | Admitting: Physical Therapy

## 2019-11-16 DIAGNOSIS — M6281 Muscle weakness (generalized): Secondary | ICD-10-CM | POA: Insufficient documentation

## 2019-11-16 DIAGNOSIS — M542 Cervicalgia: Secondary | ICD-10-CM | POA: Diagnosis not present

## 2019-11-16 NOTE — Therapy (Signed)
[place], [place], [address] Phone: [phone]   Fax:  [phone]  Physical Therapy Treatment  Patient Details  Name: Elizabeth [name] MRN: [id number] Date of Birth: [birth year] Referring Provider (PT): Melvenia Beam, MD   Encounter Date: 11/16/2019  PT End of Session - 11/16/19 1011    Visit Number  5    Number of Visits  8    Date for PT Re-Evaluation  11/23/19    Authorization Type  BCBS    PT Start Time  [phone]   late arrival   PT Stop Time  1015   10 min diect tx. minutes, time spent for dry needling and estim not included in direct minutes   PT Time Calculation (min)  26 min    Activity Tolerance  Patient tolerated treatment well    Behavior During Therapy  [facility] for tasks assessed/performed       Past Medical History:  Diagnosis Date  . Gout   . Hypertension   . Thyroid disease     Past Surgical History:  Procedure Laterality Date  . ENDOMETRIAL ABLATION  2010    There were no vitals filed for this visit.  Subjective Assessment - 11/16/19 1008    Subjective  19 min late to start tx. due to pt. arriving late due to traffic issues. She saw Dr. Jaynee Eagles for follow up and had EMG/NCS with findings of mild carpal tunnel syndrome. Cervical MRI has been ordered and is pending for next week.    Currently in Pain?  Yes    Pain Score  6     Pain Location  Neck    Pain Orientation  Right    Pain Descriptors / Indicators  Sore;Cramping    Pain Type  Chronic pain    Pain Radiating Towards  right upper trapezius region    Pain Onset  More than a month ago    Pain Frequency  Intermittent    Aggravating Factors   turning head to right    Pain Relieving Factors  no speciifc eases noted                       [facility] Adult PT Treatment/Exercise - 11/16/19 0001      Manual Therapy   Soft tissue mobilization  seated STM right upper trapezius region      Neck Exercises: Stretches    Upper Trapezius Stretch  Right;2 reps;20 seconds       Trigger Point Dry Needling - 11/16/19 0001    Consent Given?  Yes    Muscles Treated Head and Neck  Upper trapezius   right side   Dry Needling Comments  in left sidelying and prone with 32 gauge 30 mm needles    Electrical Stimulation Performed with Dry Needling  Yes    E-stim with Dry Needling Details  TENS 20 pps x 10 minutes    Upper Trapezius Response  Twitch reponse elicited           PT Education - 11/16/19 1011    Education Details  estim    Person(s) Educated  Patient    Methods  Explanation    Comprehension  Verbalized understanding       PT Short Term Goals - 11/01/19 0846      PT SHORT TERM GOAL #1   Title  Patient will be I with initial HEP to progress in PT    Time  4  Period  Weeks    Status  On-going    Target Date  10/26/19      PT SHORT TERM GOAL #2   Title  Patient will report improved resting neck pain of </= 2-3 pain level to improve activity tolerance    Time  4    Period  Weeks    Status  Achieved    Target Date  10/26/19      PT SHORT TERM GOAL #3   Title  Patient will be able to perform light household tasks such as cleaning with no difficulty    Time  4    Period  Weeks    Status  Achieved    Target Date  10/26/19      PT SHORT TERM GOAL #4   Title  Patient will exhibit improved ROM of right rotation to 65 deg to allow for improved driving ability without pain or limitation    Baseline  67    Time  4    Period  Weeks    Status  Achieved    Target Date  10/26/19        PT Long Term Goals - 09/28/19 1012      PT LONG TERM GOAL #1   Title  Patient will be independent in final HEP to maintain progress made in PT    Time  8    Period  Weeks    Status  New    Target Date  11/23/19      PT LONG TERM GOAL #2   Title  Patient will exhibit improved cervical motion to [facility] to allow for ability to perform all grooming and dressing tasks without pain or limitation    Time  8     Period  Weeks    Status  New    Target Date  11/23/19      PT LONG TERM GOAL #3   Title  Patient will exhibit improved periscapular strength of >/= 4/5 MMT and DNF endurance >/= 20 sec to allow for improved postural control and lifting ability    Time  12    Period  Weeks    Status  New    Target Date  12/21/19      PT LONG TERM GOAL #4   Title  Patient will be able to perform heavy household chores such as vaccuuming or lifting objects into overhead cabinets  with no difficulty    Time  12    Period  Weeks    Status  New    Target Date  12/21/19      PT LONG TERM GOAL #5   Title  Patient will report improved function of </= 46% limitation on FOTO    Time  12    Period  Weeks    Status  New    Target Date  12/21/19            Plan - 11/16/19 1012    Clinical Impression Statement  Brief tx. today due to late arrival. Responds well to addition dry needling for decreased upper trap region tension. Fair status with continued pain and pending MRI next week.    Personal Factors and Comorbidities  Past/Current Experience;Time since onset of injury/illness/exacerbation    Examination-Activity Limitations  Carry;Lift;Sleep    Examination-Participation Restrictions  Cleaning;Meal Prep;Community Activity;Laundry;Yard Work    Stability/Clinical Decision Making  Evolving/Moderate complexity    Clinical Decision Making  Moderate    Rehab Potential  Good    PT Frequency  1x / week    PT Duration  12 weeks    PT Treatment/Interventions  ADLs/Self Care Home Management;Cryotherapy;Electrical Stimulation;Moist Heat;Traction;Neuromuscular re-education;Therapeutic exercise;Therapeutic activities;Patient/family education;Manual techniques;Dry needling;Passive range of motion;Taping;Spinal Manipulations;Joint Manipulations    PT Next Visit Plan  continue manual, postural strengthening, DNF endurance, stretches, further dry needling as needed    PT Home Exercise Plan  Upper trap and levator  stretching, supine chin tuck, row with red, extension with red, supine horiz abd with yellow    Consulted and Agree with Plan of Care  Patient       Patient will benefit from skilled therapeutic intervention in order to improve the following deficits and impairments:  Decreased range of motion, Pain, Increased muscle spasms, Decreased activity tolerance, Postural dysfunction, Decreased strength, Impaired flexibility  Visit Diagnosis: Cervicalgia  Muscle weakness (generalized)     Problem List Patient Active Problem List   Diagnosis Date Noted  . Cervical myofascial pain syndrome 09/16/2019  . Hypercalcemia 08/22/2019  . Chronic left shoulder pain 08/22/2019  . Hyperlipidemia 03/19/2019  . 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 Essential hypertension 01/12/2019  . Hyperthyroidism 01/12/2019  . Neuropathy 01/12/2019    Beaulah Dinning, PT, DPT 11/16/19 10:14 AM  [facility] [address] [place], [place], [address] Phone: [phone]   Fax:  [phone]  Name: Savreen Mahony MRN: [id number] Date of Birth: 1960-11-13

## 2019-11-17 ENCOUNTER — Other Ambulatory Visit: Payer: Self-pay | Admitting: Family Medicine

## 2019-11-17 DIAGNOSIS — E0590 Thyrotoxicosis, unspecified without thyrotoxic crisis or storm: Secondary | ICD-10-CM

## 2019-11-21 ENCOUNTER — Ambulatory Visit (INDEPENDENT_AMBULATORY_CARE_PROVIDER_SITE_OTHER): Payer: BLUE CROSS/BLUE SHIELD

## 2019-11-21 DIAGNOSIS — M5412 Radiculopathy, cervical region: Secondary | ICD-10-CM

## 2019-11-21 DIAGNOSIS — R202 Paresthesia of skin: Secondary | ICD-10-CM | POA: Diagnosis not present

## 2019-11-21 DIAGNOSIS — R29898 Other symptoms and signs involving the musculoskeletal system: Secondary | ICD-10-CM | POA: Diagnosis not present

## 2019-11-23 ENCOUNTER — Ambulatory Visit: Payer: BLUE CROSS/BLUE SHIELD | Admitting: Physical Therapy

## 2019-11-23 ENCOUNTER — Encounter: Payer: Self-pay | Admitting: Physical Therapy

## 2019-11-23 ENCOUNTER — Other Ambulatory Visit: Payer: Self-pay

## 2019-11-23 DIAGNOSIS — M542 Cervicalgia: Secondary | ICD-10-CM | POA: Diagnosis not present

## 2019-11-23 DIAGNOSIS — M6281 Muscle weakness (generalized): Secondary | ICD-10-CM | POA: Diagnosis not present

## 2019-11-23 NOTE — Therapy (Signed)
[place], [place], [address] Phone: [phone]   Fax:  [phone]  Physical Therapy Treatment / Re-cert   Progress Note Reporting Period 09/28/2019 to 11/23/2019  See note below for Objective Data and Assessment of Progress/Goals.    Patient Details  Name: Elizabeth [name] MRN: [id number] Date of Birth: 1961-03-07 Referring Provider (PT): Melvenia Beam, MD   Encounter Date: 11/23/2019  PT End of Session - 11/23/19 0928    Visit Number  6    Number of Visits  8    Date for PT Re-Evaluation  01/18/20    Authorization Type  BC[employer]    PT Start Time  [phone]   patient arrived late   PT Stop Time  1010    PT Time Calculation (min)  48 min    Activity Tolerance  Patient tolerated treatment well    Behavior During Therapy  [facility] for tasks assessed/performed       Past Medical History:  Diagnosis Date  . Gout   . Hypertension   . Thyroid disease     Past Surgical History:  Procedure Laterality Date  . ENDOMETRIAL ABLATION  2010    There were no vitals filed for this visit.  Subjective Assessment - 11/23/19 0924    Subjective  Patient reports reports she is still having pain and tightness in the upper trap region. Today it is more in the left side, but it switches up on her.    Patient Stated Goals  Get rid of neck pain and stop episodes of weakness    Currently in Pain?  Yes    Pain Score  6     Pain Location  Neck    Pain Orientation  Left    Pain Descriptors / Indicators  Cramping    Pain Type  Chronic pain    Pain Radiating Towards  left upper trap region    Pain Onset  More than a month ago    Pain Frequency  Intermittent         [facility] PT Assessment - 11/23/19 0001      Assessment   Medical Diagnosis  Cervical myofascial pain syndrome    Referring Provider (PT)  Melvenia Beam, MD      Balance Screen   Has the patient fallen in the past 6 months  No      Prior Function   Level  of Independence  Independent      Observation/Other Assessments   Focus on Therapeutic Outcomes (FOTO)   48% limitation      AROM   Cervical Flexion  40    Cervical Extension  45    Cervical - Right Side Bend  40   contralateral upper trap tightness/pain   Cervical - Left Side Bend  40   contralateral upper trap tightness/pain   Cervical - Right Rotation  70    Cervical - Left Rotation  70      Strength   Right Shoulder Flexion  4+/5    Right Shoulder Extension  4+/5    Right Shoulder ABduction  4+/5    Right Shoulder Internal Rotation  5/5    Right Shoulder External Rotation  4/5    Right Shoulder Horizontal ABduction  4/5    Left Shoulder Flexion  4+/5    Left Shoulder Extension  4+/5    Left Shoulder ABduction  4+/5    Left Shoulder Internal Rotation  5/5  Left Shoulder External Rotation  4/5    Left Shoulder Horizontal ABduction  4/5                   [facility] Adult PT Treatment/Exercise - 11/23/19 0001      Self-Care   Self-Care  Other Self-Care Comments    Other Self-Care Comments   FOTO review, posture and relaxation techniques to reduce tension in shoulders and neck      Exercises   Exercises  Neck      Neck Exercises: Theraband   Shoulder Extension  15 reps;Red    Rows  15 reps;Red    Shoulder External Rotation  15 reps;Red    Shoulder External Rotation Limitations  bilat      Modalities   Modalities  Electrical Stimulation;Moist Heat   performed after exercise     Moist Heat Therapy   Number Minutes Moist Heat  10 Minutes    Moist Heat Location  Cervical   bilat upper trap     Electrical Stimulation   Electrical Stimulation Location  Bilat upper trap    Electrical Stimulation Action  Pre-mod 80-150 x 10 min    Electrical Stimulation Parameters  Patient tolerance for intensity    Electrical Stimulation Goals  Tone;Pain      Neck Exercises: Stretches   Upper Trapezius Stretch  2 reps;20 seconds    Levator Stretch  2 reps;20 seconds     Chest Stretch  2 reps;20 seconds    Chest Stretch Limitations  doorway at 60 and 90 deg abduction             PT Education - 11/23/19 0928    Education Details  HEP    Person(s) Educated  Patient    Methods  Explanation;Demonstration;Verbal cues    Comprehension  Verbalized understanding;Returned demonstration;Verbal cues required;Need further instruction       PT Short Term Goals - 11/23/19 1023      PT SHORT TERM GOAL #1   Title  Patient will be I with initial HEP to progress in PT    Time  4    Period  Weeks    Status  On-going    Target Date  10/26/19      PT SHORT TERM GOAL #2   Title  Patient will report improved resting neck pain of </= 2-3 pain level to improve activity tolerance    Baseline  Previously achieved but having more pain now    Time  4    Period  Weeks    Status  On-going    Target Date  12/21/19      PT SHORT TERM GOAL #3   Title  Patient will be able to perform light household tasks such as cleaning with no difficulty    Time  4    Period  Weeks    Status  Achieved    Target Date  10/26/19      PT SHORT TERM GOAL #4   Title  Patient will exhibit improved ROM of right rotation to 65 deg to allow for improved driving ability without pain or limitation    Baseline  67    Time  4    Period  Weeks    Status  Achieved    Target Date  10/26/19        PT Long Term Goals - 11/23/19 1018      PT LONG TERM GOAL #1   Title  Patient will be independent  in final HEP to maintain progress made in PT    Time  8    Period  Weeks    Status  On-going    Target Date  01/18/20      PT LONG TERM GOAL #2   Title  Patient will exhibit improved cervical motion to [facility] to allow for ability to perform all grooming and dressing tasks without pain or limitation    Baseline  Progressing toward goal, see flowsheet    Time  8    Period  Weeks    Status  On-going    Target Date  01/18/20      PT LONG TERM GOAL #3   Title  Patient will exhibit improved  periscapular strength of >/= 4/5 MMT and DNF endurance >/= 20 sec to allow for improved postural control and lifting ability    Baseline  Continues to exhibit significant DNF endurance deficit    Time  8    Period  Weeks    Status  On-going    Target Date  01/18/20      PT LONG TERM GOAL #4   Title  Patient will be able to perform heavy household chores such as vaccuuming or lifting objects into overhead cabinets  with no difficulty    Time  8    Period  Weeks    Status  On-going    Target Date  01/18/20      PT LONG TERM GOAL #5   Title  Patient will report improved function of </= 46% limitation on FOTO    Baseline  She did exceed this goal on 11/01/2019 at 45% but is currently at 48% this visit    Time  8    Period  Weeks    Status  On-going    Target Date  01/18/20            Plan - 11/23/19 0929    Clinical Impression Statement  Patient continues to report cervical/upper trap pain and tightness. Her pain switches from side to side so its unclear whether it is cervical referral or muscular pain related. She does not exhibit any positive radicular tests. Her range of motion and shoulder stength has improved since evaluation and she is reports high functional level via FOTO, but has exhibited slight decline since 11/01/2019. She exhibits postural strength deficit and poor DNF endurance. She also exhibit increased muscle tension of bilat upper traps and difficulty turning traps off during exercises so she requires consistent cueing to avoid shrugging. Finished therapy with e-stim and heat to reduce muscle tightness and cramping sensation. She would benefit from continued skilled PT to reduce upper trap tightness and neck pain so she can perform activities without limitation.    Personal Factors and Comorbidities  Past/Current Experience;Time since onset of injury/illness/exacerbation    Examination-Activity Limitations  Carry;Lift;Sleep    Examination-Participation Restrictions   Cleaning;Meal Prep;Community Activity;Laundry;Yard Work    PT Frequency  1x / week    PT Duration  8 weeks    PT Treatment/Interventions  ADLs/Self Care Home Management;Cryotherapy;Electrical Stimulation;Moist Heat;Traction;Neuromuscular re-education;Therapeutic exercise;Therapeutic activities;Patient/family education;Manual techniques;Dry needling;Passive range of motion;Taping;Spinal Manipulations;Joint Manipulations    PT Next Visit Plan  continue manual, postural strengthening, DNF endurance, stretches, further dry needling as needed    PT Home Exercise Plan  Upper trap and levator stretching, doorway pec stretch, supine chin tuck, row with red, extension with red, supine horiz abd with yellow, double ER with yellow    Consulted and  Agree with Plan of Care  Patient       Patient will benefit from skilled therapeutic intervention in order to improve the following deficits and impairments:  Decreased range of motion, Pain, Increased muscle spasms, Decreased activity tolerance, Postural dysfunction, Decreased strength, Impaired flexibility  Visit Diagnosis: Cervicalgia  Muscle weakness (generalized)     Problem List Patient Active Problem List   Diagnosis Date Noted  . Cervical myofascial pain syndrome 09/16/2019  . Hypercalcemia 08/22/2019  . Chronic left shoulder pain 08/22/2019  . Hyperlipidemia 03/19/2019  . 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 Essential hypertension 01/12/2019  . Hyperthyroidism 01/12/2019  . Neuropathy 01/12/2019    Hilda Blades, PT, DPT, LAT, ATC 11/23/19  10:29 AM Phone: [phone] Fax: [facility] [address] [place], [place], [address] Phone: [phone]   Fax:  [phone]  Name: Elizabeth [name] MRN: [id number] Date of Birth: 1961-02-17

## 2019-11-23 NOTE — Patient Instructions (Signed)
Access Code: [facility] URL: [email or web address] Date: 11/23/2019 Prepared by: Hilda Blades  Exercises Gentle Upper Trap Stretch - 2 x daily - 7 x weekly - 2 reps - 20 seconds hold Gentle Levator Scapulae Stretch - 2 x daily - 7 x weekly - 2 reps - 20 seconds hold Single Arm Doorway Pec Stretch at 90 Degrees Abduction - 2 x daily - 7 x weekly - 2 reps - 20 seconds hold Supine Chin Tuck - 2 x daily - 7 x weekly - 10 reps - 5 seconds hold Standing Bilateral Low Shoulder Row with Anchored Resistance - 2 x daily - 7 x weekly - 10 reps - 3 seconds hold Scapular Retraction with Resistance Advanced - 2 x daily - 7 x weekly - 15 reps - 3 seconds hold Supine Shoulder Horizontal Abduction with Resistance - 2 x daily - 7 x weekly - 15 reps Shoulder External Rotation and Scapular Retraction with Resistance - 2 x daily - 7 x weekly - 15 reps

## 2019-11-30 ENCOUNTER — Encounter: Payer: Self-pay | Admitting: Physical Therapy

## 2019-11-30 ENCOUNTER — Other Ambulatory Visit: Payer: Self-pay

## 2019-11-30 ENCOUNTER — Ambulatory Visit: Payer: BLUE CROSS/BLUE SHIELD | Admitting: Physical Therapy

## 2019-11-30 DIAGNOSIS — M542 Cervicalgia: Secondary | ICD-10-CM

## 2019-11-30 DIAGNOSIS — M6281 Muscle weakness (generalized): Secondary | ICD-10-CM

## 2019-11-30 NOTE — Therapy (Signed)
Elizabeth [name], [place], [address] Phone: [phone]   Fax:  [phone]  Physical Therapy Treatment  Patient Details  Name: Elizabeth [name] MRN: [id number] Date of Birth: 09/11/1960 Referring Provider (PT): Melvenia Beam, MD   Encounter Date: 11/30/2019  PT End of Session - 11/30/19 0922    Visit Number  7    Number of Visits  14    Date for PT Re-Evaluation  01/18/20    Authorization Type  BCBS    PT Start Time  0915    PT Stop Time  0955    PT Time Calculation (min)  40 min    Activity Tolerance  Patient tolerated treatment well    Behavior During Therapy  [facility] for tasks assessed/performed       Past Medical History:  Diagnosis Date  . Gout   . Hypertension   . Thyroid disease     Past Surgical History:  Procedure Laterality Date  . ENDOMETRIAL ABLATION  2010    There were no vitals filed for this visit.  Subjective Assessment - 11/30/19 0921    Subjective  Patient reports she is still having neck/shoulder tightness that switches from side-to-side. Today is more on the left side.    Patient Stated Goals  Get rid of neck pain and stop episodes of weakness    Currently in Pain?  Yes    Pain Score  7     Pain Location  Neck    Pain Orientation  Left    Pain Descriptors / Indicators  Tightness    Pain Type  Chronic pain    Pain Onset  More than a month ago    Pain Frequency  Intermittent                       [facility] Adult PT Treatment/Exercise - 11/30/19 0001      Exercises   Exercises  Neck      Neck Exercises: Theraband   Shoulder Extension  15 reps;Red   2 sets   Rows  15 reps;Green   2 sets   Shoulder External Rotation  15 reps   2 sets   Shoulder External Rotation Limitations  bilat with yellow    Horizontal ABduction  15 reps   2 sets   Horizontal ABduction Limitations  supine with yellow      Neck Exercises: Seated   Neck Retraction  10 reps;3 secs      Neck Exercises: Stretches   Upper Trapezius Stretch  2 reps;20 seconds    Levator Stretch  2 reps;20 seconds    Chest Stretch  2 reps;20 seconds    Chest Stretch Limitations  doorway at 60 and 90 deg abduction             PT Education - 11/30/19 0922    Education Details  HEP    Person(s) Educated  Patient    Methods  Explanation;Demonstration;Verbal cues    Comprehension  Verbalized understanding;Returned demonstration;Verbal cues required;Need further instruction       PT Short Term Goals - 11/23/19 1023      PT SHORT TERM GOAL #1   Title  Patient will be I with initial HEP to progress in PT    Time  4    Period  Weeks    Status  On-going    Target Date  10/26/19      PT SHORT TERM GOAL #2  Title  Patient will report improved resting neck pain of </= 2-3 pain level to improve activity tolerance    Baseline  Previously achieved but having more pain now    Time  4    Period  Weeks    Status  On-going    Target Date  12/21/19      PT SHORT TERM GOAL #3   Title  Patient will be able to perform light household tasks such as cleaning with no difficulty    Time  4    Period  Weeks    Status  Achieved    Target Date  10/26/19      PT SHORT TERM GOAL #4   Title  Patient will exhibit improved ROM of right rotation to 65 deg to allow for improved driving ability without pain or limitation    Baseline  67    Time  4    Period  Weeks    Status  Achieved    Target Date  10/26/19        PT Long Term Goals - 11/23/19 1018      PT LONG TERM GOAL #1   Title  Patient will be independent in final HEP to maintain progress made in PT    Time  8    Period  Weeks    Status  On-going    Target Date  01/18/20      PT LONG TERM GOAL #2   Title  Patient will exhibit improved cervical motion to [facility] to allow for ability to perform all grooming and dressing tasks without pain or limitation    Baseline  Progressing toward goal, see flowsheet    Time  8    Period  Weeks     Status  On-going    Target Date  01/18/20      PT LONG TERM GOAL #3   Title  Patient will exhibit improved periscapular strength of >/= 4/5 MMT and DNF endurance >/= 20 sec to allow for improved postural control and lifting ability    Baseline  Continues to exhibit significant DNF endurance deficit    Time  8    Period  Weeks    Status  On-going    Target Date  01/18/20      PT LONG TERM GOAL #4   Title  Patient will be able to perform heavy household chores such as vaccuuming or lifting objects into overhead cabinets  with no difficulty    Time  8    Period  Weeks    Status  On-going    Target Date  01/18/20      PT LONG TERM GOAL #5   Title  Patient will report improved function of </= 46% limitation on FOTO    Baseline  She did exceed this goal on 11/01/2019 at 45% but is currently at 48% this visit    Time  8    Period  Weeks    Status  On-going    Target Date  01/18/20            Plan - 11/30/19 [id number]    Clinical Impression Statement  Patient with continued upper trap tightness that switches from side-to-side. She reported improvement in tightness following therapy. She does require consistent cueing to avoid shoulder shrug/overutilization of upper traps and for proper retraction technique. She would benefit from continued skilled PT to reduce upper trap tightness and neck pain so she can perform activities without limitation.  PT Treatment/Interventions  ADLs/Self Care Home Management;Cryotherapy;Electrical Stimulation;Moist Heat;Traction;Neuromuscular re-education;Therapeutic exercise;Therapeutic activities;Patient/family education;Manual techniques;Dry needling;Passive range of motion;Taping;Spinal Manipulations;Joint Manipulations    PT Next Visit Plan  continue manual, postural strengthening, DNF endurance, stretches, further dry needling as needed    PT Home Exercise Plan  Upper trap and levator stretching, doorway pec stretch, supine chin tuck, row with red, extension  with red, supine horiz abd with yellow, double ER with yellow    Consulted and Agree with Plan of Care  Patient       Patient will benefit from skilled therapeutic intervention in order to improve the following deficits and impairments:  Decreased range of motion, Pain, Increased muscle spasms, Decreased activity tolerance, Postural dysfunction, Decreased strength, Impaired flexibility  Visit Diagnosis: Cervicalgia  Muscle weakness (generalized)     Problem List Patient Active Problem List   Diagnosis Date Noted  . Cervical myofascial pain syndrome 09/16/2019  . Hypercalcemia 08/22/2019  . Chronic left shoulder pain 08/22/2019  . Hyperlipidemia 03/19/2019  . 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 Essential hypertension 01/12/2019  . Hyperthyroidism 01/12/2019  . Neuropathy 01/12/2019    Hilda Blades, PT, DPT, LAT, ATC 11/30/19  9:58 AM Phone: [phone] Fax: [facility] [address] [place], [place], [address] Phone: [phone]   Fax:  [phone]  Name: Pina Kelm MRN: [id number] Date of Birth: [birth year]

## 2019-12-06 ENCOUNTER — Other Ambulatory Visit: Payer: Self-pay

## 2019-12-10 ENCOUNTER — Encounter: Payer: Self-pay | Admitting: Internal Medicine

## 2019-12-10 ENCOUNTER — Other Ambulatory Visit: Payer: Self-pay

## 2019-12-10 ENCOUNTER — Ambulatory Visit: Payer: BLUE CROSS/BLUE SHIELD | Admitting: Internal Medicine

## 2019-12-10 VITALS — BP 164/88 | HR 70 | Temp 98.1°F | Ht 65.0 in | Wt 183.0 lb

## 2019-12-10 DIAGNOSIS — E0590 Thyrotoxicosis, unspecified without thyrotoxic crisis or storm: Secondary | ICD-10-CM | POA: Diagnosis not present

## 2019-12-10 DIAGNOSIS — E213 Hyperparathyroidism, unspecified: Secondary | ICD-10-CM

## 2019-12-10 LAB — TSH: TSH: 1.71 u[IU]/mL (ref 0.35–4.50)

## 2019-12-10 LAB — T4, FREE: Free T4: 0.85 ng/dL (ref 0.60–1.60)

## 2019-12-10 NOTE — Patient Instructions (Addendum)
-   Stay hydrated - Avoid over the counter calcium  - Consume 2-3 servings of dairy daily in your diet  - Continue PTU 50 mg daily   FOODS THAT CONTAIN CALCIUM  Calcium can be found in many foods, not only in dairy products.  Dairy Foods  Yogurt (1 cup) 350 mg  Milk (1 cup) 300 mg  Cheddar cheese (1 oz.) 204 mg  Ricotta cheese, part skim (1/4 cup) 169 mg  Cottage cheese (1 cup) 150 mg  Nondairy Foods  Whole Grain Total cereal (3/4 cup) 1000 mg  Pink salmon with bones, sardines (3 oz., cooked) 181 mg  Black beans (1 cup) 103 mg  Broccoli (1 cup, cooked) 150 mg  Almonds (1 tbsp.) 50 mg  Soy Products  Soy yogurt with calcium (3/4 cup) 300 mg  Soy milk enriched with calcium (1 cup) 300 mg  Tofu, firm or extra firm (1/4 cup) 250 mg  Soy nuts, roasted/salted (1/2 cup) 103 mg

## 2019-12-10 NOTE — Progress Notes (Signed)
Name: Elizabeth [name]  MRN/ DOB: [id number], 1960-12-16    Age/ Sex: 59 y.o., female     PCP: Crecencio Mc, MD   Reason for Endocrinology Evaluation: Hyperthyroidism/ Hypercalcemia      Initial Endocrinology Clinic Visit: 09/06/2019    PATIENT IDENTIFIER: Elizabeth [name] is a 59 y.o., female with a past medical history of HTN, Hyperthyroidism and Hypercalcemia . She has followed with [facility] since 09/06/2019 for consultative assistance with management of her Hypercalcemia and hyperthyroidism.   HISTORICAL SUMMARY: The patient was first diagnosed with hypercalcemia many years ago.  Ca/Cr ratio 0.013 in 11/2019 which is inconclusive.  DXA 09/25/2019 - normal results.    THYROID HISTORY :  Has been diagnosed with hyperthyroidism in early 2000's. She has been on PTU since her diagnosis.   SUBJECTIVE:   During last visit (09/06/2019): Continued PTU 50 mg daily.   Today (12/10/2019):  [name] is here for hyperthyroidism and hypercalcemia.   She denies recently kidney stones Denies polyuria and polydipsia   Denies diarrhea.  No recent fever   Continues to have hair loss.   Pt on Vitamin D3 1000 iu daily      ROS:  As per HPI.   HISTORY:  Past Medical History:  Past Medical History:  Diagnosis Date  . Gout   . Hypertension   . Thyroid disease    Past Surgical History:  Past Surgical History:  Procedure Laterality Date  . ENDOMETRIAL ABLATION  2010   Social History:  reports that she quit smoking about 20 years ago. She has never used smokeless tobacco. She reports previous alcohol use. She reports that she does not use drugs. Family History:  Family History  Problem Relation Age of Onset  . Hypertension Mother   . Heart disease Mother   . Heart failure Mother   . Hypertension Father      HOME MEDICATIONS: Allergies as of 12/10/2019      Reactions   Codeine    Gabapentin Other (See Comments)   Drowsiness and felt bad overall    Tramadol Other (See Comments)   Made her a zombie      Medication List       Accurate as of December 10, 2019  9:48 AM. If you have any questions, ask your nurse or doctor.        cholecalciferol 25 MCG (1000 UNIT) tablet Commonly known as: VITAMIN D3 Take 1,000 Units by mouth daily.   colchicine 0.6 MG tablet Take 2 tablets by mouth at onset of gout flare, then in 1 hour take 1 tablet by mouth.   diclofenac 75 MG EC tablet Commonly known as: VOLTAREN Take 1 tablet (75 mg total) by mouth 2 (two) times daily.   FLUTICASONE PROPIONATE NA Place into the nose.   furosemide 20 MG tablet Commonly known as: LASIX Take 1 tablet (20 mg total) by mouth daily.   losartan 100 MG tablet Commonly known as: COZAAR Take 1 tablet (100 mg total) by mouth daily.   methocarbamol 500 MG tablet Commonly known as: Robaxin Take 1 tablet (500 mg total) by mouth 4 (four) times daily.   MULTIVITAMIN ADULT PO Take by mouth.   propranolol 20 MG tablet Commonly known as: INDERAL TAKE 1 TABLET(20 MG) BY MOUTH THREE TIMES DAILY   propylthiouracil 50 MG tablet Commonly known as: PTU TAKE 1 TABLET(50 MG) BY MOUTH THREE TIMES DAILY         OBJECTIVE:   PHYSICAL  EXAM: VS: There were no vitals taken for this visit.   EXAM: General: Pt appears well and is in NAD  Hydration: Well-hydrated with moist mucous membranes and good skin turgor  Eyes: External eye exam normal without stare, lid lag or exophthalmos.  EOM intact.  PERRL.  Ears, Nose, Throat: Hearing: Grossly intact bilaterally Dental: Good dentition  Throat: Clear without mass, erythema or exudate  Neck: General: Supple without adenopathy. Thyroid: Thyroid size normal.  No goiter or nodules appreciated. No thyroid bruit.  Lungs: Clear with good BS bilat with no rales, rhonchi, or wheezes  Heart: Auscultation: RRR.  Abdomen: Normoactive bowel sounds, soft, nontender, without masses or organomegaly palpable  Extremities: Gait and  station: Normal gait  Digits and nails: No clubbing, cyanosis, petechiae, or nodes Head and neck: Normal alignment and mobility BL UE: Normal ROM and strength. BL LE: No pretibial edema normal ROM and strength.  Skin: Hair: Texture and amount normal with gender appropriate distribution Skin Inspection: No rashes, acanthosis nigricans/skin tags. No lipohypertrophy Skin Palpation: Skin temperature, texture, and thickness normal to palpation  Neuro: Cranial nerves: II - XII grossly intact  Cerebellar: Normal coordination and movement; no tremor Motor: Normal strength throughout DTRs: 2+ and symmetric in UE without delay in relaxation phase  Mental Status: Judgment, insight: Intact Orientation: Oriented to time, place, and person Memory: Intact for recent and remote events Mood and affect: No depression, anxiety, or agitation     DATA REVIEWED:  Results for RAILYN, BALLADARES (MRN [id number]) as of 12/10/2019 09:54  Ref. Range 11/09/2019 08:44  Sodium Latest Ref Range: 135 - 145 mEq/L 138  Potassium Latest Ref Range: 3.5 - 5.1 mEq/L 3.4 (L)  Chloride Latest Ref Range: 96 - 112 mEq/L 107  CO2 Latest Ref Range: 19 - 32 mEq/L 25  Glucose Latest Ref Range: 70 - 99 mg/dL 107 (H)  BUN Latest Ref Range: 6 - 23 mg/dL 15  Creatinine Latest Ref Range: 0.40 - 1.20 mg/dL 0.92  Calcium Latest Ref Range: 8.4 - 10.5 mg/dL 11.1 (H)  Albumin Latest Ref Range: 3.5 - 5.2 g/dL 4.0  GFR Latest Ref Range: >60.00 mL/min 75.45  VITD Latest Ref Range: 30.00 - 100.00 ng/mL 30.33  Results for JULLISSA, ROTHWEILER (MRN [id number]) as of 12/10/2019 09:54  Ref. Range 11/09/2019 08:44  PTH, Intact Latest Ref Range: 14 - 64 pg/mL 119 (H)  Results for AMYRA, DELAROSA (MRN [id number]) as of 12/10/2019 14:14  Ref. Range 12/10/2019 10:14  TSH Latest Ref Range: 0.35 - 4.50 uIU/mL 1.71  T4,Free(Direct) Latest Ref Range: 0.60 - 1.60 ng/dL 0.85    ASSESSMENT / PLAN / RECOMMENDATIONS:   Hyperthyroidism:   - Clinically she is euthyroid   - D/D graves' disease vs toxic thyroid nodule (s) - We discussed with pt the benefits of PTU in the Tx of hyperthyroidism, as well as the possible side effects/complications of anti-thyroid drug Tx (specifically detailing the rare, but serious side effect of agranulocytosis). We discussed the possible side effects of PTU including the chance of rash, the chance of liver irritation/juandice and the chance of sudden onset agranulocytosis.  - Pt would like to continue with PTU , as she is tolerating it well - Repeat TFT's continue to be normal.    Medications : PTU 50 mg daily    2. Hyperparathyroidism:  - Pt asymptomatic - 24- hr urine  Ca/Cr ratio 0.013 in 11/2019 which is inconclusive, but most likely Primary Hyperparathyroidism - We will continue to monitor for any evidence  of end-organ damage which there's none at this time.  - DXA normal 09/2019 - She was encouraged to stay hydrated, avoid OTC calcium tablets but to consume 2-3 servings of calcium daily.  - Continue Vitamin D3 1000 iu daily     F/U in 6 months     Signed electronically by: Mack Guise, MD  [facility]  [facility] [address] [place], [place] [address] Phone: [phone] FAX: [phone]      CC: Crecencio Mc, MD [place] [address] Phone: [phone]  Fax: [phone]   Return to Endocrinology clinic as below: Future Appointments  Date Time Provider Datil  12/10/2019  9:50 AM Phillip Sandler, Melanie Crazier, MD [facility] None  12/20/2019  9:30 AM Herma Carson, PT [facility]  12/27/2019  9:15 AM Bobette Mo, PT [facility]  01/03/2020  9:30 AM Herma Carson, PT [facility]  01/10/2020  9:15 AM Bobette Mo, PT [facility]

## 2019-12-20 ENCOUNTER — Other Ambulatory Visit: Payer: Self-pay

## 2019-12-20 ENCOUNTER — Ambulatory Visit: Payer: BLUE CROSS/BLUE SHIELD | Attending: Neurology | Admitting: Physical Therapy

## 2019-12-20 ENCOUNTER — Encounter: Payer: Self-pay | Admitting: Physical Therapy

## 2019-12-20 DIAGNOSIS — M542 Cervicalgia: Secondary | ICD-10-CM | POA: Diagnosis not present

## 2019-12-20 DIAGNOSIS — M6281 Muscle weakness (generalized): Secondary | ICD-10-CM | POA: Insufficient documentation

## 2019-12-20 NOTE — Therapy (Signed)
[place], [place], [address] Phone: [phone]   Fax:  [phone]  Physical Therapy Treatment  Patient Details  Name: Elizabeth [name] MRN: [id number] Date of Birth: November 04, 1960 Referring Provider (PT): Melvenia Beam, MD   Encounter Date: 12/20/2019  PT End of Session - 12/20/19 0946    Visit Number  8    Number of Visits  14    Date for PT Re-Evaluation  01/18/20    Authorization Type  BCBS    PT Start Time  [phone]   pt. arrived a few minutes late   PT Stop Time  1025   time spent for dry needling, estim and MHP not included in direct minutes   PT Time Calculation (min)  47 min    Activity Tolerance  Patient tolerated treatment well    Behavior During Therapy  [facility] for tasks assessed/performed       Past Medical History:  Diagnosis Date  . Gout   . Hypertension   . Thyroid disease     Past Surgical History:  Procedure Laterality Date  . ENDOMETRIAL ABLATION  2010    There were no vitals filed for this visit.  Subjective Assessment - 12/20/19 0941    Subjective  Pt. continues with upper trapezius region pain which "jumps around" between sides (notes bilaterally today) and notes pain extensing into back. She does report benefit from previous dry needling.    Currently in Pain?  Yes    Pain Score  6     Pain Location  Neck    Pain Orientation  Right;Left    Pain Descriptors / Indicators  Tightness                       [facility] Adult PT Treatment/Exercise - 12/20/19 0001      Neck Exercises: Theraband   Shoulder Extension  15 reps;Red   2 sets   Rows  15 reps;Green   2 sets-cues for elbow position   Shoulder External Rotation  15 reps   2 sets   Shoulder External Rotation Limitations  bilat with yellow    Horizontal ABduction  15 reps   2 sets   Horizontal ABduction Limitations  supine with yellow      Moist Heat Therapy   Number Minutes Moist Heat  10 Minutes     Moist Heat Location  Cervical      Neck Exercises: Stretches   Upper Trapezius Stretch  2 reps;20 seconds   bilat. supine manual stretch   Levator Stretch  2 reps;20 seconds   bilat., supine manual stretch   Chest Stretch  --       Trigger Point Dry Needling - 12/20/19 0001    Consent Given?  Yes    Muscles Treated Head and Neck  Upper trapezius;Levator scapulae    Dry Needling Comments  bilat. sides in prone with 32 gauge 30 mm needles x 6 total    Electrical Stimulation Performed with Dry Needling  Yes    E-stim with Dry Needling Details  TENS 20 pps x 10 minutes           PT Education - 12/20/19 1005    Education Details  neck muscle anatomy    Person(s) Educated  Patient    Methods  Explanation;Verbal cues    Comprehension  Verbalized understanding       PT Short Term Goals - 11/23/19 1023  PT SHORT TERM GOAL #1   Title  Patient will be I with initial HEP to progress in PT    Time  4    Period  Weeks    Status  On-going    Target Date  10/26/19      PT SHORT TERM GOAL #2   Title  Patient will report improved resting neck pain of </= 2-3 pain level to improve activity tolerance    Baseline  Previously achieved but having more pain now    Time  4    Period  Weeks    Status  On-going    Target Date  12/21/19      PT SHORT TERM GOAL #3   Title  Patient will be able to perform light household tasks such as cleaning with no difficulty    Time  4    Period  Weeks    Status  Achieved    Target Date  10/26/19      PT SHORT TERM GOAL #4   Title  Patient will exhibit improved ROM of right rotation to 65 deg to allow for improved driving ability without pain or limitation    Baseline  67    Time  4    Period  Weeks    Status  Achieved    Target Date  10/26/19        PT Long Term Goals - 11/23/19 1018      PT LONG TERM GOAL #1   Title  Patient will be independent in final HEP to maintain progress made in PT    Time  8    Period  Weeks    Status   On-going    Target Date  01/18/20      PT LONG TERM GOAL #2   Title  Patient will exhibit improved cervical motion to [facility] to allow for ability to perform all grooming and dressing tasks without pain or limitation    Baseline  Progressing toward goal, see flowsheet    Time  8    Period  Weeks    Status  On-going    Target Date  01/18/20      PT LONG TERM GOAL #3   Title  Patient will exhibit improved periscapular strength of >/= 4/5 MMT and DNF endurance >/= 20 sec to allow for improved postural control and lifting ability    Baseline  Continues to exhibit significant DNF endurance deficit    Time  8    Period  Weeks    Status  On-going    Target Date  01/18/20      PT LONG TERM GOAL #4   Title  Patient will be able to perform heavy household chores such as vaccuuming or lifting objects into overhead cabinets  with no difficulty    Time  8    Period  Weeks    Status  On-going    Target Date  01/18/20      PT LONG TERM GOAL #5   Title  Patient will report improved function of </= 46% limitation on FOTO    Baseline  She did exceed this goal on 11/01/2019 at 45% but is currently at 48% this visit    Time  8    Period  Weeks    Status  On-going    Target Date  01/18/20            Plan - 12/20/19 0947    Clinical Impression Statement  Pt. responds well to tx. for reduced symptoms but fair progress given tendency symptom exacerbation between visits. Given symptom duration expect more consistent/lasting symptom relief will be more gradual with continued tx. Pt. did report some issues recently with spasms in anterolateral region of neck under jaw-checked SCM and scalenes but unable to locate any regions of local soreness so uncertain etiology.    Personal Factors and Comorbidities  Past/Current Experience;Time since onset of injury/illness/exacerbation    Examination-Activity Limitations  Carry;Lift;Sleep    Examination-Participation Restrictions  Cleaning;Meal Prep;Community  Activity;Laundry;Yard Work    [employer]  Evolving/Moderate complexity    Clinical Decision Making  Moderate    Rehab Potential  Good    PT Frequency  1x / week    PT Duration  8 weeks    PT Treatment/Interventions  ADLs/Self Care Home Management;Cryotherapy;Electrical Stimulation;Moist Heat;Traction;Neuromuscular re-education;Therapeutic exercise;Therapeutic activities;Patient/family education;Manual techniques;Dry needling;Passive range of motion;Taping;Spinal Manipulations;Joint Manipulations    PT Next Visit Plan  continue manual, postural strengthening, DNF endurance, stretches, further dry needling as needed    PT Home Exercise Plan  Upper trap and levator stretching, doorway pec stretch, supine chin tuck, row with red, extension with red, supine horiz abd with yellow, double ER with yellow    Consulted and Agree with Plan of Care  Patient       Patient will benefit from skilled therapeutic intervention in order to improve the following deficits and impairments:  Decreased range of motion, Pain, Increased muscle spasms, Decreased activity tolerance, Postural dysfunction, Decreased strength, Impaired flexibility  Visit Diagnosis: Cervicalgia  Muscle weakness (generalized)     Problem List Patient Active Problem List   Diagnosis Date Noted  . Hyperparathyroidism ([place]) 12/10/2019  . Cervical myofascial pain syndrome 09/16/2019  . Hypercalcemia 08/22/2019  . Chronic left shoulder pain 08/22/2019  . Hyperlipidemia 03/19/2019  . 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 Essential hypertension 01/12/2019  . Hyperthyroidism 01/12/2019  . Neuropathy 01/12/2019    Beaulah Dinning, PT, DPT 12/20/19 10:06 AM  [facility] [address] [place], [place], [address] Phone: [phone]   Fax:  [phone]  Name: Elizabeth [name] MRN: [id number] Date of Birth: [birth year]

## 2019-12-27 ENCOUNTER — Other Ambulatory Visit: Payer: Self-pay

## 2019-12-27 ENCOUNTER — Encounter: Payer: Self-pay | Admitting: Physical Therapy

## 2019-12-27 ENCOUNTER — Ambulatory Visit: Payer: BLUE CROSS/BLUE SHIELD | Admitting: Physical Therapy

## 2019-12-27 DIAGNOSIS — M542 Cervicalgia: Secondary | ICD-10-CM

## 2019-12-27 DIAGNOSIS — M6281 Muscle weakness (generalized): Secondary | ICD-10-CM

## 2019-12-27 NOTE — Therapy (Signed)
[place], [place], [address] Phone: [phone]   Fax:  [phone]  Physical Therapy Treatment  Patient Details  Name: Elizabeth [name] MRN: [id number] Date of Birth: 10-Apr-1961 Referring Provider (PT): Melvenia Beam, MD   Encounter Date: 12/27/2019  PT End of Session - 12/27/19 0919    Visit Number  9    Number of Visits  14    Date for PT Re-Evaluation  01/18/20    Authorization Type  BCBS    PT Start Time  0918    PT Stop Time  0959    PT Time Calculation (min)  41 min    Activity Tolerance  Patient tolerated treatment well    Behavior During Therapy  [facility] for tasks assessed/performed       Past Medical History:  Diagnosis Date  . Gout   . Hypertension   . Thyroid disease     Past Surgical History:  Procedure Laterality Date  . ENDOMETRIAL ABLATION  2010    There were no vitals filed for this visit.  Subjective Assessment - 12/27/19 0922    Subjective  Patient reports she is feeling ok today. She states the upper trap pain has been on and off since last visit, she reports it is occurring now when she turns her head and it is both sides.    Patient Stated Goals  Get rid of neck pain and stop episodes of weakness    Currently in Pain?  Yes    Pain Score  6     Pain Location  Neck    Pain Orientation  Right;Left    Pain Descriptors / Indicators  Tightness    Pain Type  Chronic pain    Pain Radiating Towards  bilateral upper trap region    Pain Onset  More than a month ago    Pain Frequency  Intermittent    Aggravating Factors   turning head both directions         [facility] PT Assessment - 12/27/19 0001      Assessment   Medical Diagnosis  Cervical myofascial pain syndrome    Referring Provider (PT)  Melvenia Beam, MD      Precautions   Precautions  None      Restrictions   Weight Bearing Restrictions  No      Balance Screen   Has the patient fallen in the past 6 months   No      Observation/Other Assessments   Focus on Therapeutic Outcomes (FOTO)   47% limitation      AROM   Cervical - Right Side Bend  45   contralateral upper trap tightness   Cervical - Left Side Bend  45   contralateral upper trap tightness                  [facility] Adult PT Treatment/Exercise - 12/27/19 0001      Exercises   Exercises  Neck      Neck Exercises: Machines for Strengthening   UBE (Upper Arm Bike)  L2 x 5 min (fwd/bwd)      Neck Exercises: Theraband   Shoulder Extension  10 reps;Green   2 sets   Shoulder Extension Limitations  shortened movement, consistent cueing to keep shoulders down    Rows  10 reps;Green   2 sets   Rows Limitations  consistent cueing for slow/controlled movement and keeping shoulders down    Shoulder External Rotation  10 reps;Red   2 sets   Shoulder External Rotation Limitations  cued for scap retraction      Neck Exercises: Seated   Neck Retraction  10 reps;3 secs   2 sets   Neck Retraction Limitations  tactile and verbal cueing to perform correctly      Neck Exercises: Stretches   Upper Trapezius Stretch  2 reps;20 seconds    Levator Stretch  2 reps;20 seconds    Chest Stretch  2 reps;20 seconds    Chest Stretch Limitations  doorway at 90 deg abduction             PT Education - 12/27/19 0918    Education Details  HEP    Person(s) Educated  Patient    Methods  Explanation;Demonstration;Tactile cues;Verbal cues;Handout    Comprehension  Verbalized understanding;Returned demonstration;Verbal cues required;Tactile cues required;Need further instruction       PT Short Term Goals - 11/23/19 1023      PT SHORT TERM GOAL #1   Title  Patient will be I with initial HEP to progress in PT    Time  4    Period  Weeks    Status  On-going    Target Date  10/26/19      PT SHORT TERM GOAL #2   Title  Patient will report improved resting neck pain of </= 2-3 pain level to improve activity tolerance    Baseline   Previously achieved but having more pain now    Time  4    Period  Weeks    Status  On-going    Target Date  12/21/19      PT SHORT TERM GOAL #3   Title  Patient will be able to perform light household tasks such as cleaning with no difficulty    Time  4    Period  Weeks    Status  Achieved    Target Date  10/26/19      PT SHORT TERM GOAL #4   Title  Patient will exhibit improved ROM of right rotation to 65 deg to allow for improved driving ability without pain or limitation    Baseline  67    Time  4    Period  Weeks    Status  Achieved    Target Date  10/26/19        PT Long Term Goals - 12/27/19 0930      PT LONG TERM GOAL #1   Title  Patient will be independent in final HEP to maintain progress made in PT    Time  8    Period  Weeks    Status  On-going    Target Date  01/18/20      PT LONG TERM GOAL #2   Title  Patient will exhibit improved cervical motion to [facility] to allow for ability to perform all grooming and dressing tasks without pain or limitation    Baseline  Progressing toward goal, see flowsheet    Time  8    Period  Weeks    Status  On-going    Target Date  01/18/20      PT LONG TERM GOAL #3   Title  Patient will exhibit improved periscapular strength of >/= 4/5 MMT and DNF endurance >/= 20 sec to allow for improved postural control and lifting ability    Baseline  Continues to exhibit significant DNF endurance deficit    Time  8    Period  Weeks    Status  On-going    Target Date  01/18/20      PT LONG TERM GOAL #4   Title  Patient will be able to perform heavy household chores such as vaccuuming or lifting objects into overhead cabinets  with no difficulty    Time  8    Period  Weeks    Status  On-going    Target Date  01/18/20      PT LONG TERM GOAL #5   Title  Patient will report improved function of </= 46% limitation on FOTO    Baseline  She did exceed this goal on 11/01/2019 at 45% but is currently at 47% limitation this visit    Time  8     Period  Weeks    Status  On-going    Target Date  01/18/20            Plan - 12/27/19 0919    Clinical Impression Statement  Patient tolerated therapy well with no advers effects. She continues to exhibit difficulty with scapular control to avoid shrug with exercises and with coordinating proper chin tuck exercise. She seems to be slightly better in regard to range of motion and neck tightness this visit. She would benefit from continued skilled PT to reduce upper trap tightness and neck pain so she can perform activities without limitation.    PT Treatment/Interventions  ADLs/Self Care Home Management;Cryotherapy;Electrical Stimulation;Moist Heat;Traction;Neuromuscular re-education;Therapeutic exercise;Therapeutic activities;Patient/family education;Manual techniques;Dry needling;Passive range of motion;Taping;Spinal Manipulations;Joint Manipulations    PT Next Visit Plan  continue manual, postural strengthening, DNF endurance, stretches, further dry needling as needed    PT Home Exercise Plan  Upper trap and levator stretching, doorway pec stretch, supine chin tuck, row with red, extension with red, supine horiz abd with yellow, double ER with yellow    Consulted and Agree with Plan of Care  Patient       Patient will benefit from skilled therapeutic intervention in order to improve the following deficits and impairments:  Decreased range of motion, Pain, Increased muscle spasms, Decreased activity tolerance, Postural dysfunction, Decreased strength, Impaired flexibility  Visit Diagnosis: Cervicalgia  Muscle weakness (generalized)     Problem List Patient Active Problem List   Diagnosis Date Noted  . Hyperparathyroidism ([place]) 12/10/2019  . Cervical myofascial pain syndrome 09/16/2019  . Hypercalcemia 08/22/2019  . Chronic left shoulder pain 08/22/2019  . Hyperlipidemia 03/19/2019  . 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 Essential hypertension 01/12/2019  .  Hyperthyroidism 01/12/2019  . Neuropathy 01/12/2019    Hilda Blades, PT, DPT, LAT, ATC 12/27/19  10:00 AM Phone: [phone] Fax: [facility] [address] [place], [place], [address] Phone: [phone]   Fax:  [phone]  Name: Elizabeth [name] MRN: [id number] Date of Birth: [birth year]

## 2020-01-02 ENCOUNTER — Ambulatory Visit: Payer: BLUE CROSS/BLUE SHIELD | Admitting: Internal Medicine

## 2020-01-02 ENCOUNTER — Other Ambulatory Visit: Payer: Self-pay

## 2020-01-02 ENCOUNTER — Encounter: Payer: Self-pay | Admitting: Internal Medicine

## 2020-01-02 VITALS — BP 152/90 | HR 57 | Temp 97.4°F | Ht 65.0 in | Wt 185.6 lb

## 2020-01-02 DIAGNOSIS — E210 Primary hyperparathyroidism: Secondary | ICD-10-CM

## 2020-01-02 DIAGNOSIS — J309 Allergic rhinitis, unspecified: Secondary | ICD-10-CM | POA: Diagnosis not present

## 2020-01-02 DIAGNOSIS — I10 Essential (primary) hypertension: Secondary | ICD-10-CM

## 2020-01-02 DIAGNOSIS — H6983 Other specified disorders of Eustachian tube, bilateral: Secondary | ICD-10-CM | POA: Diagnosis not present

## 2020-01-02 DIAGNOSIS — H6993 Unspecified Eustachian tube disorder, bilateral: Secondary | ICD-10-CM

## 2020-01-02 DIAGNOSIS — R928 Other abnormal and inconclusive findings on diagnostic imaging of breast: Secondary | ICD-10-CM | POA: Diagnosis not present

## 2020-01-02 MED ORDER — SALINE SPRAY 0.65 % NA SOLN: 2.0000 | Freq: Every day | NASAL | 11 refills | Status: DC | PRN

## 2020-01-02 MED ORDER — CETIRIZINE HCL 10 MG PO TABS: 10.0000 mg | ORAL_TABLET | Freq: Every evening | ORAL | 3 refills | Status: DC | PRN

## 2020-01-02 NOTE — Patient Instructions (Addendum)
For your husband Vitamin D 4000 IU daily  Vitamin C 1000 mg daily  Zinc 100 mg daily  Quercetin 250-500 mg daily  mucinex dm green label  Warm tea honey and lemon Increase water     You can take sudafed x 3 days with nasal saline, flonase and zyrtec   Eustachian Tube Dysfunction  Eustachian tube dysfunction refers to a condition in which a blockage develops in the narrow passage that connects the middle ear to the back of the nose (eustachian tube). The eustachian tube regulates air pressure in the middle ear by letting air move between the ear and nose. It also helps to drain fluid from the middle ear space. Eustachian tube dysfunction can affect one or both ears. When the eustachian tube does not function properly, air pressure, fluid, or both can build up in the middle ear. What are the causes? This condition occurs when the eustachian tube becomes blocked or cannot open normally. Common causes of this condition include:  Ear infections.  Colds and other infections that affect the nose, mouth, and throat (upper respiratory tract).  Allergies.  Irritation from cigarette smoke.  Irritation from stomach acid coming up into the esophagus (gastroesophageal reflux). The esophagus is the tube that carries food from the mouth to the stomach.  Sudden changes in air pressure, such as from descending in an airplane or scuba diving.  Abnormal growths in the nose or throat, such as: ? Growths that line the nose (nasal polyps). ? Abnormal growth of cells (tumors). ? Enlarged tissue at the back of the throat (adenoids). What increases the risk? You are more likely to develop this condition if:  You smoke.  You are overweight.  You are a child who has: ? Certain birth defects of the mouth, such as cleft palate. ? Large tonsils or adenoids. What are the signs or symptoms? Common symptoms of this condition include:  A feeling of fullness in the ear.  Ear pain.  Clicking or  popping noises in the ear.  Ringing in the ear.  Hearing loss.  Loss of balance.  Dizziness. Symptoms may get worse when the air pressure around you changes, such as when you travel to an area of high elevation, fly on an airplane, or go scuba diving. How is this diagnosed? This condition may be diagnosed based on:  Your symptoms.  A physical exam of your ears, nose, and throat.  Tests, such as those that measure: ? The movement of your eardrum (tympanogram). ? Your hearing (audiometry). How is this treated? Treatment depends on the cause and severity of your condition.  In mild cases, you may relieve your symptoms by moving air into your ears. This is called "popping the ears."  In more severe cases, or if you have symptoms of fluid in your ears, treatment may include: ? Medicines to relieve congestion (decongestants). ? Medicines that treat allergies (antihistamines). ? Nasal sprays or ear drops that contain medicines that reduce swelling (steroids). ? A procedure to drain the fluid in your eardrum (myringotomy). In this procedure, a small tube is placed in the eardrum to:  Drain the fluid.  Restore the air in the middle ear space. ? A procedure to insert a balloon device through the nose to inflate the opening of the eustachian tube (balloon dilation). Follow these instructions at home: Lifestyle  Do not do any of the following until your health care provider approves: ? Travel to high altitudes. ? Fly in airplanes. ? Work in  a pressurized cabin or room. ? Scuba dive.  Do not use any products that contain nicotine or tobacco, such as cigarettes and e-cigarettes. If you need help quitting, ask your health care provider.  Keep your ears dry. Wear fitted earplugs during showering and bathing. Dry your ears completely after. General instructions  Take over-the-counter and prescription medicines only as told by your health care provider.  Use techniques to help pop  your ears as recommended by your health care provider. These may include: ? Chewing gum. ? Yawning. ? Frequent, forceful swallowing. ? Closing your mouth, holding your nose closed, and gently blowing as if you are trying to blow air out of your nose.  Keep all follow-up visits as told by your health care provider. This is important. Contact a health care provider if:  Your symptoms do not go away after treatment.  Your symptoms come back after treatment.  You are unable to pop your ears.  You have: ? A fever. ? Pain in your ear. ? Pain in your head or neck. ? Fluid draining from your ear.  Your hearing suddenly changes.  You become very dizzy.  You lose your balance. Summary  Eustachian tube dysfunction refers to a condition in which a blockage develops in the eustachian tube.  It can be caused by ear infections, allergies, inhaled irritants, or abnormal growths in the nose or throat.  Symptoms include ear pain, hearing loss, or ringing in the ears.  Mild cases are treated with maneuvers to unblock the ears, such as yawning or ear popping.  Severe cases are treated with medicines. Surgery may also be done (rare). This information is not intended to replace advice given to you by your health care provider. Make sure you discuss any questions you have with your health care provider. Document Revised: 12/13/2017 Document Reviewed: 12/13/2017 Elsevier Patient Education  [place].

## 2020-01-02 NOTE — Progress Notes (Signed)
Chief Complaint  Patient presents with  . Ear Problem    left ear fullness and loss of hearing. Onset of 2 days ago. No known injuries, pt uses q-tips to clean outside of ears but states she does not put them inside her ear. History of vertigo, otherwise no known ear issues.    Acute visit  1. BP elevated takes losartan 100 mg qhs and lasix 20 mg qd in am unable to take BB due to HR, CCB and hctz due to elevated calcium  2. Calcium elevated 11.1 last checked has seen Dr. Gabriel Carina in past and appt sch Dr. Kelton Pillar in 06/2020  W/u so far primary hyperparathyroidism suspected  3. Reason for visit x 2 days reduced hearing b/l ears L>R w/o pain and ear fullness. Not tried any allergy pills but has tried flonase w/o relief. Sounds sound mumbled on the phone and had to use loud speaker. She does have h/o vertigo. She is dizzy at times but not recently  4. H/o right abnormal mammo overdue for f/u   Review of Systems  Constitutional: Negative for weight loss.  HENT: Positive for hearing loss. Negative for ear pain.   Respiratory: Negative for sputum production.   Cardiovascular: Negative for chest pain.  Musculoskeletal: Negative for falls.  Skin: Negative for rash.  Neurological: Negative for dizziness.  Psychiatric/Behavioral: Negative for depression.   Past Medical History:  Diagnosis Date  . Gout   . Hypertension   . Thyroid disease   . Vertigo    Past Surgical History:  Procedure Laterality Date  . ENDOMETRIAL ABLATION  2010   Family History  Problem Relation Age of Onset  . Hypertension Mother   . Heart disease Mother   . Heart failure Mother   . Hypertension Father    Social History   Socioeconomic History  . Marital status: Married    Spouse name: Not on file  . Number of children: Not on file  . Years of education: Not on file  . Highest education level: Not on file  Occupational History  . Not on file  Tobacco Use  . Smoking status: Former Smoker    Quit date:  01/12/1999    Years since quitting: 20.9  . Smokeless tobacco: Never Used  Substance and Sexual Activity  . Alcohol use: Not Currently    Comment: ocassionally (last over the holidays 2020)  . Drug use: Never  . Sexual activity: Not on file  Other Topics Concern  . Not on file  Social History Narrative   Lives with husband Herbie Baltimore (also a pt Writer)   Right handed   Social Determinants of Health   Financial Resource Strain:   . Difficulty of Paying Living Expenses:   Food Insecurity:   . Worried About Charity fundraiser in the Last Year:   . Arboriculturist in the Last Year:   Transportation Needs:   . Film/video editor (Medical):   Marland Kitchen Lack of Transportation (Non-Medical):   Physical Activity:   . Days of Exercise per Week:   . Minutes of Exercise per Session:   Stress:   . Feeling of Stress :   Social Connections:   . Frequency of Communication with Friends and Family:   . Frequency of Social Gatherings with Friends and Family:   . Attends Religious Services:   . Active Member of Clubs or Organizations:   . Attends Archivist Meetings:   Marland Kitchen Marital Status:  Intimate Partner Violence:   . Fear of Current or Ex-Partner:   . Emotionally Abused:   Marland Kitchen Physically Abused:   . Sexually Abused:    Current Meds  Medication Sig  . cholecalciferol (VITAMIN D3) 25 MCG (1000 UT) tablet Take 1,000 Units by mouth daily.  Marland Kitchen FLUTICASONE PROPIONATE NA Place into the nose.  . furosemide (LASIX) 20 MG tablet Take 1 tablet (20 mg total) by mouth daily.  Marland Kitchen gabapentin (NEURONTIN) 300 MG capsule Take 300 mg by mouth daily as needed.  Marland Kitchen losartan (COZAAR) 100 MG tablet Take 1 tablet (100 mg total) by mouth daily.  . methocarbamol (ROBAXIN) 500 MG tablet Take 1 tablet (500 mg total) by mouth 4 (four) times daily.  . propranolol (INDERAL) 20 MG tablet TAKE 1 TABLET(20 MG) BY MOUTH THREE TIMES DAILY  . propylthiouracil (PTU) 50 MG tablet TAKE 1 TABLET(50 MG) BY  MOUTH THREE TIMES DAILY   Allergies  Allergen Reactions  . Codeine   . Gabapentin Other (See Comments)    Drowsiness and felt bad overall  . Tramadol Other (See Comments)    Made her a zombie    Recent Results (from the past 2160 hour(s))  PTH, intact (no Ca)     Status: Abnormal   Collection Time: 11/09/19  8:44 AM  Result Value Ref Range   PTH 119 (H) 14 - 64 pg/mL    Comment: . Interpretive Guide    Intact PTH           Calcium ------------------    ----------           ------- Normal Parathyroid    Normal               Normal Hypoparathyroidism    Low or Low Normal    Low Hyperparathyroidism    Primary            Normal or High       High    Secondary          High                 Normal or Low    Tertiary           High                 High Non-Parathyroid    Hypercalcemia      Low or Low Normal    High .   Albumin     Status: None   Collection Time: 11/09/19  8:44 AM  Result Value Ref Range   Albumin 4.0 3.5 - 5.2 g/dL  Vitamin D 25     Status: None   Collection Time: 11/09/19  8:44 AM  Result Value Ref Range   VITD 30.33 30.00 - 100.00 ng/mL  BMP     Status: Abnormal   Collection Time: 11/09/19  8:44 AM  Result Value Ref Range   Sodium 138 135 - 145 mEq/L   Potassium 3.4 (L) 3.5 - 5.1 mEq/L   Chloride 107 96 - 112 mEq/L   CO2 25 19 - 32 mEq/L   Glucose, Bld 107 (H) 70 - 99 mg/dL   BUN 15 6 - 23 mg/dL   Creatinine, Ser 0.92 0.40 - 1.20 mg/dL   GFR 75.45 >60.00 mL/min   Calcium 11.1 (H) 8.4 - 10.5 mg/dL  Creatinine, urine, 24 hour     Status: None   Collection Time: 11/09/19  8:44 AM  Result Value Ref  Range   Creatinine, 24H Ur 0.99 0.50 - 2.15 g/24 h  Calcium, urine, 24 hour     Status: None   Collection Time: 11/09/19  8:45 AM  Result Value Ref Range   Calcium, 24H Urine 160 mg/24 h    Comment:                           Reference Range  35-250                            Low calcium diet 35-200   TSH     Status: None   Collection Time: 12/10/19 10:14  AM  Result Value Ref Range   TSH 1.71 0.35 - 4.50 uIU/mL  T4, free     Status: None   Collection Time: 12/10/19 10:14 AM  Result Value Ref Range   Free T4 0.85 0.60 - 1.60 ng/dL    Comment: Specimens from patients who are undergoing biotin therapy and /or ingesting biotin supplements may contain high levels of biotin.  The higher biotin concentration in these specimens interferes with this Free T4 assay.  Specimens that contain high levels  of biotin may cause false high results for this Free T4 assay.  Please interpret results in light of the total clinical presentation of the patient.    PTH-Related Peptide     Status: None (Preliminary result)   Collection Time: 01/02/20 10:27 AM  Result Value Ref Range   PTH-related peptide WILL FOLLOW   Cortisol     Status: None   Collection Time: 01/02/20 10:27 AM  Result Value Ref Range   Cortisol 6.7 ug/dL    Comment:                         Cortisol AM         6.2 - 19.4                         Cortisol PM         2.3 - 11.9   Vitamin D 1,25 dihydroxy     Status: None (Preliminary result)   Collection Time: 01/02/20 10:27 AM  Result Value Ref Range   Vitamin D 1, 25 (OH)2 Total WILL FOLLOW    Vitamin D2 1, 25 (OH)2 WILL FOLLOW    Vitamin D3 1, 25 (OH)2 WILL FOLLOW    Objective  Body mass index is 30.89 kg/m. Wt Readings from Last 3 Encounters:  01/02/20 185 lb 9.6 oz (84.2 kg)  12/10/19 183 lb (83 kg)  09/13/19 180 lb (81.6 kg)   Temp Readings from Last 3 Encounters:  01/02/20 (!) 97.4 F (36.3 C) (Temporal)  12/10/19 98.1 F (36.7 C)  09/13/19 (!) 96.8 F (36 C)   BP Readings from Last 3 Encounters:  01/02/20 (!) 152/90  12/10/19 (!) 164/88  09/13/19 (!) 152/78   Pulse Readings from Last 3 Encounters:  01/02/20 (!) 57  12/10/19 70  09/13/19 63    Physical Exam Vitals and nursing note reviewed.  Constitutional:      Appearance: Normal appearance. She is well-developed. She is obese.  HENT:     Head: Normocephalic  and atraumatic.     Ears:     Comments: Fluid in b/l ears  Eyes:     Conjunctiva/sclera: Conjunctivae normal.     Pupils: Pupils  are equal, round, and reactive to light.  Cardiovascular:     Rate and Rhythm: Normal rate and regular rhythm.     Heart sounds: Normal heart sounds. No murmur.  Pulmonary:     Effort: Pulmonary effort is normal.     Breath sounds: Normal breath sounds.  Skin:    General: Skin is warm and dry.  Neurological:     General: No focal deficit present.     Mental Status: She is alert and oriented to person, place, and time. Mental status is at baseline.     Gait: Gait normal.  Psychiatric:        Attention and Perception: Attention and perception normal.        Mood and Affect: Mood and affect normal.        Speech: Speech normal.        Behavior: Behavior normal. Behavior is cooperative.        Thought Content: Thought content normal.        Cognition and Memory: Cognition and memory normal.        Judgment: Judgment normal.     Assessment  Plan  Dysfunction of both eustachian tubes Add ns and zyrtec to flonase  If issues persists consider ent  Allergic rhinitis, unspecified seasonality, unspecified trigger - Plan: cetirizine (ZYRTEC) 10 MG tablet, sodium chloride (OCEAN) 0.65 % SOLN nasal spray  Hypercalcemia suspected primary hyperparathyroidism- Plan: Calcium, ionized, PTH-Related Peptide, Cortisol, Vitamin D 1,25 dihydroxy Pt has seen Dr. Gabriel Carina in the past careeverywhere w/u  appt Dr. Kelton Pillar 06/2020 ? If needs sestamibi parathyroid scan?  Cortisol today 6.7 normal pending 1,25 vit D and pth rp and ionized Calcium   Abnormal mammogram - Plan: MM DIAG BREAST TOMO UNI RIGHT, US BREAST LTD UNI RIGHT INC AXILLA  Essential hypertension Take losartan 100 mg in am with lasix 20 mg qd Limited on opts BB due to brady and CCB, hctz cant use for now due to high Calcim  F/u with PCP 6-8 weeks of note husband + covid 01/01/20 per pt has not been around  him and been out of town   Provider: Dr. Olivia Mackie McLean-Scocuzza-[facility]

## 2020-01-03 ENCOUNTER — Ambulatory Visit: Payer: BLUE CROSS/BLUE SHIELD | Admitting: Physical Therapy

## 2020-01-03 ENCOUNTER — Encounter: Payer: Self-pay | Admitting: Internal Medicine

## 2020-01-03 ENCOUNTER — Telehealth: Payer: Self-pay | Admitting: Internal Medicine

## 2020-01-03 ENCOUNTER — Encounter: Payer: Self-pay | Admitting: Physical Therapy

## 2020-01-03 DIAGNOSIS — M542 Cervicalgia: Secondary | ICD-10-CM

## 2020-01-03 DIAGNOSIS — H6993 Unspecified Eustachian tube disorder, bilateral: Secondary | ICD-10-CM | POA: Insufficient documentation

## 2020-01-03 DIAGNOSIS — M6281 Muscle weakness (generalized): Secondary | ICD-10-CM | POA: Diagnosis not present

## 2020-01-03 DIAGNOSIS — H6983 Other specified disorders of Eustachian tube, bilateral: Secondary | ICD-10-CM | POA: Insufficient documentation

## 2020-01-03 DIAGNOSIS — J309 Allergic rhinitis, unspecified: Secondary | ICD-10-CM | POA: Insufficient documentation

## 2020-01-03 LAB — CALCIUM, IONIZED: Calcium, Ion: 6.1 mg/dL — ABNORMAL HIGH (ref 4.5–5.6)

## 2020-01-03 NOTE — Telephone Encounter (Signed)
I left pt vm to call ofc regarding mammo referral.

## 2020-01-03 NOTE — Therapy (Signed)
[facility] [place], [place], [address] Phone: [phone]   Fax:  [phone]  Physical Therapy Treatment  Patient Details  Name: Elizabeth [name] MRN: [id number] Date of Birth: August 26, 1961 Referring Provider (PT): Melvenia Beam, MD   Encounter Date: 01/03/2020  PT End of Session - 01/03/20 1118    Visit Number  10    Number of Visits  14    Date for PT Re-Evaluation  01/18/20    Authorization Type  [employer]    PT Start Time  [phone]   pt. arrived late   PT Stop Time  1016    PT Time Calculation (min)  32 min    Activity Tolerance  Patient tolerated treatment well    Behavior During Therapy  [facility] for tasks assessed/performed       Past Medical History:  Diagnosis Date  . Gout   . Hypertension   . Thyroid disease     Past Surgical History:  Procedure Laterality Date  . ENDOMETRIAL ABLATION  2010    There were no vitals filed for this visit.  Subjective Assessment - 01/03/20 1013    Subjective  Pt. reports pain on both sides today (in upper trapezius/levator region) when turning head side to side. Pain 6/10 this AM.    Pain Score  6     Pain Location  Neck    Pain Orientation  Right;Left    Pain Descriptors / Indicators  Tightness    Pain Type  Chronic pain    Pain Radiating Towards  bilateral upper trapezius/levator region    Aggravating Factors   cervical rotation motion                       [facility] Adult PT Treatment/Exercise - 01/03/20 0001      Neck Exercises: Supine   Neck Retraction  15 reps      Manual Therapy   Soft tissue mobilization  STM in sitting bilateral upper trapezius and levator region      Neck Exercises: Stretches   Upper Trapezius Stretch  Right;Left;2 reps;30 seconds    Levator Stretch  Right;Left;2 reps;30 seconds       Trigger Point Dry Needling - 01/03/20 0001    Consent Given?  Yes    Muscles Treated Head and Neck  Upper trapezius;Levator scapulae    Dry Needling Comments  needling to bilateral sides in prone with 32 gauge 30 mm needles    Electrical Stimulation Performed with Dry Needling  Yes    E-stim with Dry Needling Details  TENS 20 pps x 10 minutes           PT Education - 01/03/20 1117    Education Details  Trigger point etiology    Person(s) Educated  Patient    Methods  Explanation    Comprehension  Verbalized understanding       PT Short Term Goals - 11/23/19 1023      PT SHORT TERM GOAL #1   Title  Patient will be I with initial HEP to progress in PT    Time  4    Period  Weeks    Status  On-going    Target Date  10/26/19      PT SHORT TERM GOAL #2   Title  Patient will report improved resting neck pain of </= 2-3 pain level to improve activity tolerance    Baseline  Previously achieved but having more pain  now    Time  4    Period  Weeks    Status  On-going    Target Date  12/21/19      PT SHORT TERM GOAL #3   Title  Patient will be able to perform light household tasks such as cleaning with no difficulty    Time  4    Period  Weeks    Status  Achieved    Target Date  10/26/19      PT SHORT TERM GOAL #4   Title  Patient will exhibit improved ROM of right rotation to 65 deg to allow for improved driving ability without pain or limitation    Baseline  67    Time  4    Period  Weeks    Status  Achieved    Target Date  10/26/19        PT Long Term Goals - 12/27/19 0930      PT LONG TERM GOAL #1   Title  Patient will be independent in final HEP to maintain progress made in PT    Time  8    Period  Weeks    Status  On-going    Target Date  01/18/20      PT LONG TERM GOAL #2   Title  Patient will exhibit improved cervical motion to [facility] to allow for ability to perform all grooming and dressing tasks without pain or limitation    Baseline  Progressing toward goal, see flowsheet    Time  8    Period  Weeks    Status  On-going    Target Date  01/18/20      PT LONG TERM GOAL #3   Title   Patient will exhibit improved periscapular strength of >/= 4/5 MMT and DNF endurance >/= 20 sec to allow for improved postural control and lifting ability    Baseline  Continues to exhibit significant DNF endurance deficit    Time  8    Period  Weeks    Status  On-going    Target Date  01/18/20      PT LONG TERM GOAL #4   Title  Patient will be able to perform heavy household chores such as vaccuuming or lifting objects into overhead cabinets  with no difficulty    Time  8    Period  Weeks    Status  On-going    Target Date  01/18/20      PT LONG TERM GOAL #5   Title  Patient will report improved function of </= 46% limitation on FOTO    Baseline  She did exceed this goal on 11/01/2019 at 45% but is currently at 47% limitation this visit    Time  8    Period  Weeks    Status  On-going    Target Date  01/18/20            Plan - 01/03/20 1118    Clinical Impression Statement  Fair progress with therapy with mild improvement but tendency persistent/returning pain between sessions. Abbreviated tx. time given late arrival but focus manual therapy with brief ROM/stretches and performance of dry needling again all with good tolerance. Progress with therapy LTGs ongoing.    Personal Factors and Comorbidities  Past/Current Experience;Time since onset of injury/illness/exacerbation    Examination-Activity Limitations  Carry;Lift;Sleep    Examination-Participation Restrictions  Cleaning;Meal Prep;Community Activity;Laundry;Yard Work    [employer]  Evolving/Moderate complexity  Clinical Decision Making  Moderate    Rehab Potential  Good    PT Frequency  1x / week    PT Duration  8 weeks    PT Treatment/Interventions  ADLs/Self Care Home Management;Cryotherapy;Electrical Stimulation;Moist Heat;Traction;Neuromuscular re-education;Therapeutic exercise;Therapeutic activities;Patient/family education;Manual techniques;Dry needling;Passive range of motion;Taping;Spinal  Manipulations;Joint Manipulations    PT Next Visit Plan  continue manual, postural strengthening, DNF endurance, stretches, further dry needling as needed    PT Home Exercise Plan  Upper trap and levator stretching, doorway pec stretch, supine chin tuck, row with red, extension with red, supine horiz abd with yellow, double ER with yellow    Consulted and Agree with Plan of Care  Patient       Patient will benefit from skilled therapeutic intervention in order to improve the following deficits and impairments:  Decreased range of motion, Pain, Increased muscle spasms, Decreased activity tolerance, Postural dysfunction, Decreased strength, Impaired flexibility  Visit Diagnosis: Cervicalgia  Muscle weakness (generalized)     Problem List Patient Active Problem List   Diagnosis Date Noted  . Hyperparathyroidism ([place]) 12/10/2019  . Cervical myofascial pain syndrome 09/16/2019  . Hypercalcemia 08/22/2019  . Chronic left shoulder pain 08/22/2019  . Hyperlipidemia 03/19/2019  . 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 Essential hypertension 01/12/2019  . Hyperthyroidism 01/12/2019  . Neuropathy 01/12/2019    Beaulah Dinning, PT, DPT 01/03/20 11:21 AM  [facility] [address] [place], [place], [address] Phone: [phone]   Fax:  [phone]  Name: Elizabeth [name] MRN: [id number] Date of Birth: 28-May-1961

## 2020-01-08 ENCOUNTER — Telehealth: Payer: Self-pay | Admitting: Internal Medicine

## 2020-01-08 NOTE — Telephone Encounter (Signed)
Pt was seen by you on 01/02/2020 for ear issues. Pt is requesting a referral to ENT due to still having problems.

## 2020-01-08 NOTE — Addendum Note (Signed)
Addended by: Orland Mustard on: 01/08/2020 04:51 PM   Modules accepted: Orders

## 2020-01-08 NOTE — Telephone Encounter (Signed)
Pt called and would like to have a referral for ENT she is still having ear issue

## 2020-01-08 NOTE — Telephone Encounter (Signed)
Referred [facility] she will need to get covid 19 tested per their protocol  [facility]

## 2020-01-08 NOTE — Telephone Encounter (Signed)
Faxed via Epic routing °

## 2020-01-08 NOTE — Telephone Encounter (Signed)
-----   Message from Delorise Lindon, MD sent at 01/03/2020 12:29 PM EDT ----- Fax labs and note to Dr. Norval Gable endocrine Need response about sestamibi scan?

## 2020-01-09 NOTE — Telephone Encounter (Signed)
Patient informed and verbalized understanding

## 2020-01-10 ENCOUNTER — Encounter: Payer: Self-pay | Admitting: Physical Therapy

## 2020-01-10 ENCOUNTER — Other Ambulatory Visit: Payer: Self-pay

## 2020-01-10 ENCOUNTER — Ambulatory Visit: Payer: BLUE CROSS/BLUE SHIELD | Attending: Neurology | Admitting: Physical Therapy

## 2020-01-10 DIAGNOSIS — M542 Cervicalgia: Secondary | ICD-10-CM

## 2020-01-10 DIAGNOSIS — M6281 Muscle weakness (generalized): Secondary | ICD-10-CM | POA: Insufficient documentation

## 2020-01-10 NOTE — Therapy (Signed)
[place], [place], [address] Phone: [phone]   Fax:  [phone]  Physical Therapy Treatment  Patient Details  Name: Elizabeth [name] MRN: [id number] Date of Birth: 1961-06-23 Referring Provider (PT): Melvenia Beam, MD   Encounter Date: 01/10/2020  PT End of Session - 01/10/20 0928    Visit Number  11    Number of Visits  14    Date for PT Re-Evaluation  01/18/20    Authorization Type  BCBS    PT Start Time  [phone]   patient arrived late   PT Stop Time  1000    PT Time Calculation (min)  38 min    Activity Tolerance  Patient tolerated treatment well    Behavior During Therapy  [facility] for tasks assessed/performed       Past Medical History:  Diagnosis Date  . Gout   . Hypertension   . Thyroid disease   . Vertigo     Past Surgical History:  Procedure Laterality Date  . ENDOMETRIAL ABLATION  2010    There were no vitals filed for this visit.  Subjective Assessment - 01/10/20 0925    Subjective  Patient she feels her neck is still the same, hurts on both sides in upper trap region when she turns to both sides. She does note that the spasms have gotten better.    Patient Stated Goals  Get rid of neck pain and stop episodes of weakness    Currently in Pain?  Yes    Pain Score  6     Pain Location  Neck    Pain Orientation  Right;Left    Pain Descriptors / Indicators  Tightness   "just pain"   Pain Type  Chronic pain    Pain Radiating Towards  bilateral upper trap region    Pain Onset  More than a month ago    Pain Frequency  Constant    Aggravating Factors   Turning head side to side    Pain Relieving Factors  Stretching, therapy (transient relief)         [facility] PT Assessment - 01/10/20 0001      AROM   Overall AROM Comments  Patient exhibits good cervical rotation but reports pain with movement    Cervical - Right Rotation  70    Cervical - Left Rotation  70                    [facility] Adult PT Treatment/Exercise - 01/10/20 0001      Exercises   Exercises  Neck      Neck Exercises: Theraband   Shoulder Extension  15 reps;Red   2 sets   Rows  15 reps;Green   2 sets   Shoulder External Rotation  15 reps;Red   2 sets   Shoulder External Rotation Limitations  cued for scap retraction    Horizontal ABduction  15 reps;Red   2 sets   Horizontal ABduction Limitations  supine      Neck Exercises: Seated   Neck Retraction  10 reps;3 secs    Neck Retraction Limitations  Tactile and verbal cueing to perform correctly    Other Seated Exercise  Seated thoracic extension over towel with hands behind grasped behind neck and elbows wide x10 with 3 second hold      Neck Exercises: Stretches   Upper Trapezius Stretch  2 reps;20 seconds    Levator Stretch  2 reps;20  seconds    Chest Stretch  3 reps;20 seconds   one side at a time   Chest Stretch Limitations  Doorway with elbow above shoulder, varied height of elbow to find most comfortable position             PT Education - 01/10/20 0928    Education Details  HEP update    Person(s) Educated  Patient    Methods  Explanation;Demonstration;Tactile cues;Verbal cues    Comprehension  Verbalized understanding;Returned demonstration;Verbal cues required;Tactile cues required;Need further instruction       PT Short Term Goals - 11/23/19 1023      PT SHORT TERM GOAL #1   Title  Patient will be I with initial HEP to progress in PT    Time  4    Period  Weeks    Status  On-going    Target Date  10/26/19      PT SHORT TERM GOAL #2   Title  Patient will report improved resting neck pain of </= 2-3 pain level to improve activity tolerance    Baseline  Previously achieved but having more pain now    Time  4    Period  Weeks    Status  On-going    Target Date  12/21/19      PT SHORT TERM GOAL #3   Title  Patient will be able to perform light household tasks such as cleaning with no  difficulty    Time  4    Period  Weeks    Status  Achieved    Target Date  10/26/19      PT SHORT TERM GOAL #4   Title  Patient will exhibit improved ROM of right rotation to 65 deg to allow for improved driving ability without pain or limitation    Baseline  67    Time  4    Period  Weeks    Status  Achieved    Target Date  10/26/19        PT Long Term Goals - 12/27/19 0930      PT LONG TERM GOAL #1   Title  Patient will be independent in final HEP to maintain progress made in PT    Time  8    Period  Weeks    Status  On-going    Target Date  01/18/20      PT LONG TERM GOAL #2   Title  Patient will exhibit improved cervical motion to [facility] to allow for ability to perform all grooming and dressing tasks without pain or limitation    Baseline  Progressing toward goal, see flowsheet    Time  8    Period  Weeks    Status  On-going    Target Date  01/18/20      PT LONG TERM GOAL #3   Title  Patient will exhibit improved periscapular strength of >/= 4/5 MMT and DNF endurance >/= 20 sec to allow for improved postural control and lifting ability    Baseline  Continues to exhibit significant DNF endurance deficit    Time  8    Period  Weeks    Status  On-going    Target Date  01/18/20      PT LONG TERM GOAL #4   Title  Patient will be able to perform heavy household chores such as vaccuuming or lifting objects into overhead cabinets  with no difficulty    Time  8  Period  Weeks    Status  On-going    Target Date  01/18/20      PT LONG TERM GOAL #5   Title  Patient will report improved function of </= 46% limitation on FOTO    Baseline  She did exceed this goal on 11/01/2019 at 45% but is currently at 47% limitation this visit    Time  8    Period  Weeks    Status  On-going    Target Date  01/18/20            Plan - 01/10/20 0929    Clinical Impression Statement  Patient tolerated therapy well with no adverse effects. She continues to report pain in bilateral  neck/upper trap region with cervical rotation bilaterally. Therapy today focused on exercises and progressing postural control. Her HEP was updated this visit with good tolerance, added thoracic extension exercise. She has one more visit scheduled and anticipate that being the last visit as patient has demonstrated functional improvement but has has not report neck pain getting better.    PT Treatment/Interventions  ADLs/Self Care Home Management;Cryotherapy;Electrical Stimulation;Moist Heat;Traction;Neuromuscular re-education;Therapeutic exercise;Therapeutic activities;Patient/family education;Manual techniques;Dry needling;Passive range of motion;Taping;Spinal Manipulations;Joint Manipulations    PT Next Visit Plan  continue manual, postural strengthening, DNF endurance, stretches, further dry needling as needed    PT Home Exercise Plan  [id number]: Upper trap and levator stretching, doorway pec stretch, supine chin tuck, row with greed, extension with red, supine horiz abd with red, double ER with red, seated thoracic extension    Consulted and Agree with Plan of Care  Patient       Patient will benefit from skilled therapeutic intervention in order to improve the following deficits and impairments:  Decreased range of motion, Pain, Increased muscle spasms, Decreased activity tolerance, Postural dysfunction, Decreased strength, Impaired flexibility  Visit Diagnosis: Cervicalgia  Muscle weakness (generalized)     Problem List Patient Active Problem List   Diagnosis Date Noted  . Dysfunction of both eustachian tubes 01/03/2020  . Allergic rhinitis 01/03/2020  . Hyperparathyroidism ([place]) 12/10/2019  . Cervical myofascial pain syndrome 09/16/2019  . Hypercalcemia 08/22/2019  . Chronic left shoulder pain 08/22/2019  . Hyperlipidemia 03/19/2019  . 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 Essential hypertension 01/12/2019  . Hyperthyroidism 01/12/2019  . Neuropathy 01/12/2019    Hilda Blades, PT, DPT, LAT, ATC 01/10/20  10:01 AM Phone: [phone] Fax: [facility] [address] [place], [place], [address] Phone: [phone]   Fax:  [phone]  Name: Taaliyah Pasciak MRN: [id number] Date of Birth: 11/01/1960

## 2020-01-10 NOTE — Patient Instructions (Signed)
Access Code: [facility] URL: [email or web address] Date: 11/23/2019 Prepared by: Hilda Blades  Exercises Gentle Upper Trap Stretch - 2 x daily - 7 x weekly - 2 reps - 20 seconds hold Gentle Levator Scapulae Stretch - 2 x daily - 7 x weekly - 2 reps - 20 seconds hold Seated Thoracic Lumbar Extension with Pectoralis Stretch - 2 x daily - 7 x weekly - 10 reps - 3 seconds hold Single Arm Doorway Pec Stretch at 120 Degrees Abduction - 2 x daily - 7 x weekly - 2 reps - 20 seconds hold Supine Chin Tuck - 2 x daily - 7 x weekly - 10 reps - 5 seconds hold Standing Bilateral Low Shoulder Row with Anchored Resistance - 1 x daily - 7 x weekly - 10 reps - 3 seconds hold - 2 sets Scapular Retraction with Resistance Advanced - 1 x daily - 7 x weekly - 15 reps - 3 seconds hold - 2 sets Shoulder External Rotation and Scapular Retraction with Resistance - 1 x daily - 7 x weekly - 15 reps - 2 sets Supine Shoulder Horizontal Abduction with Resistance - 1 x daily - 7 x weekly - 15 reps - 2 sets

## 2020-01-11 ENCOUNTER — Ambulatory Visit: Payer: BLUE CROSS/BLUE SHIELD | Attending: Internal Medicine

## 2020-01-11 DIAGNOSIS — Z20822 Contact with and (suspected) exposure to covid-19: Secondary | ICD-10-CM

## 2020-01-11 NOTE — Progress Notes (Signed)
FYI per endocrine Dr. Kelton Pillar   for hyperCa and HTN   You may use calcium channel blockers as well as clonidine,  hydralazine if indicated etc , you can treat her HTN as usual except the HCTZ .   HCTZ is NOT contra-indicated but its preferable to avoid it while we are working her up , as it will increase the serum calcium level and will falsely lower her urinary excretion of calcium.     There's certain criteria for surgery that has to be met , typically the pt is sent to endocrinology for this evaluation but I've seen PCP 's who are comfortable enough to make that decision, I am  just not at this point yet , as my evaluation is not complete. Hypercalcemia is a chronic condition and there's no rush to send someone to surgery, because the worst thing that can happen is for the surgery to occur and a few months later hypercalcemia is back due to misdiagnosis.

## 2020-01-12 LAB — NOVEL CORONAVIRUS, NAA: SARS-CoV-2, NAA: NOT DETECTED

## 2020-01-12 LAB — SARS-COV-2, NAA 2 DAY TAT

## 2020-01-13 LAB — VITAMIN D 1,25 DIHYDROXY
Vitamin D 1, 25 (OH)2 Total: 79 pg/mL — ABNORMAL HIGH
Vitamin D2 1, 25 (OH)2: 25 pg/mL
Vitamin D3 1, 25 (OH)2: 54 pg/mL

## 2020-01-13 LAB — PTH-RELATED PEPTIDE: PTH-related peptide: <2 pmol/L

## 2020-01-13 LAB — CORTISOL: Cortisol: 6.7 ug/dL

## 2020-01-15 NOTE — Telephone Encounter (Signed)
Pt wanted to talk to Dr. Derrel Nip only about her ENT referral

## 2020-01-15 NOTE — Telephone Encounter (Signed)
Spoke with pt. She stated that she had her covid test done on Friday and result was negative. I advised pt to call [facility] to let them know and schedule her appt. Pt gave a verbal understanding.

## 2020-01-17 ENCOUNTER — Ambulatory Visit: Payer: BLUE CROSS/BLUE SHIELD | Admitting: Physical Therapy

## 2020-01-17 ENCOUNTER — Other Ambulatory Visit: Payer: Self-pay

## 2020-01-17 ENCOUNTER — Encounter: Payer: Self-pay | Admitting: Physical Therapy

## 2020-01-17 DIAGNOSIS — M542 Cervicalgia: Secondary | ICD-10-CM

## 2020-01-17 DIAGNOSIS — M6281 Muscle weakness (generalized): Secondary | ICD-10-CM

## 2020-01-17 NOTE — Therapy (Signed)
[place], [place], [address] Phone: [phone]   Fax:  [phone]  Physical Therapy Treatment/Discharge  Patient Details  Name: Elizabeth [name] MRN: [id number] Date of Birth: 09-15-1960 Referring Provider (PT): Melvenia Beam, MD   Encounter Date: 01/17/2020  PT End of Session - 01/17/20 1042    Visit Number  12    Number of Visits  14    Date for PT Re-Evaluation  01/18/20    Authorization Type  BCBS    PT Start Time  [phone]   pt. arrived a few minutes late   PT Stop Time  1020    PT Time Calculation (min)  42 min    Activity Tolerance  Patient tolerated treatment well    Behavior During Therapy  [facility] for tasks assessed/performed       Past Medical History:  Diagnosis Date  . Gout   . Hypertension   . Thyroid disease   . Vertigo     Past Surgical History:  Procedure Laterality Date  . ENDOMETRIAL ABLATION  2010    There were no vitals filed for this visit.  Subjective Assessment - 01/17/20 1030    Subjective  Pt. reports left-sided neck and upper trapezius symptoms flared up yesterday with some radiating pain into left posterior and lateral aspect of head. No specific exacerbating cause noted. Discussed status and given limited lasting improvement with PT and continued/persistent high pain level plan d/c to HEP.    Limitations  House hold activities;Lifting    Patient Stated Goals  Get rid of neck pain and stop episodes of weakness    Currently in Pain?  Yes    Pain Score  7     Pain Location  Neck    Pain Orientation  Left    Pain Descriptors / Indicators  Tightness    Pain Type  Chronic pain    Pain Radiating Towards  left levator and upper trapezius region    Pain Onset  More than a month ago    Pain Frequency  Constant    Aggravating Factors   turning head side to side    Pain Relieving Factors  stretching, temporary ease with PT    Effect of Pain on Daily Activities  limited  ability heavy household activities         [facility] PT Assessment - 01/17/20 0001      Observation/Other Assessments   Focus on Therapeutic Outcomes (FOTO)   58% limited      AROM   Cervical Flexion  45    Cervical Extension  25    Cervical - Right Side Bend  32    Cervical - Left Side Bend  28    Cervical - Right Rotation  60    Cervical - Left Rotation  70      Strength   Right Shoulder Flexion  4+/5    Right Shoulder ABduction  4+/5    Right Shoulder Internal Rotation  5/5    Right Shoulder External Rotation  4+/5    Left Shoulder Flexion  4+/5    Left Shoulder ABduction  4+/5    Left Shoulder Internal Rotation  5/5    Left Shoulder External Rotation  4+/5                    [facility] Adult PT Treatment/Exercise - 01/17/20 0001      Neck Exercises: Seated   Other Seated Exercise  HEP review-review and practice levator and upper trapezius stretches (on left side today), doorway vs. corner pec stretch, Theraband row, ext, horiz. abd and ER      Manual Therapy   Joint Mobilization  thoracic PAs grade I-III    Soft tissue mobilization  left levator and upper trapezius region       Trigger Point Dry Needling - 01/17/20 0001    Consent Given?  Yes    Muscles Treated Head and Neck  Upper trapezius;Levator scapulae   left side   Dry Needling Comments  needling in prone with 30 gauge 30 mm needles    Electrical Stimulation Performed with Dry Needling  Yes    E-stim with Dry Needling Details  TENS 20 pps x 10 minutes           PT Education - 01/17/20 1042    Education Details  HEP review, POC, Theracane use for self trigger point release    Person(s) Educated  Patient    Methods  Explanation;Demonstration;Verbal cues    Comprehension  Verbalized understanding;Returned demonstration       PT Short Term Goals - 01/17/20 1046      PT SHORT TERM GOAL #1   Title  Patient will be I with initial HEP to progress in PT    Baseline  met    Time  4    Period   Weeks    Status  Achieved      PT SHORT TERM GOAL #2   Title  Patient will report improved resting neck pain of </= 2-3 pain level to improve activity tolerance    Baseline  6-7/10    Time  4    Period  Weeks    Status  Not Met      PT SHORT TERM GOAL #3   Title  Patient will be able to perform light household tasks such as cleaning with no difficulty    Baseline  still with intermittent limitations with high pain level    Time  4    Period  Weeks    Status  Not Met      PT SHORT TERM GOAL #4   Title  Patient will exhibit improved ROM of right rotation to 65 deg to allow for improved driving ability without pain or limitation    Baseline  previously met but 60 deg today    Time  4    Period  Weeks    Status  Not Met        PT Long Term Goals - 01/17/20 1049      PT LONG TERM GOAL #1   Title  Patient will be independent in final HEP to maintain progress made in PT    Baseline  met    Time  8    Period  Weeks    Status  On-going      PT LONG TERM GOAL #2   Title  Patient will exhibit improved cervical motion to [facility] to allow for ability to perform all grooming and dressing tasks without pain or limitation    Baseline  partiall met, right rotation still limited    Time  8    Period  Weeks    Status  Partially Met      PT LONG TERM GOAL #3   Title  Patient will exhibit improved periscapular strength of >/= 4/5 MMT and DNF endurance >/= 20 sec to allow for improved postural control and lifting ability  Baseline  Continues to exhibit significant DNF endurance deficit    Time  8    Period  Weeks    Status  On-going      PT LONG TERM GOAL #4   Title  Patient will be able to perform heavy household chores such as vaccuuming or lifting objects into overhead cabinets  with no difficulty    Baseline  still intermittently limited with pain    Time  8    Period  Weeks    Status  Not Met      PT LONG TERM GOAL #5   Title  Patient will report improved function of </= 46%  limitation on FOTO    Baseline  58% limited    Time  8    Period  Weeks    Status  On-going            Plan - 01/17/20 1043    Clinical Impression Statement  Temporary symptom ease with therapy visits but pt. has now attended 12 visits with limited lasting improvement/tendency persistent high pain level with pain frequently 6-7/10. Given lack of recent progress and pt. independent with HEP plan d/c with pt. to try continuing with home exercises and Theracane use for self-trigger point release. No further formal PT planned at this time and if symptoms persistent recommend MD follow up for further tx. options.    Personal Factors and Comorbidities  Past/Current Experience;Time since onset of injury/illness/exacerbation    Examination-Activity Limitations  Carry;Lift;Sleep    Examination-Participation Restrictions  Cleaning;Meal Prep;Community Activity;Laundry;Yard Work    [employer]  Evolving/Moderate complexity    Clinical Decision Making  Moderate    Rehab Potential  Good    PT Frequency  1x / week    PT Duration  8 weeks    PT Treatment/Interventions  ADLs/Self Care Home Management;Cryotherapy;Electrical Stimulation;Moist Heat;Traction;Neuromuscular re-education;Therapeutic exercise;Therapeutic activities;Patient/family education;Manual techniques;Dry needling;Passive range of motion;Taping;Spinal Manipulations;Joint Manipulations    PT Next Visit Plan  NA    PT Home Exercise Plan  [id number]: Upper trap and levator stretching, doorway pec stretch, supine chin tuck, row with greed, extension with red, supine horiz abd with red, double ER with red, seated thoracic extension    Consulted and Agree with Plan of Care  Patient       Patient will benefit from skilled therapeutic intervention in order to improve the following deficits and impairments:  Decreased range of motion, Pain, Increased muscle spasms, Decreased activity tolerance, Postural dysfunction, Decreased  strength, Impaired flexibility  Visit Diagnosis: Cervicalgia  Muscle weakness (generalized)     Problem List Patient Active Problem List   Diagnosis Date Noted  . Dysfunction of both eustachian tubes 01/03/2020  . Allergic rhinitis 01/03/2020  . Hyperparathyroidism ([place]) 12/10/2019  . Cervical myofascial pain syndrome 09/16/2019  . Hypercalcemia 08/22/2019  . Chronic left shoulder pain 08/22/2019  . Hyperlipidemia 03/19/2019  . 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 Essential hypertension 01/12/2019  . Hyperthyroidism 01/12/2019  . Neuropathy 01/12/2019        PHYSICAL THERAPY DISCHARGE SUMMARY  Visits from Start of Care: 12  Current functional level related to goals / functional outcomes: See above. Temporary symptom ease with therapy but continues with persistent pain.   Remaining deficits: Shoulder/periscapular weakness and limited deep neck flexor endurance, cervical tightness   Education / Equipment: HEP Plan: Patient agrees to discharge.  Patient goals were partially met. Patient is being discharged due to lack of progress.  ?????  Beaulah Dinning, PT, DPT 01/17/20 10:54 AM       [facility] [address] [place], [place], [address] Phone: [phone]   Fax:  [phone]  Name: Elizabeth [name] MRN: [id number] Date of Birth: [birth year]

## 2020-01-18 ENCOUNTER — Telehealth: Payer: Self-pay | Admitting: Internal Medicine

## 2020-01-18 NOTE — Telephone Encounter (Signed)
Pt would like four more weeks of physical therapy. Please call back

## 2020-01-21 NOTE — Telephone Encounter (Signed)
Ok to continue PT for 4 more weeks  Dr Jaynee Eagles ordered it originally so if it needs to be ordered formally let me know

## 2020-01-23 ENCOUNTER — Telehealth: Payer: Self-pay | Admitting: Neurology

## 2020-01-23 DIAGNOSIS — M7918 Myalgia, other site: Secondary | ICD-10-CM

## 2020-01-23 DIAGNOSIS — M542 Cervicalgia: Secondary | ICD-10-CM

## 2020-01-23 NOTE — Telephone Encounter (Signed)
Pt  States she just finished her therapy.  Pt is asking if Dr Jaynee Eagles will approve 4 additional weeks of physical therapy.  Please call

## 2020-01-23 NOTE — Telephone Encounter (Signed)
Absolutely, please do!! Thanks!

## 2020-01-23 NOTE — Telephone Encounter (Addendum)
Spoke with Ubaldo Glassing at [facility]. She checked with pt's PT. They cannot accept a verbal order for extension since the pt was discharged at the last visit. They will need a new referral.   Referral placed.

## 2020-01-23 NOTE — Addendum Note (Signed)
Addended by: Gildardo Griffes on: 01/23/2020 12:16 PM   Modules accepted: Orders

## 2020-01-24 ENCOUNTER — Ambulatory Visit
Admission: RE | Admit: 2020-01-24 | Discharge: 2020-01-24 | Disposition: A | Discharge status: Home / Self Care | Payer: BLUE CROSS/BLUE SHIELD | Source: Ambulatory Visit | Attending: Internal Medicine | Admitting: Internal Medicine

## 2020-01-24 DIAGNOSIS — H6980 Other specified disorders of Eustachian tube, unspecified ear: Secondary | ICD-10-CM | POA: Diagnosis not present

## 2020-01-24 DIAGNOSIS — R928 Other abnormal and inconclusive findings on diagnostic imaging of breast: Secondary | ICD-10-CM | POA: Insufficient documentation

## 2020-01-24 DIAGNOSIS — R922 Inconclusive mammogram: Secondary | ICD-10-CM | POA: Diagnosis not present

## 2020-01-24 DIAGNOSIS — E210 Primary hyperparathyroidism: Secondary | ICD-10-CM | POA: Diagnosis not present

## 2020-01-24 DIAGNOSIS — N6312 Unspecified lump in the right breast, upper inner quadrant: Secondary | ICD-10-CM | POA: Diagnosis not present

## 2020-01-24 DIAGNOSIS — J301 Allergic rhinitis due to pollen: Secondary | ICD-10-CM | POA: Diagnosis not present

## 2020-01-24 DIAGNOSIS — H903 Sensorineural hearing loss, bilateral: Secondary | ICD-10-CM | POA: Diagnosis not present

## 2020-01-24 NOTE — Telephone Encounter (Signed)
Spoke with pt to let her know that PT is okay to continue for 4 more weeks. Pt stated that she received a call today to schedule the next appt.

## 2020-01-26 ENCOUNTER — Other Ambulatory Visit: Payer: Self-pay | Admitting: Internal Medicine

## 2020-01-26 DIAGNOSIS — N6310 Unspecified lump in the right breast, unspecified quadrant: Secondary | ICD-10-CM

## 2020-01-30 ENCOUNTER — Ambulatory Visit: Payer: BLUE CROSS/BLUE SHIELD | Attending: Internal Medicine

## 2020-01-30 DIAGNOSIS — Z23 Encounter for immunization: Secondary | ICD-10-CM

## 2020-01-30 NOTE — Progress Notes (Signed)
   Covid-19 Vaccination Clinic  Name:  Elizabeth [name]    MRN: [id number] DOB: 24-Nov-1960  01/30/2020  Ms. Kunst was observed post Covid-19 immunization for 15 minutes without incident. She was provided with Vaccine Information Sheet and instruction to access the V-Safe system.   Ms. Ragans was instructed to call 911 with any severe reactions post vaccine: Marland Kitchen Difficulty breathing  . Swelling of face and throat  . A fast heartbeat  . A bad rash all over body  . Dizziness and weakness   Immunizations Administered    Name Date Dose VIS Date Route   Pfizer COVID-19 Vaccine 01/30/2020 10:11 AM 0.3 mL 10/31/2018 Intramuscular   Manufacturer: [employer], [employer]   Lot: [id number]   [place]: [id number]

## 2020-02-14 ENCOUNTER — Ambulatory Visit: Payer: BLUE CROSS/BLUE SHIELD | Admitting: Physical Therapy

## 2020-02-19 ENCOUNTER — Telehealth: Payer: Self-pay | Admitting: Internal Medicine

## 2020-02-19 NOTE — Telephone Encounter (Signed)
Pt was seen for this on 01/02/2020. Pt was referred to ENT in [place]. Pt would like to see someone in [place], pt is still having problems with her ear.

## 2020-02-19 NOTE — Telephone Encounter (Signed)
Patient would like a referral ENT [place]. Patient is still having problems with her ear. Patient already saw a ENT in [place] and she is still having problems.

## 2020-02-20 ENCOUNTER — Other Ambulatory Visit: Payer: Self-pay

## 2020-02-20 ENCOUNTER — Encounter: Payer: Self-pay | Admitting: Physical Therapy

## 2020-02-20 ENCOUNTER — Ambulatory Visit: Payer: BLUE CROSS/BLUE SHIELD | Attending: Neurology | Admitting: Physical Therapy

## 2020-02-20 DIAGNOSIS — M542 Cervicalgia: Secondary | ICD-10-CM | POA: Insufficient documentation

## 2020-02-20 DIAGNOSIS — M6281 Muscle weakness (generalized): Secondary | ICD-10-CM | POA: Diagnosis not present

## 2020-02-20 NOTE — Therapy (Signed)
[place], [place], [address] Phone: [phone]   Fax:  [phone]  Physical Therapy Treatment/Re-evaluation  Patient Details  Name: Elizabeth [name] MRN: [id number] Date of Birth: 02/20/1961 Referring Provider (PT): Sarina Ill, MD   Encounter Date: 02/20/2020   PT End of Session - 02/20/20 1225    Visit Number 13    Number of Visits 24    Date for PT Re-Evaluation 04/02/20    Authorization Type BCBS    PT Start Time 1148    PT Stop Time 1232    PT Time Calculation (min) 44 min    Behavior During Therapy [facility] for tasks assessed/performed           Past Medical History:  Diagnosis Date  . Gout   . Hypertension   . Thyroid disease   . Vertigo     Past Surgical History:  Procedure Laterality Date  . ENDOMETRIAL ABLATION  2010    There were no vitals filed for this visit.   Subjective Assessment - 02/20/20 1221    Subjective Pt. returns to therapy-previous d/c at last visit 01/17/20 but pt. contacted MD and received new referral to return to PT. She reports previous PT was helpful to reduce incidence of cervical/upper trapezius region spasms and tendency episodic "heaviness" in muscles. Primary current complaints include right>left upper trapezius region pain and tightness. Recent symptoms worse on right but reports side of symptoms can change to left side as well.    Limitations House hold activities;Lifting    Patient Stated Goals Get rid of neck pain and stop episodes of weakness    Currently in Pain? Yes    Pain Score --   6-7/10   Pain Location Neck    Pain Orientation Right    Pain Descriptors / Indicators Tightness;Heaviness    Pain Type Chronic pain    Pain Radiating Towards upper trapeziue region    Pain Onset More than a month ago    Pain Frequency Constant    Aggravating Factors  turning head side to side    Pain Relieving Factors stretches, dry needling    Effect of Pain on  Daily Activities limits ability heavy chores, impacts positional tolerance              [facility] PT Assessment - 02/20/20 0001      Assessment   Medical Diagnosis Cervical myofascial pain, musculoskeletal neck pain    Referring Provider (PT) Sarina Ill, MD    Onset Date/Surgical Date --   05/2019 with exacerbation approximately 1 month ago   Hand Dominance Right    Prior Therapy recent PT with d/c 01/17/20      Precautions   Precautions None      Restrictions   Weight Bearing Restrictions No      Balance Screen   Has the patient fallen in the past 6 months No      AROM   Cervical Flexion 50    Cervical Extension 28    Cervical - Right Side Bend 40    Cervical - Left Side Bend 28    Cervical - Right Rotation 41    Cervical - Left Rotation 70      Strength   Strength Assessment Site Elbow;Wrist    Right Shoulder Flexion 4+/5    Right Shoulder ABduction 4/5    Right Shoulder Internal Rotation 5/5    Right Shoulder External Rotation 4+/5    Left Shoulder Flexion 4+/5  Left Shoulder ABduction 4+/5    Left Shoulder Internal Rotation 5/5    Left Shoulder External Rotation 4+/5    Right/Left Elbow Right;Left    Left Elbow Flexion 5/5    Left Elbow Extension 5/5    Right/Left Wrist Right;Left    Right Wrist Flexion 4/5    Right Wrist Extension 4+/5    Left Wrist Flexion 5/5    Left Wrist Extension 5/5      Palpation   Palpation comment tight with associated trigger points and tenderness right upper + middle trapezius region                         [facility] Adult PT Treatment/Exercise - 02/20/20 0001      Manual Therapy   Soft tissue mobilization seated STM right upper and middle trapezius region focus            Trigger Point Dry Needling - 02/20/20 0001    Consent Given? Yes    Muscles Treated Head and Neck Upper trapezius;Levator scapulae    Dry Needling Comments needling to bilat. sides in prone with 32 gaiuge 30 mm needles    Electrical  Stimulation Performed with Dry Needling Yes    E-stim with Dry Needling Details TENS 2 pps x 10 minutes                PT Education - 02/20/20 1225    Education Details POC    Person(s) Educated Patient    Methods Explanation    Comprehension Verbalized understanding            PT Short Term Goals - 02/20/20 1244      PT SHORT TERM GOAL #1   Title Increase right cervical rotation AROM to 50 deg or greater to improve ability to turn head while driving    Baseline 41 deg    Time 3    Period Weeks    Status New    Target Date 03/12/20      PT SHORT TERM GOAL #2   Title Decrease episodes of neck spasm/flare up to no more than 3 episodes per week to improve activity tolerance for chores    Baseline recent/frequent flare ups    Time 3    Period Weeks    Status New    Target Date 03/12/20             PT Long Term Goals - 02/20/20 1229      PT LONG TERM GOAL #1   Title Patient will be independent in advanced HEP to maintain progress made in PT    Baseline will instruct/update as appropriate    Time 6    Period Weeks    Status Revised    Target Date 04/02/20      PT LONG TERM GOAL #2   Title Increase right cervical rotation AROM to 60 deg or greater to improve ability to turn head while driving    Baseline 41 deg    Time 6    Period Weeks    Status Revised    Target Date 04/02/20      PT LONG TERM GOAL #3   Title Patient will exhibit DNF endurance >/= 20 sec to allow for improved postural control and lifting ability    Baseline Continues to exhibit significant DNF endurance deficit    Time 6    Period Weeks    Status Revised    Target Date  04/02/20      PT LONG TERM GOAL #4   Title Pt. to be able to perform heavy household chores such as vacuuming, cleaning with neck pain consistently 5/10 or less    Baseline 6-7/10    Time 6    Period Weeks    Status Revised    Target Date 04/02/20                 Plan - 02/20/20 1226    Clinical  Impression Statement Pt. returns with continued cervical/upper trapezius region myofascial pain with associated muscle tightness/trigger points and decreased cervical ROM. New PT referral received and pt. reporting benefit from previous tx. to decrease episodes of symptom exacerbation so plan resume/continue PT for potential further progress.    Personal Factors and Comorbidities Past/Current Experience;Time since onset of injury/illness/exacerbation    Examination-Activity Limitations Carry;Lift;Sleep    Examination-Participation Restrictions Cleaning;Meal Prep;Community Activity;Laundry;Yard Work    [employer] Evolving/Moderate complexity    Clinical Decision Making Moderate    Rehab Potential Fair    PT Frequency --   1-2x/week   PT Duration 6 weeks    PT Treatment/Interventions ADLs/Self Care Home Management;Cryotherapy;Electrical Stimulation;Moist Heat;Traction;Neuromuscular re-education;Therapeutic exercise;Therapeutic activities;Patient/family education;Manual techniques;Dry needling;Passive range of motion;Taping;Spinal Manipulations;Joint Manipulations;Ultrasound    PT Next Visit Plan dry needling to upper trapezius and levator region bilat. vs. side of predominate symptoms, STM, gentle stretches and manual traction, gentle postural strengthening as tolerated    PT Home Exercise Plan [id number]: Upper trap and levator stretching, doorway pec stretch, supine chin tuck, row with greed, extension with red, supine horiz abd with red, double ER with red, seated thoracic extension    Consulted and Agree with Plan of Care Patient           Patient will benefit from skilled therapeutic intervention in order to improve the following deficits and impairments:  Decreased range of motion, Pain, Increased muscle spasms, Decreased activity tolerance, Postural dysfunction, Decreased strength, Impaired flexibility  Visit Diagnosis: Cervicalgia  Muscle weakness  (generalized)     Problem List Patient Active Problem List   Diagnosis Date Noted  . Dysfunction of both eustachian tubes 01/03/2020  . Allergic rhinitis 01/03/2020  . Hyperparathyroidism ([place]) 12/10/2019  . Cervical myofascial pain syndrome 09/16/2019  . Hypercalcemia 08/22/2019  . Chronic left shoulder pain 08/22/2019  . Hyperlipidemia 03/19/2019  . 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 Essential hypertension 01/12/2019  . Hyperthyroidism 01/12/2019  . Neuropathy 01/12/2019    Beaulah Dinning, PT, DPT 02/20/20 12:55 PM  [place] [facility] [address] [place], [place], [address] Phone: [phone]   Fax:  [phone]  Name: Elizabeth [name] MRN: [id number] Date of Birth: 09-08-1960

## 2020-02-21 NOTE — Addendum Note (Signed)
Addended by: Orland Mustard on: 02/21/2020 06:52 PM   Modules accepted: Orders

## 2020-02-21 NOTE — Telephone Encounter (Signed)
New referral placed ent in [place]

## 2020-02-25 ENCOUNTER — Ambulatory Visit: Payer: BLUE CROSS/BLUE SHIELD | Attending: Internal Medicine

## 2020-02-25 DIAGNOSIS — Z23 Encounter for immunization: Secondary | ICD-10-CM

## 2020-02-25 NOTE — Progress Notes (Signed)
   Covid-19 Vaccination Clinic  Name:  Elizabeth [name]    MRN: [id number] DOB: [birth year]  02/25/2020  Elizabeth [name] was observed post Covid-19 immunization for 15 minutes without incident. She was provided with Vaccine Information Sheet and instruction to access the V-Safe system.   Elizabeth [name] was instructed to call 911 with any severe reactions post vaccine: Marland Kitchen Difficulty breathing  . Swelling of face and throat  . A fast heartbeat  . A bad rash all over body  . Dizziness and weakness   Immunizations Administered    Name Date Dose VIS Date Route   Pfizer COVID-19 Vaccine 02/25/2020  9:43 AM 0.3 mL 10/31/2018 Intramuscular   Manufacturer: [place]   Lot: [id number]   [place]: [id number]

## 2020-03-06 ENCOUNTER — Other Ambulatory Visit: Payer: Self-pay

## 2020-03-06 MED ORDER — FUROSEMIDE 20 MG PO TABS: 20.0000 mg | ORAL_TABLET | Freq: Every day | ORAL | 0 refills | Status: DC

## 2020-03-07 ENCOUNTER — Ambulatory Visit: Payer: BLUE CROSS/BLUE SHIELD | Attending: Neurology | Admitting: Physical Therapy

## 2020-03-07 ENCOUNTER — Encounter: Payer: Self-pay | Admitting: Physical Therapy

## 2020-03-07 ENCOUNTER — Other Ambulatory Visit: Payer: Self-pay

## 2020-03-07 DIAGNOSIS — M6281 Muscle weakness (generalized): Secondary | ICD-10-CM | POA: Diagnosis not present

## 2020-03-07 DIAGNOSIS — M542 Cervicalgia: Secondary | ICD-10-CM

## 2020-03-07 NOTE — Therapy (Signed)
[place], [place], [address] Phone: [phone]   Fax:  [phone]  Physical Therapy Treatment  Patient Details  Name: Elizabeth [name] MRN: [id number] Date of Birth: September 21, 1960 Referring Provider (PT): Sarina Ill, MD   Encounter Date: 03/07/2020   PT End of Session - 03/07/20 0837    Visit Number 14    Number of Visits 24    Date for PT Re-Evaluation 04/02/20    Authorization Type BCBS    PT Start Time 0808   arrived a few minutes late   PT Stop Time 0845   time spent for dry needling and estim not included in direct tx. minutes   PT Time Calculation (min) 37 min    Activity Tolerance Patient tolerated treatment well    Behavior During Therapy [facility] for tasks assessed/performed           Past Medical History:  Diagnosis Date  . Gout   . Hypertension   . Thyroid disease   . Vertigo     Past Surgical History:  Procedure Laterality Date  . ENDOMETRIAL ABLATION  2010    There were no vitals filed for this visit.   Subjective Assessment - 03/07/20 0834    Subjective Pt. not seen for 2 weeks since re-eval visit due to schedule booked. She reports right upper trapezius region pain 6/10 this AM and notes some sensation of "heaviness" in right arm proximal to elbow.    Currently in Pain? Yes    Pain Score 6     Pain Location Neck    Pain Orientation Right    Pain Descriptors / Indicators Tightness;Heaviness    Pain Type Chronic pain    Pain Onset More than a month ago    Pain Frequency Constant    Aggravating Factors  turning head    Pain Relieving Factors stretches, dry needling                             [facility] Adult PT Treatment/Exercise - 03/07/20 0001      Manual Therapy   Soft tissue mobilization seated STM right upper trapezius and posterior scapular region    Manual Traction cervical      Neck Exercises: Stretches   Upper Trapezius Stretch Right;2 reps;30  seconds            Trigger Point Dry Needling - 03/07/20 0001    Consent Given? Yes    Muscles Treated Head and Neck Upper trapezius;Semispinalis capitus;Cervical multifidi    Muscles Treated Upper Quadrant Infraspinatus;Teres minor    Dry Needling Comments needling to right side only excepting left upper trapezius and cervical multifidi, all needling with 30-32 gauge 30 and 50 mm needles    Electrical Stimulation Performed with Dry Needling Yes    E-stim with Dry Needling Details TENS 20 pps x 10 minutes                PT Education - 03/07/20 0837    Education Details POC, potential etiology UE symptoms    Person(s) Educated Patient    Methods Explanation    Comprehension Verbalized understanding            PT Short Term Goals - 02/20/20 1244      PT SHORT TERM GOAL #1   Title Increase right cervical rotation AROM to 50 deg or greater to improve ability to turn head while driving  Baseline 41 deg    Time 3    Period Weeks    Status New    Target Date 03/12/20      PT SHORT TERM GOAL #2   Title Decrease episodes of neck spasm/flare up to no more than 3 episodes per week to improve activity tolerance for chores    Baseline recent/frequent flare ups    Time 3    Period Weeks    Status New    Target Date 03/12/20             PT Long Term Goals - 02/20/20 1229      PT LONG TERM GOAL #1   Title Patient will be independent in advanced HEP to maintain progress made in PT    Baseline will instruct/update as appropriate    Time 6    Period Weeks    Status Revised    Target Date 04/02/20      PT LONG TERM GOAL #2   Title Increase right cervical rotation AROM to 60 deg or greater to improve ability to turn head while driving    Baseline 41 deg    Time 6    Period Weeks    Status Revised    Target Date 04/02/20      PT LONG TERM GOAL #3   Title Patient will exhibit DNF endurance >/= 20 sec to allow for improved postural control and lifting ability     Baseline Continues to exhibit significant DNF endurance deficit    Time 6    Period Weeks    Status Revised    Target Date 04/02/20      PT LONG TERM GOAL #4   Title Pt. to be able to perform heavy household chores such as vacuuming, cleaning with neck pain consistently 5/10 or less    Baseline 6-7/10    Time 6    Period Weeks    Status Revised    Target Date 04/02/20                 Plan - 03/07/20 3500    Clinical Impression Statement Pt. continues with tightness in upper trapezius/cervical region with mild setback given delay in return to therapy. Tx. focus manual therapy and dry needling as noted per flowsheet. Differential diagnosis for RUE symptoms could include cervical/radicular origin but pt. also had trigger points in right periscapular region so could include myofascial pain referral pattern. Progress re: therapy goals ongoing.    Personal Factors and Comorbidities Past/Current Experience;Time since onset of injury/illness/exacerbation    Examination-Activity Limitations Carry;Lift;Sleep    Examination-Participation Restrictions Cleaning;Meal Prep;Community Activity;Laundry;Yard Work    [employer] officer Evolving/Moderate complexity    Rehab Potential Fair    PT Frequency --   1-2x/week   PT Duration 6 weeks    PT Treatment/Interventions ADLs/Self Care Home Management;Cryotherapy;Electrical Stimulation;Moist Heat;Traction;Neuromuscular re-education;Therapeutic exercise;Therapeutic activities;Patient/family education;Manual techniques;Dry needling;Passive range of motion;Taping;Spinal Manipulations;Joint Manipulations;Ultrasound    PT Next Visit Plan dry needling to upper trapezius and levator region bilat. vs. side of predominate symptoms, STM, gentle stretches and manual traction, gentle postural strengthening as tolerated    PT Home Exercise Plan [id number]: Upper trap and levator stretching, doorway pec stretch, supine chin tuck, row with greed,  extension with red, supine horiz abd with red, double ER with red, seated thoracic extension    Consulted and Agree with Plan of Care Patient           Patient will benefit from skilled therapeutic  intervention in order to improve the following deficits and impairments:  Decreased range of motion, Pain, Increased muscle spasms, Decreased activity tolerance, Postural dysfunction, Decreased strength, Impaired flexibility  Visit Diagnosis: Cervicalgia  Muscle weakness (generalized)     Problem List Patient Active Problem List   Diagnosis Date Noted  . Dysfunction of both eustachian tubes 01/03/2020  . Allergic rhinitis 01/03/2020  . Hyperparathyroidism ([place]) 12/10/2019  . Cervical myofascial pain syndrome 09/16/2019  . Hypercalcemia 08/22/2019  . Chronic left shoulder pain 08/22/2019  . Hyperlipidemia 03/19/2019  . 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 Essential hypertension 01/12/2019  . Hyperthyroidism 01/12/2019  . Neuropathy 01/12/2019    Beaulah Dinning, PT, DPT 03/07/20 8:42 AM  [facility] [address] [place], [place], [address] Phone: [phone]   Fax:  [phone]  Name: Elizabeth [name] MRN: [id number] Date of Birth: 14-Jan-1961

## 2020-03-12 ENCOUNTER — Ambulatory Visit: Payer: BLUE CROSS/BLUE SHIELD | Admitting: Physical Therapy

## 2020-03-12 ENCOUNTER — Other Ambulatory Visit: Payer: Self-pay

## 2020-03-12 ENCOUNTER — Encounter: Payer: Self-pay | Admitting: Physical Therapy

## 2020-03-12 DIAGNOSIS — M542 Cervicalgia: Secondary | ICD-10-CM | POA: Diagnosis not present

## 2020-03-12 DIAGNOSIS — M6281 Muscle weakness (generalized): Secondary | ICD-10-CM | POA: Diagnosis not present

## 2020-03-12 NOTE — Therapy (Signed)
[place], [place], [address] Phone: [phone]   Fax:  [phone]  Physical Therapy Treatment  Patient Details  Name: Elizabeth [name] MRN: [id number] Date of Birth: 1961/01/21 Referring Provider (PT): Sarina Ill, MD   Encounter Date: 03/12/2020   PT End of Session - 03/12/20 1005    Visit Number 15    Number of Visits 24    Date for PT Re-Evaluation 04/02/20    Authorization Type BCBS    PT Start Time 0928    PT Stop Time 1012    PT Time Calculation (min) 44 min    Activity Tolerance Patient tolerated treatment well    Behavior During Therapy [facility] for tasks assessed/performed           Past Medical History:  Diagnosis Date  . Gout   . Hypertension   . Thyroid disease   . Vertigo     Past Surgical History:  Procedure Laterality Date  . ENDOMETRIAL ABLATION  2010    There were no vitals filed for this visit.   Subjective Assessment - 03/12/20 0930    Subjective Pt. reports did well after last session. Not as much muscle soreness and "cramps" and pain still not as bad. Pain 5/10 this AM still more in right upper trapezius region.    Currently in Pain? Yes    Pain Score 5     Pain Location Neck    Pain Orientation Right    Pain Descriptors / Indicators Tightness;Heaviness    Pain Type Chronic pain    Pain Onset More than a month ago    Pain Frequency Constant    Aggravating Factors  turning head    Pain Relieving Factors stretches, dry needling    Effect of Pain on Daily Activities limits ability lifting for heavier chores, impacts positional tolerance              [facility] PT Assessment - 03/12/20 0001      AROM   Cervical - Right Rotation 62    Cervical - Left Rotation 51                         [facility] Adult PT Treatment/Exercise - 03/12/20 0001      Neck Exercises: Machines for Strengthening   Nustep L5 x 5 min UE/LE      Neck Exercises: Theraband    Shoulder Extension 20 reps;Green    Rows 20 reps;Blue    Rows Limitations cues to avoid shoulder shrug    Horizontal ABduction 15 reps;Red    Horizontal ABduction Limitations supine      Manual Therapy   Manual Therapy Neural Stretch    Soft tissue mobilization seated STM right upper trapezius and posterior scapular region    Manual Traction cervical    Neural Stretch right median nerve glide x 20 reps            Trigger Point Dry Needling - 03/12/20 0001    Consent Given? Yes    Muscles Treated Head and Neck Upper trapezius;Cervical multifidi    Dry Needling Comments needling to bilat. upper trapezius and right cervical multifidi at C7 region with 32 gauge 30 mm needles    Electrical Stimulation Performed with Dry Needling Yes    E-stim with Dry Needling Details TENS 2 pps x 10 minutes  PT Short Term Goals - 02/20/20 1244      PT SHORT TERM GOAL #1   Title Increase right cervical rotation AROM to 50 deg or greater to improve ability to turn head while driving    Baseline 41 deg    Time 3    Period Weeks    Status New    Target Date 03/12/20      PT SHORT TERM GOAL #2   Title Decrease episodes of neck spasm/flare up to no more than 3 episodes per week to improve activity tolerance for chores    Baseline recent/frequent flare ups    Time 3    Period Weeks    Status New    Target Date 03/12/20             PT Long Term Goals - 03/12/20 0946      PT LONG TERM GOAL #1   Title Patient will be independent in advanced HEP to maintain progress made in PT    Baseline will instruct/update as appropriate-added radial nerve glide 03/12/20    Time 6    Period Weeks    Status On-going      PT LONG TERM GOAL #2   Title Increase right cervical rotation AROM to 60 deg or greater to improve ability to turn head while driving    Baseline met, revise goal for left cervical rotation 60 deg or greater (limited to 51 deg on left 03/12/20)    Time 6    Period  Weeks    Status Revised      PT LONG TERM GOAL #3   Title Patient will exhibit DNF endurance >/= 20 sec to allow for improved postural control and lifting ability    Baseline Continues to exhibit significant DNF endurance deficit    Time 6    Period Weeks    Status On-going      PT LONG TERM GOAL #4   Title Pt. to be able to perform heavy household chores such as vacuuming, cleaning with neck pain consistently 5/10 or less    Baseline improving but goal ongoing for consistency/overall status    Time 6    Period Weeks    Status On-going                 Plan - 03/12/20 9735    Clinical Impression Statement Pt. did note some numbness in right thumb and index finger region-unclear if potentially associated with cervical/radicular etiology and previous history carpal tunnel, however symptoms more in radial nerve-advised pt. to follow up with Dr. Jaynee Eagles about this if symptoms persist. Improved progress from previous status regarding muscle pain and more lasting symptom relief otherwise. Added radial nerve glide to HEP and advised try chin tucks as well between now and next visit to assess for centralization response for radial nerve distribution symptoms.    Personal Factors and Comorbidities Past/Current Experience;Time since onset of injury/illness/exacerbation    Examination-Activity Limitations Carry;Lift;Sleep    Examination-Participation Restrictions Cleaning;Meal Prep;Community Activity;Laundry;Yard Work    [employer] officer Evolving/Moderate complexity    Clinical Decision Making Moderate    Rehab Potential Fair    PT Frequency --   1-2x/week   PT Duration 6 weeks    PT Treatment/Interventions ADLs/Self Care Home Management;Cryotherapy;Electrical Stimulation;Moist Heat;Traction;Neuromuscular re-education;Therapeutic exercise;Therapeutic activities;Patient/family education;Manual techniques;Dry needling;Passive range of motion;Taping;Spinal Manipulations;Joint  Manipulations;Ultrasound    PT Next Visit Plan check response nerve glidesand chin tucks for radial nerve distribution numbness. Continue dry needling to  upper trapezius and levator region bilat. vs. side of predominate symptoms, STM, gentle stretches and manual traction, gentle postural strengthening as tolerated    PT Home Exercise Plan [id number]: Upper trap and levator stretching, doorway pec stretch, supine chin tuck, row with greed, extension with red, supine horiz abd with red, double ER with red, seated thoracic extension    Consulted and Agree with Plan of Care Patient           Patient will benefit from skilled therapeutic intervention in order to improve the following deficits and impairments:  Decreased range of motion, Pain, Increased muscle spasms, Decreased activity tolerance, Postural dysfunction, Decreased strength, Impaired flexibility  Visit Diagnosis: Cervicalgia  Muscle weakness (generalized)     Problem List Patient Active Problem List   Diagnosis Date Noted  . Dysfunction of both eustachian tubes 01/03/2020  . Allergic rhinitis 01/03/2020  . Hyperparathyroidism ([place]) 12/10/2019  . Cervical myofascial pain syndrome 09/16/2019  . Hypercalcemia 08/22/2019  . Chronic left shoulder pain 08/22/2019  . Hyperlipidemia 03/19/2019  . 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 Essential hypertension 01/12/2019  . Hyperthyroidism 01/12/2019  . Neuropathy 01/12/2019    Beaulah Dinning, PT, DPT 03/12/20 10:07 AM  [place] [address] [place], [place], [address] Phone: [phone]   Fax:  [phone]  Name: Elizabeth [name] MRN: [id number] Date of Birth: [birth year]

## 2020-03-19 ENCOUNTER — Ambulatory Visit: Payer: BLUE CROSS/BLUE SHIELD | Admitting: Physical Therapy

## 2020-03-19 ENCOUNTER — Telehealth: Payer: Self-pay | Admitting: Physical Therapy

## 2020-03-19 NOTE — Telephone Encounter (Signed)
Called patient after 15 minutes after scheduled start of appointment time today. She reported was still en route to the clinic, running late. Informed there would be insufficient time for treatment today by the time she got to clinic so appointment cancelled for the day. Confirmed next scheduled appointment time for next week.

## 2020-03-26 ENCOUNTER — Ambulatory Visit: Payer: BLUE CROSS/BLUE SHIELD | Admitting: Physical Therapy

## 2020-03-26 ENCOUNTER — Encounter: Payer: Self-pay | Admitting: Physical Therapy

## 2020-03-26 ENCOUNTER — Other Ambulatory Visit: Payer: Self-pay

## 2020-03-26 DIAGNOSIS — M542 Cervicalgia: Secondary | ICD-10-CM | POA: Diagnosis not present

## 2020-03-26 DIAGNOSIS — M6281 Muscle weakness (generalized): Secondary | ICD-10-CM

## 2020-03-26 NOTE — Therapy (Signed)
[place], [place], [address] Phone: [phone]   Fax:  [phone]  Physical Therapy Treatment  Patient Details  Name: Elizabeth [name] MRN: [id number] Date of Birth: Jul 09, 1961 Referring Provider (PT): Sarina Ill, MD   Encounter Date: 03/26/2020   PT End of Session - 03/26/20 0940    Visit Number 16    Number of Visits 24    Date for PT Re-Evaluation 04/02/20    Authorization Type BCBS    PT Start Time 0925    PT Stop Time 1010    PT Time Calculation (min) 45 min    Activity Tolerance Patient tolerated treatment well    Behavior During Therapy [facility] for tasks assessed/performed           Past Medical History:  Diagnosis Date  . Gout   . Hypertension   . Thyroid disease   . Vertigo     Past Surgical History:  Procedure Laterality Date  . ENDOMETRIAL ABLATION  2010    There were no vitals filed for this visit.   Subjective Assessment - 03/26/20 0927    Subjective Pt. missed session last week due to some mix up about appointment time/running too late to be seen. She reports pain about the same and still noting general muscle tension. She also reports numbness in right index finger (had previously had more in thumb-no symptoms radiating from neck just local numbness in hand). Pain 6/10 this AM.                             [facility] Adult PT Treatment/Exercise - 03/26/20 0001      Neck Exercises: Supine   Neck Retraction 20 reps      Manual Therapy   Joint Mobilization thoracic PAs grade I-III    Soft tissue mobilization seated STM upper trapezius and levator region bilat.    Manual Traction cervical manual traction    Neural Stretch right median  and radial nerve glides x 10 for median nerve and x 15 for radial nerve      Neck Exercises: Stretches   Upper Trapezius Stretch Right;Left;2 reps;30 seconds    Levator Stretch Right;Left;2 reps;30 seconds    Other Neck  Stretches right posterior shoulder/rhomboid stretch 20 sec x 3            Trigger Point Dry Needling - 03/26/20 0001    Consent Given? Yes    Muscles Treated Head and Neck Upper trapezius;Levator scapulae   bilateral   Muscles Treated Upper Quadrant Rhomboids;Infraspinatus   right side   Dry Needling Comments needling in prone with 30-32 gauge 30 mm needles excepting 30 gauge 50 mm needle used for right infraspinatus    Electrical Stimulation Performed with Dry Needling Yes    E-stim with Dry Needling Details TENS 20 pps x 10 minutes                  PT Short Term Goals - 02/20/20 1244      PT SHORT TERM GOAL #1   Title Increase right cervical rotation AROM to 50 deg or greater to improve ability to turn head while driving    Baseline 41 deg    Time 3    Period Weeks    Status New    Target Date 03/12/20      PT SHORT TERM GOAL #2   Title Decrease episodes of neck spasm/flare up  to no more than 3 episodes per week to improve activity tolerance for chores    Baseline recent/frequent flare ups    Time 3    Period Weeks    Status New    Target Date 03/12/20             PT Long Term Goals - 03/12/20 0946      PT LONG TERM GOAL #1   Title Patient will be independent in advanced HEP to maintain progress made in PT    Baseline will instruct/update as appropriate-added radial nerve glide 03/12/20    Time 6    Period Weeks    Status On-going      PT LONG TERM GOAL #2   Title Increase right cervical rotation AROM to 60 deg or greater to improve ability to turn head while driving    Baseline met, revise goal for left cervical rotation 60 deg or greater (limited to 51 deg on left 03/12/20)    Time 6    Period Weeks    Status Revised      PT LONG TERM GOAL #3   Title Patient will exhibit DNF endurance >/= 20 sec to allow for improved postural control and lifting ability    Baseline Continues to exhibit significant DNF endurance deficit    Time 6    Period Weeks     Status On-going      PT LONG TERM GOAL #4   Title Pt. to be able to perform heavy household chores such as vacuuming, cleaning with neck pain consistently 5/10 or less    Baseline improving but goal ongoing for consistency/overall status    Time 6    Period Weeks    Status On-going                 Plan - 03/26/20 0940    Clinical Impression Statement Mild setback given not able to come to therapy last week and fair progress overall given tendency return of myofascial pain symptoms. Differential diagnosis for right hand/index finger symptoms could include radiculopathy (though symptoms local to hand/does not radiate from neck and pt. has previous history carpal tunnel so differential diagnosis could include local nerve issue as well) so recommend MD follow up for further assessment if this persists.    Personal Factors and Comorbidities Past/Current Experience;Time since onset of injury/illness/exacerbation    Examination-Activity Limitations Carry;Lift;Sleep    Examination-Participation Restrictions Cleaning;Meal Prep;Community Activity;Laundry;Yard Work    [employer] Evolving/Moderate complexity    Clinical Decision Making Moderate    Rehab Potential Fair    PT Frequency --   1-2x/week   PT Duration 6 weeks    PT Treatment/Interventions ADLs/Self Care Home Management;Cryotherapy;Electrical Stimulation;Moist Heat;Traction;Neuromuscular re-education;Therapeutic exercise;Therapeutic activities;Patient/family education;Manual techniques;Dry needling;Passive range of motion;Taping;Spinal Manipulations;Joint Manipulations;Ultrasound    PT Next Visit Plan Continue dry needling to upper trapezius and levator region bilat. vs. side of predominate symptoms, STM, gentle stretches and manual traction, gentle postural strengthening as tolerated    PT Home Exercise Plan [id number]: Upper trap and levator stretching, doorway pec stretch, supine chin tuck, row with green,  extension with red, supine horiz abd with red, double ER with red, seated thoracic extension    Consulted and Agree with Plan of Care Patient           Patient will benefit from skilled therapeutic intervention in order to improve the following deficits and impairments:  Decreased range of motion, Pain, Increased muscle spasms, Decreased activity tolerance, Postural  dysfunction, Decreased strength, Impaired flexibility  Visit Diagnosis: Cervicalgia  Muscle weakness (generalized)     Problem List Patient Active Problem List   Diagnosis Date Noted  . Dysfunction of both eustachian tubes 01/03/2020  . Allergic rhinitis 01/03/2020  . Hyperparathyroidism ([place]) 12/10/2019  . Cervical myofascial pain syndrome 09/16/2019  . Hypercalcemia 08/22/2019  . Chronic left shoulder pain 08/22/2019  . Hyperlipidemia 03/19/2019  . 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 Essential hypertension 01/12/2019  . Hyperthyroidism 01/12/2019  . Neuropathy 01/12/2019    Beaulah Dinning, PT, DPT 03/26/20 10:12 AM  [facility] [address] [place], [place], [address] Phone: [phone]   Fax:  [phone]  Name: Elizabeth [name] MRN: [id number] Date of Birth: 1961-06-18

## 2020-03-31 ENCOUNTER — Telehealth: Payer: Self-pay

## 2020-03-31 DIAGNOSIS — N6310 Unspecified lump in the right breast, unspecified quadrant: Secondary | ICD-10-CM

## 2020-03-31 NOTE — Telephone Encounter (Signed)
Reordered correct imaging for right breast mass per referral coordinator.

## 2020-04-02 ENCOUNTER — Encounter: Payer: Self-pay | Admitting: Physical Therapy

## 2020-04-02 ENCOUNTER — Other Ambulatory Visit: Payer: Self-pay

## 2020-04-02 ENCOUNTER — Ambulatory Visit: Payer: BLUE CROSS/BLUE SHIELD | Admitting: Physical Therapy

## 2020-04-02 DIAGNOSIS — M6281 Muscle weakness (generalized): Secondary | ICD-10-CM

## 2020-04-02 DIAGNOSIS — M542 Cervicalgia: Secondary | ICD-10-CM

## 2020-04-02 NOTE — Therapy (Signed)
[place], [place], [address] Phone: [phone]   Fax:  [phone]  Physical Therapy Treatment/Discharge  Patient Details  Name: Elizabeth [name] MRN: [id number] Date of Birth: 1960/11/20 Referring Provider (PT): Sarina Ill, MD   Encounter Date: 04/02/2020   PT End of Session - 04/02/20 1003    Visit Number 17    Number of Visits 24    Date for PT Re-Evaluation 04/02/20    Authorization Type BCBS    PT Start Time 0923    PT Stop Time 1012    PT Time Calculation (min) 49 min    Activity Tolerance Patient tolerated treatment well    Behavior During Therapy [facility] for tasks assessed/performed           Past Medical History:  Diagnosis Date  . Gout   . Hypertension   . Thyroid disease   . Vertigo     Past Surgical History:  Procedure Laterality Date  . ENDOMETRIAL ABLATION  2010    There were no vitals filed for this visit.   Subjective Assessment - 04/02/20 0925    Subjective Pt. returns for 4th visit since return for re-evaluation 02/20/20 with today as 17th visit overall since starting therapy. Temporary symptom ease with treatment but no significant lasting relief with continued pain in upper trapezius/levator region today more pronounced on right side at 7/10. P.t reports had had significant exacerbation of symptoms before originally starting therapy which has since improved but continues with persistent pain as noted.    Currently in Pain? Yes    Pain Score 7     Pain Location Neck    Pain Orientation Right    Pain Descriptors / Indicators Tightness;Heaviness    Pain Type Chronic pain    Pain Radiating Towards right upper trapezius region    Pain Onset More than a month ago    Pain Frequency Constant    Aggravating Factors  turning head    Pain Relieving Factors stretches, dry needling    Effect of Pain on Daily Activities impacts positional tolerance and ability for lifting for chores,  IADLs, turning head for activities such as driving              [facility] PT Assessment - 04/02/20 0001      AROM   Cervical Flexion 60    Cervical Extension 30    Cervical - Right Side Bend 26    Cervical - Left Side Bend 47    Cervical - Right Rotation 60    Cervical - Left Rotation 71      Strength   Right Shoulder Flexion 4+/5    Right Shoulder ABduction 4+/5    Right Shoulder Internal Rotation 5/5    Right Shoulder External Rotation 4+/5    Left Shoulder Flexion 4+/5    Left Shoulder ABduction 5/5    Left Shoulder Internal Rotation 5/5    Left Shoulder External Rotation 5/5    Right Elbow Flexion 4+/5    Right Elbow Extension 4+/5    Left Elbow Flexion 5/5    Left Elbow Extension 5/5    Right Wrist Flexion 4/5    Right Wrist Extension 4/5    Left Wrist Flexion 5/5    Left Wrist Extension 5/5                         [facility] Adult PT Treatment/Exercise - 04/02/20 0001  Neck Exercises: Supine   Neck Retraction 15 reps      Manual Therapy   Soft tissue mobilization seated STM upper trapezius and levator region bilat.    Manual Traction cervical manual traction      Neck Exercises: Stretches   Upper Trapezius Stretch Right;Left;3 reps;30 seconds    Levator Stretch Right;Left;3 reps;30 seconds    Other Neck Stretches supine manual pec minor stretch 2x30 sec            Trigger Point Dry Needling - 04/02/20 0001    Consent Given? Yes    Muscles Treated Head and Neck Upper trapezius;Levator scapulae   bilat.   Dry Needling Comments needling in prone with 32 gauge 30 and 50 mm needles    Electrical Stimulation Performed with Dry Needling Yes    E-stim with Dry Needling Details TENS 2 pps x 10 minutes                PT Education - 04/02/20 1006    Education Details POC    Person(s) Educated Patient    Methods Explanation    Comprehension Verbalized understanding            PT Short Term Goals - 04/02/20 1009      PT SHORT TERM  GOAL #1   Title Increase right cervical rotation AROM to 50 deg or greater to improve ability to turn head while driving    Baseline 60 deg    Time 3    Period Weeks    Status Achieved      PT SHORT TERM GOAL #2   Title Decrease episodes of neck spasm/flare up to no more than 3 episodes per week to improve activity tolerance for chores    Baseline recent/frequent flare ups    Time 3    Period Weeks    Status Not Met      PT SHORT TERM GOAL #3   Title Patient will be able to perform light household tasks such as cleaning with no difficulty    Baseline still with intermittent limitations with high pain level    Time 4    Period Weeks    Status Not Met      PT SHORT TERM GOAL #4   Title Patient will exhibit improved ROM of right rotation to 65 deg to allow for improved driving ability without pain or limitation    Baseline 60 deg    Time 4    Period Weeks    Status Not Met             PT Long Term Goals - 04/02/20 1009      PT LONG TERM GOAL #1   Title Patient will be independent in advanced HEP to maintain progress made in PT    Baseline met    Time 6    Period Weeks    Status Achieved      PT LONG TERM GOAL #2   Title Increase left cervical rotation AROM to 60 deg or greater to improve ability to turn head while driving    Baseline 71 deg left    Time 6    Period Weeks    Status Achieved      PT LONG TERM GOAL #3   Title Patient will exhibit DNF endurance >/= 20 sec to allow for improved postural control and lifting ability    Baseline Continues to exhibit significant DNF endurance deficit    Time 6  Period Weeks    Status Not Met      PT LONG TERM GOAL #4   Title Pt. to be able to perform heavy household chores such as vacuuming, cleaning with neck pain consistently 5/10 or less    Baseline 7/10    Time 6    Period Weeks    Status Not Met      PT LONG TERM GOAL #5   Title Patient will report improved function of </= 46% limitation on FOTO    Baseline  not met    Time 6    Period Weeks    Status Not Met                 Plan - 04/02/20 1006    Clinical Impression Statement Temporary symptom ease as noted but continues with pain/no signiifcant lasting relief and progress seems to have reached plateau. Given lack of lastin/significant functional improvement plan d/c to HEP. If pt. experiences as future changes in status recommened follow up with MD for new referral and in the meantime may consider options such as acupuncture or massage for maintenance treatment.    Personal Factors and Comorbidities Past/Current Experience;Time since onset of injury/illness/exacerbation    Examination-Activity Limitations Carry;Lift;Sleep    Examination-Participation Restrictions Cleaning;Meal Prep;Community Activity;Laundry;Yard Work    [employer] Evolving/Moderate complexity    Clinical Decision Making Moderate    Rehab Potential Fair    PT Frequency --   1-2x/week   PT Duration 6 weeks    PT Treatment/Interventions ADLs/Self Care Home Management;Cryotherapy;Electrical Stimulation;Moist Heat;Traction;Neuromuscular re-education;Therapeutic exercise;Therapeutic activities;Patient/family education;Manual techniques;Dry needling;Passive range of motion;Taping;Spinal Manipulations;Joint Manipulations;Ultrasound    PT Next Visit Plan NA    PT Home Exercise Plan [id number]: Upper trap and levator stretching, doorway pec stretch, supine chin tuck, row with green, extension with red, supine horiz abd with red, double ER with red, seated thoracic extension    Consulted and Agree with Plan of Care Patient           Patient will benefit from skilled therapeutic intervention in order to improve the following deficits and impairments:  Decreased range of motion, Pain, Increased muscle spasms, Decreased activity tolerance, Postural dysfunction, Decreased strength, Impaired flexibility  Visit Diagnosis: Cervicalgia  Muscle weakness  (generalized)     Problem List Patient Active Problem List   Diagnosis Date Noted  . Dysfunction of both eustachian tubes 01/03/2020  . Allergic rhinitis 01/03/2020  . Hyperparathyroidism ([place]) 12/10/2019  . Cervical myofascial pain syndrome 09/16/2019  . Hypercalcemia 08/22/2019  . Chronic left shoulder pain 08/22/2019  . Hyperlipidemia 03/19/2019  . 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 Essential hypertension 01/12/2019  . Hyperthyroidism 01/12/2019  . Neuropathy 01/12/2019      PHYSICAL THERAPY DISCHARGE SUMMARY  Visits from Start of Care: 17  Current functional level related to goals / functional outcomes: See above, d/c due to lack of significant lasting improvement   Remaining deficits: Cervical pain and tightness  Education / Equipment: HEP Plan: Patient agrees to discharge.  Patient goals were partially met. Patient is being discharged due to lack of progress.  ?????           Beaulah Dinning, PT, DPT 04/02/20 10:13 AM       [facility] [address] [place], [place], [address] Phone: [phone]   Fax:  [phone]  Name: Elizabeth [name] MRN: [id number] Date of Birth: March 17, 1961

## 2020-04-03 ENCOUNTER — Telehealth: Payer: Self-pay

## 2020-04-03 DIAGNOSIS — N6310 Unspecified lump in the right breast, unspecified quadrant: Secondary | ICD-10-CM

## 2020-04-03 DIAGNOSIS — R42 Dizziness and giddiness: Secondary | ICD-10-CM | POA: Insufficient documentation

## 2020-04-03 DIAGNOSIS — H93293 Other abnormal auditory perceptions, bilateral: Secondary | ICD-10-CM | POA: Insufficient documentation

## 2020-04-03 NOTE — Telephone Encounter (Signed)
Orders have been corrected.  

## 2020-04-03 NOTE — Telephone Encounter (Signed)
-----   Message from Ashley Jacobs sent at 04/01/2020  3:30 PM EDT ----- Regarding: RE: Mammo order Thank you! Also I need IMG 5535 I apologize. Thank you! ----- Message ----- From: Adair Laundry, CMA Sent: 03/31/2020  10:50 AM EDT To: Ashley Jacobs Subject: FW: Mammo order                                Corrected.   Janett Billow, CMA ----- Message ----- From: Ashley Jacobs Sent: 03/31/2020   9:51 AM EDT To: Adair Laundry, CMA Subject: Mammo order                                    Good morning!  Need new order for [place]. IMG 5532 and position on clock where mass is located. Please advise and Thank you!

## 2020-05-01 ENCOUNTER — Other Ambulatory Visit: Payer: Self-pay

## 2020-05-01 ENCOUNTER — Ambulatory Visit
Admission: RE | Admit: 2020-05-01 | Discharge: 2020-05-01 | Disposition: A | Discharge status: Home / Self Care | Payer: BLUE CROSS/BLUE SHIELD | Source: Ambulatory Visit | Attending: Internal Medicine | Admitting: Internal Medicine

## 2020-05-01 DIAGNOSIS — N6310 Unspecified lump in the right breast, unspecified quadrant: Secondary | ICD-10-CM | POA: Diagnosis not present

## 2020-05-01 DIAGNOSIS — R928 Other abnormal and inconclusive findings on diagnostic imaging of breast: Secondary | ICD-10-CM | POA: Diagnosis not present

## 2020-05-01 DIAGNOSIS — N6312 Unspecified lump in the right breast, upper inner quadrant: Secondary | ICD-10-CM | POA: Diagnosis not present

## 2020-05-05 ENCOUNTER — Encounter: Payer: Self-pay | Admitting: Internal Medicine

## 2020-05-05 ENCOUNTER — Telehealth (INDEPENDENT_AMBULATORY_CARE_PROVIDER_SITE_OTHER): Payer: BLUE CROSS/BLUE SHIELD | Admitting: Internal Medicine

## 2020-05-05 VITALS — BP 166/81 | Ht 65.0 in | Wt 185.0 lb

## 2020-05-05 DIAGNOSIS — F411 Generalized anxiety disorder: Secondary | ICD-10-CM | POA: Insufficient documentation

## 2020-05-05 DIAGNOSIS — E673 Hypervitaminosis D: Secondary | ICD-10-CM

## 2020-05-05 DIAGNOSIS — I10 Essential (primary) hypertension: Secondary | ICD-10-CM

## 2020-05-05 MED ORDER — PAROXETINE HCL 10 MG PO TABS: 5.0000 mg | ORAL_TABLET | Freq: Every day | ORAL | 2 refills | Status: DC

## 2020-05-05 MED ORDER — LOSARTAN POTASSIUM-HCTZ 100-25 MG PO TABS: 1.0000 | ORAL_TABLET | Freq: Every day | ORAL | 3 refills | Status: DC

## 2020-05-05 NOTE — Assessment & Plan Note (Signed)
Starting low dose paxil 10 mg ,  Patient advised to stat with 1/2 tablet daily with dinner for the first few days to avoid nausea.  Follow up one month

## 2020-05-05 NOTE — Addendum Note (Signed)
Addended by: Crecencio Mc on: 05/05/2020 05:18 PM   Modules accepted: Orders

## 2020-05-05 NOTE — Progress Notes (Addendum)
Telephone Note   This visit type was conducted due to national recommendations for restrictions regarding the COVID-19 pandemic (e.g. social distancing).  This format is felt to be most appropriate for this patient at this time.  All issues noted in this document were discussed and addressed.  No physical exam was performed (except for noted visual exam findings with Video Visits).   I connected with@ on 05/05/20 at  4:30 PM EDT by telephone and verified that I am speaking with the correct person using two identifiers. Location patient: home Location provider: work or home office Persons participating in the virtual visit: patient, provider  I discussed the limitations, risks, security and privacy concerns of performing an evaluation and management service by telephone and the availability of in person appointments. I also discussed with the patient that there may be a patient responsible charge related to this service. The patient expressed understanding and agreed to proceed.  Reason for visit: follow up on anxiety and hypertension   HPI:  59 yr old female with new onset anxiety presents with symptoms that were not improved with use of hydroxyzine.  Patient cites irritability,  Overreaction to minor conflicts, and insomnia as her primary symptoms.  No history of panic attacks or drug abuse . Not overeating. Does not drink alcohol. Denies any recent inciting events.    ROS: See pertinent positives and negatives per HPI.  Past Medical History:  Diagnosis Date  . Gout   . Hypertension   . Thyroid disease   . Vertigo     Past Surgical History:  Procedure Laterality Date  . ENDOMETRIAL ABLATION  2010    Family History  Problem Relation Age of Onset  . Hypertension Mother   . Heart disease Mother   . Heart failure Mother   . Hypertension Father     SOCIAL HX:  reports that she quit smoking about 21 years ago. She has never used smokeless tobacco. She reports previous alcohol use.  She reports that she does not use drugs.   Current Outpatient Medications:  .  cetirizine (ZYRTEC) 10 MG tablet, Take 1 tablet (10 mg total) by mouth at bedtime as needed for allergies., Disp: 90 tablet, Rfl: 3 .  colchicine 0.6 MG tablet, Take 2 tablets by mouth at onset of gout flare, then in 1 hour take 1 tablet by mouth., Disp: 3 tablet, Rfl: 0 .  FLUTICASONE PROPIONATE NA, Place into the nose., Disp: , Rfl:  .  furosemide (LASIX) 20 MG tablet, Take 1 tablet (20 mg total) by mouth daily., Disp: 90 tablet, Rfl: 0 .  gabapentin (NEURONTIN) 300 MG capsule, Take 300 mg by mouth daily as needed., Disp: , Rfl:  .  methocarbamol (ROBAXIN) 500 MG tablet, Take 1 tablet (500 mg total) by mouth 4 (four) times daily., Disp: 60 tablet, Rfl: 1 .  propranolol (INDERAL) 20 MG tablet, TAKE 1 TABLET(20 MG) BY MOUTH THREE TIMES DAILY, Disp: 270 tablet, Rfl: 0 .  propylthiouracil (PTU) 50 MG tablet, TAKE 1 TABLET(50 MG) BY MOUTH THREE TIMES DAILY, Disp: 270 tablet, Rfl: 0 .  sodium chloride (OCEAN) 0.65 % SOLN nasal spray, Place 2 sprays into both nostrils daily as needed for congestion. Before flonase 2 doses, Disp: 30 mL, Rfl: 11 .  losartan-hydrochlorothiazide (HYZAAR) 100-25 MG tablet, Take 1 tablet by mouth daily., Disp: 90 tablet, Rfl: 3 .  PARoxetine (PAXIL) 10 MG tablet, Take 0.5 tablets (5 mg total) by mouth daily. With dinner,  Increase to full tablet after  4 days, Disp: 30 tablet, Rfl: 2  EXAM:  VITALS per patient if applicable:  GENERAL: alert, oriented, appears well and in no acute distress  HEENT: atraumatic, conjunttiva clear, no obvious abnormalities on inspection of external nose and ears  NECK: normal movements of the head and neck  LUNGS: on inspection no signs of respiratory distress, breathing rate appears normal, no obvious gross SOB, gasping or wheezing  CV: no obvious cyanosis  MS: moves all visible extremities without noticeable abnormality  PSYCH/NEURO: pleasant and  cooperative, no obvious depression or anxiety, speech and thought processing grossly intact  ASSESSMENT AND PLAN:  Discussed the following assessment and plan:  Hypervitaminosis D - Plan: VITAMIN D 25 Hydroxy (Vit-D Deficiency, Fractures)  Generalized anxiety disorder  Essential hypertension - Plan: Comprehensive metabolic panel, Lipid panel  Generalized anxiety disorder Starting low dose paxil 10 mg ,  Patient advised to stat with 1/2 tablet daily with dinner for the first few days to avoid nausea.  Follow up one month   Essential hypertension Elevated today again.  Adding hctz to losartan  Lab Results  Component Value Date   CREATININE 0.92 11/09/2019   Lab Results  Component Value Date   NA 138 11/09/2019   K 3.4 (L) 11/09/2019   CL 107 11/09/2019   CO2 25 11/09/2019       I discussed the assessment and treatment plan with the patient. The patient was provided an opportunity to ask questions and all were answered. The patient agreed with the plan and demonstrated an understanding of the instructions.   The patient was advised to call back or seek an in-person evaluation if the symptoms worsen or if the condition fails to improve as anticipated.  I provided  25 minutes of non-face-to-face time during this encounter.   Crecencio Mc, MD

## 2020-05-05 NOTE — Assessment & Plan Note (Addendum)
Elevated today again.  Adding hctz to losartan  Lab Results  Component Value Date   CREATININE 0.92 11/09/2019   Lab Results  Component Value Date   NA 138 11/09/2019   K 3.4 (L) 11/09/2019   CL 107 11/09/2019   CO2 25 11/09/2019

## 2020-05-06 ENCOUNTER — Other Ambulatory Visit: Payer: Self-pay

## 2020-05-06 MED ORDER — FUROSEMIDE 20 MG PO TABS
20.0000 mg | ORAL_TABLET | Freq: Every day | ORAL | 0 refills | Status: DC
Start: 2020-05-06 — End: 2020-06-24

## 2020-05-07 DIAGNOSIS — H9042 Sensorineural hearing loss, unilateral, left ear, with unrestricted hearing on the contralateral side: Secondary | ICD-10-CM | POA: Diagnosis not present

## 2020-06-13 ENCOUNTER — Ambulatory Visit (INDEPENDENT_AMBULATORY_CARE_PROVIDER_SITE_OTHER): Payer: BLUE CROSS/BLUE SHIELD | Admitting: Internal Medicine

## 2020-06-13 ENCOUNTER — Other Ambulatory Visit: Payer: Self-pay

## 2020-06-13 VITALS — BP 180/100 | HR 67 | Ht 65.0 in | Wt 185.2 lb

## 2020-06-13 DIAGNOSIS — E213 Hyperparathyroidism, unspecified: Secondary | ICD-10-CM | POA: Diagnosis not present

## 2020-06-13 DIAGNOSIS — I10 Essential (primary) hypertension: Secondary | ICD-10-CM | POA: Diagnosis not present

## 2020-06-13 DIAGNOSIS — E0590 Thyrotoxicosis, unspecified without thyrotoxic crisis or storm: Secondary | ICD-10-CM | POA: Diagnosis not present

## 2020-06-13 LAB — COMPREHENSIVE METABOLIC PANEL
ALT: 19 U/L (ref 0–35)
AST: 19 U/L (ref 0–37)
Albumin: 4 g/dL (ref 3.5–5.2)
Alkaline Phosphatase: 106 U/L (ref 39–117)
BUN: 16 mg/dL (ref 6–23)
CO2: 25 mEq/L (ref 19–32)
Calcium: 10.8 mg/dL — ABNORMAL HIGH (ref 8.4–10.5)
Chloride: 106 mEq/L (ref 96–112)
Creatinine, Ser: 1.04 mg/dL (ref 0.40–1.20)
GFR: 58.27 mL/min — ABNORMAL LOW (ref >60.00)
Glucose, Bld: 102 mg/dL — ABNORMAL HIGH (ref 70–99)
Potassium: 3.8 mEq/L (ref 3.5–5.1)
Sodium: 137 mEq/L (ref 135–145)
Total Bilirubin: 0.5 mg/dL (ref 0.2–1.2)
Total Protein: 7.6 g/dL (ref 6.0–8.3)

## 2020-06-13 LAB — TSH: TSH: 1.77 u[IU]/mL (ref 0.35–4.50)

## 2020-06-13 LAB — T4, FREE: Free T4: 0.78 ng/dL (ref 0.60–1.60)

## 2020-06-13 LAB — VITAMIN D 25 HYDROXY (VIT D DEFICIENCY, FRACTURES): VITD: 25.35 ng/mL — ABNORMAL LOW (ref 30.00–100.00)

## 2020-06-13 MED ORDER — LOSARTAN POTASSIUM 100 MG PO TABS: 100.0000 mg | ORAL_TABLET | Freq: Every day | ORAL | 1 refills | Status: DC

## 2020-06-13 MED ORDER — METOPROLOL SUCCINATE ER 50 MG PO TB24: 50.0000 mg | ORAL_TABLET | Freq: Every day | ORAL | 1 refills | Status: DC

## 2020-06-13 NOTE — Patient Instructions (Addendum)
-   Stay hydrated - Avoid over the counter calcium  - Consume 2-3 servings of dairy daily in your diet  - Continue PTU 50 mg daily  - STOP Hyzaar - STOP propranolol  - START Losartan 100 mg , 1 tablet daily  - Start Metoprolol 50 mg,1 tablet daily  daily

## 2020-06-13 NOTE — Progress Notes (Signed)
Name: Elizabeth [name]  MRN/ DOB: [id number], [birth year]    Age/ Sex: 59 y.o., female     PCP: Crecencio Mc, MD   Reason for Endocrinology Evaluation: Hyperthyroidism/ Hypercalcemia      Initial Endocrinology Clinic Visit: 09/06/2019    PATIENT IDENTIFIER: Elizabeth [name] is a 59 y.o., female with a past medical history of HTN, Hyperthyroidism and Hypercalcemia . She has followed with [facility] since 09/06/2019 for consultative assistance with management of her Hypercalcemia and hyperthyroidism.   HISTORICAL SUMMARY: The patient was first diagnosed with hypercalcemia many years ago.  Ca/Cr ratio 0.013 in 11/2019 which is inconclusive.  DXA 09/25/2019 - normal results.    THYROID HISTORY :  Has been diagnosed with hyperthyroidism in early 2000's. She has been on PTU since her diagnosis.  SUBJECTIVE:     Today (06/13/2020):  Elizabeth [name] is here for hyperthyroidism and hypercalcemia.   She denies recently kidney stones Denies polyuria and polydipsia   Denies diarrhea.  No recent fever   Continues to have hair loss.   Pt on Vitamin D3 1000 iu daily     HISTORY:  Past Medical History:  Past Medical History:  Diagnosis Date  . Gout   . Hypertension   . Thyroid disease   . Vertigo    Past Surgical History:  Past Surgical History:  Procedure Laterality Date  . ENDOMETRIAL ABLATION  2010   Social History:  reports that she quit smoking about 21 years ago. She has never used smokeless tobacco. She reports previous alcohol use. She reports that she does not use drugs. Family History:  Family History  Problem Relation Age of Onset  . Hypertension Mother   . Heart disease Mother   . Heart failure Mother   . Hypertension Father      HOME MEDICATIONS: Allergies as of 06/13/2020      Reactions   Codeine    Gabapentin Other (See Comments)   Drowsiness and felt bad overall   Tramadol Other (See Comments)   Made her a zombie      Medication  List       Accurate as of June 13, 2020  7:24 AM. If you have any questions, ask your nurse or doctor.        cetirizine 10 MG tablet Commonly known as: ZYRTEC Take 1 tablet (10 mg total) by mouth at bedtime as needed for allergies.   colchicine 0.6 MG tablet Take 2 tablets by mouth at onset of gout flare, then in 1 hour take 1 tablet by mouth.   FLUTICASONE PROPIONATE NA Place into the nose.   furosemide 20 MG tablet Commonly known as: LASIX Take 1 tablet (20 mg total) by mouth daily.   gabapentin 300 MG capsule Commonly known as: NEURONTIN Take 300 mg by mouth daily as needed.   losartan-hydrochlorothiazide 100-25 MG tablet Commonly known as: HYZAAR Take 1 tablet by mouth daily.   methocarbamol 500 MG tablet Commonly known as: Robaxin Take 1 tablet (500 mg total) by mouth 4 (four) times daily.   PARoxetine 10 MG tablet Commonly known as: Paxil Take 0.5 tablets (5 mg total) by mouth daily. With dinner,  Increase to full tablet after 4 days   propranolol 20 MG tablet Commonly known as: INDERAL TAKE 1 TABLET(20 MG) BY MOUTH THREE TIMES DAILY   propylthiouracil 50 MG tablet Commonly known as: PTU TAKE 1 TABLET(50 MG) BY MOUTH THREE TIMES DAILY   sodium chloride 0.65 % Soln nasal  spray Commonly known as: OCEAN Place 2 sprays into both nostrils daily as needed for congestion. Before flonase 2 doses         OBJECTIVE:   PHYSICAL EXAM: VS: There were no vitals taken for this visit.   EXAM: General: Pt appears well and is in NAD  Hydration: Well-hydrated with moist mucous membranes and good skin turgor  Eyes: External eye exam normal without stare, lid lag or exophthalmos.  EOM intact.  PERRL.  Ears, Nose, Throat: Hearing: Grossly intact bilaterally Dental: Good dentition  Throat: Clear without mass, erythema or exudate  Neck: General: Supple without adenopathy. Thyroid: Thyroid size normal.  No goiter or nodules appreciated. No thyroid bruit.  Lungs:  Clear with good BS bilat with no rales, rhonchi, or wheezes  Heart: Auscultation: RRR.  Abdomen: Normoactive bowel sounds, soft, nontender, without masses or organomegaly palpable  Extremities: Gait and station: Normal gait  Digits and nails: No clubbing, cyanosis, petechiae, or nodes Head and neck: Normal alignment and mobility BL UE: Normal ROM and strength. BL LE: No pretibial edema normal ROM and strength.  Skin: Hair: Texture and amount normal with gender appropriate distribution Skin Inspection: No rashes, acanthosis nigricans/skin tags. No lipohypertrophy Skin Palpation: Skin temperature, texture, and thickness normal to palpation  Neuro: Cranial nerves: II - XII grossly intact  Cerebellar: Normal coordination and movement; no tremor Motor: Normal strength throughout DTRs: 2+ and symmetric in UE without delay in relaxation phase  Mental Status: Judgment, insight: Intact Orientation: Oriented to time, place, and person Memory: Intact for recent and remote events Mood and affect: No depression, anxiety, or agitation     DATA REVIEWED:  Results for Elizabeth, [name] (MRN [id number]) as of 06/16/2020 07:42  Ref. Range 06/13/2020 10:15  Sodium Latest Ref Range: 135 - 145 mEq/L 137  Potassium Latest Ref Range: 3.5 - 5.1 mEq/L 3.8  Chloride Latest Ref Range: 96 - 112 mEq/L 106  CO2 Latest Ref Range: 19 - 32 mEq/L 25  Glucose Latest Ref Range: 70 - 99 mg/dL 102 (H)  BUN Latest Ref Range: 6 - 23 mg/dL 16  Creatinine Latest Ref Range: 0.40 - 1.20 mg/dL 1.04  Calcium Latest Ref Range: 8.4 - 10.5 mg/dL 10.8 (H)  Alkaline Phosphatase Latest Ref Range: 39 - 117 U/L 106  Albumin Latest Ref Range: 3.5 - 5.2 g/dL 4.0  AST Latest Ref Range: 0 - 37 U/L 19  ALT Latest Ref Range: 0 - 35 U/L 19  Total Protein Latest Ref Range: 6.0 - 8.3 g/dL 7.6  Total Bilirubin Latest Ref Range: 0.2 - 1.2 mg/dL 0.5  GFR Latest Ref Range: >60.00 mL/min 58.27 (L)  VITD Latest Ref Range: 30.00 - 100.00 ng/mL  25.35 (L)  TSH Latest Ref Range: 0.35 - 4.50 uIU/mL 1.77  T4,Free(Direct) Latest Ref Range: 0.60 - 1.60 ng/dL 0.78      ASSESSMENT / PLAN / RECOMMENDATIONS:   Hyperthyroidism:   - Clinically she is euthyroid  - D/D graves' disease vs toxic thyroid nodule (s) - We discussed with pt the benefits of PTU in the Tx of hyperthyroidism, as well as the possible side effects/complications of anti-thyroid drug Tx (specifically detailing the rare, but serious side effect of agranulocytosis). We discussed the possible side effects of PTU including the chance of rash, the chance of liver irritation/juandice and the chance of sudden onset agranulocytosis.  - Pt would like to continue with PTU , as she is tolerating it well - Repeat TFT's remain normal  Medications : Continue PTU 50 mg daily    2. Hyperparathyroidism:  - Pt asymptomatic - 24- hr urine  Ca/Cr ratio 0.013 in 11/2019 which is inconclusive, but most likely Primary Hyperparathyroidism - We will continue to monitor  - DXA normal 09/2019 - She was encouraged to stay hydrated, avoid OTC calcium tablets but to consume 2-3 servings of calcium daily.  - Increase  Vitamin D3 to 2000 iu daily    3. HTN:    - Pt is c/o fatigue  - She has not been taking Hyzaar due to urinary frequency for the past month. She has propranolol prescribed TID but she has been taking it once daily . - She attributes her hypertension to hypercalcemia , and seems to be fixated on this. I explained to her that with her current BP readings she is at high risk for CVA and regardless of the cause, we need to adjust her anti-hypertensive agents - I also explained to her that hypercalcemia causes only slight elevated and not to this degree, we also discussed that HTN, is NOT one of the surgical criteria for parathyroidectomy  - Pt urged to have a follow up with PCP in the next 1-2 weeks for BP check and adjustments  - I am also going to check Aldo: renin ratio    Medication   - STOP Hyzaar - STOP propranolol  - START Losartan 100 mg , 1 tablet daily  - Start Metoprolol 50 mg,1 tablet daily  daily    F/U in 4 months     Signed electronically by: Mack Guise, MD  [facility] Endocrinology  [facility] [address] [place] [address] [place], [place] [address] Phone: [phone] FAX: [phone]      CC: Crecencio Mc, MD [address] [place] [address] Phone: [phone]  Fax: [phone]   Return to Endocrinology clinic as below: Future Appointments  Date Time Provider [place]  06/13/2020  9:30 AM Elizabeth [name], Melanie Crazier, MD [id number] None

## 2020-06-16 ENCOUNTER — Encounter: Payer: Self-pay | Admitting: Internal Medicine

## 2020-06-19 LAB — ALDOSTERONE + RENIN ACTIVITY W/ RATIO
ALDO / PRA Ratio: 33.3 Ratio — ABNORMAL HIGH (ref 0.9–28.9)
Aldosterone: 7 ng/dL
Renin Activity: 0.21 ng/mL/h — ABNORMAL LOW (ref 0.25–5.82)

## 2020-06-19 LAB — PARATHYROID HORMONE, INTACT (NO CA): PTH: 120 pg/mL — ABNORMAL HIGH (ref 14–64)

## 2020-06-23 ENCOUNTER — Encounter: Payer: Self-pay | Admitting: Internal Medicine

## 2020-06-23 ENCOUNTER — Ambulatory Visit (INDEPENDENT_AMBULATORY_CARE_PROVIDER_SITE_OTHER): Payer: BLUE CROSS/BLUE SHIELD | Admitting: Internal Medicine

## 2020-06-23 ENCOUNTER — Other Ambulatory Visit: Payer: Self-pay

## 2020-06-23 VITALS — BP 152/92 | HR 63 | Temp 98.4°F | Resp 15 | Ht 65.0 in | Wt 185.2 lb

## 2020-06-23 DIAGNOSIS — N6310 Unspecified lump in the right breast, unspecified quadrant: Secondary | ICD-10-CM

## 2020-06-23 DIAGNOSIS — I10 Essential (primary) hypertension: Secondary | ICD-10-CM

## 2020-06-23 DIAGNOSIS — M25511 Pain in right shoulder: Secondary | ICD-10-CM | POA: Diagnosis not present

## 2020-06-23 DIAGNOSIS — G5602 Carpal tunnel syndrome, left upper limb: Secondary | ICD-10-CM | POA: Diagnosis not present

## 2020-06-23 DIAGNOSIS — R110 Nausea: Secondary | ICD-10-CM

## 2020-06-23 DIAGNOSIS — E213 Hyperparathyroidism, unspecified: Secondary | ICD-10-CM

## 2020-06-23 DIAGNOSIS — G8929 Other chronic pain: Secondary | ICD-10-CM

## 2020-06-23 DIAGNOSIS — M7918 Myalgia, other site: Secondary | ICD-10-CM

## 2020-06-23 MED ORDER — AZILSARTAN MEDOXOMIL 40 MG PO TABS: 1.0000 | ORAL_TABLET | Freq: Every morning | ORAL | 5 refills | Status: DC

## 2020-06-23 MED ORDER — OMEPRAZOLE 40 MG PO CPDR: 40.0000 mg | DELAYED_RELEASE_CAPSULE | Freq: Every day | ORAL | 3 refills | Status: DC

## 2020-06-23 NOTE — Progress Notes (Signed)
Subjective:  Patient ID: Elizabeth [name], female    DOB: 03-08-1961  Age: 59 y.o. MRN: [id number]  CC: The primary encounter diagnosis was Chronic right shoulder pain. Diagnoses of Breast mass, right, Cervical myofascial pain syndrome, Carpal tunnel syndrome of left wrist, Primary hypertension, Nausea, Hypercalcemia, and Hyperparathyroidism ([place]) were also pertinent to this visit.  HPI [name] presents for follow up on multiple issues.  Last seen in December   This visit occurred during the SARS-CoV-2 public health emergency.  Safety protocols were in place, including screening questions prior to the visit, additional usage of staff PPE, and extensive cleaning of exam room while observing appropriate contact time as indicated for disinfecting solutions.   1) Follow up on hypertension :  Still elevated.  Not checking bp  at home. No daily  use of NSAIDS.  Taking meds .  Notes that her Sleep has been disrupted.  Wakes up groaning  Husband says that she snores very loudly.  Waking up tired, .  Husband had a home sleep study done recently for OSA.  She has never been screened with one. Wants to resume [place] rather than adjust current medications. Per patient Earnest Rosier controlled her BP for years before it was stopped due to noncoverage when she moved down here.   2) Intermittent nausea without vomiting .   Occurs intermittently for the past 2 weeks,  Lasts  1 minute and resolves spontaneously.  Often occurs while  eating , but other times when she is fasting . Previous episodes occurred and resolved with dietary changes. Has made dietary changes again and still happening . Drinks "plenty of water."  No black stools.  Uses motrin prn , "not often"    3) Right shoulder pain worse since she finished the PT for her neck pain . Pain starts in trapezius and radiates to shoulder blade.  Brought on by turning head to the right and raising arm Aggravated by sleeping on left side.  Tosses and turns a lot at  night.  MRI March 2021 reviewed,  She has cervical degenerative changes,  But no spinal stenosis    4) Hyperparathyroidism:  Discovered during workup for hypercalcemia.  Referred to Endocrinology for both hyperthyroidism management and for management of hyperparathyroidism .  Patient has not followed up with Endocrine since initial consult in February 2021,  Has not had DEXA scan , SPEP or UPEP.     Outpatient Medications Prior to Visit  Medication Sig Dispense Refill  . cetirizine (ZYRTEC) 10 MG tablet Take 1 tablet (10 mg total) by mouth at bedtime as needed for allergies. 90 tablet 3  . colchicine 0.6 MG tablet Take 2 tablets by mouth at onset of gout flare, then in 1 hour take 1 tablet by mouth. 3 tablet 0  . FLUTICASONE PROPIONATE NA Place into the nose.    . gabapentin (NEURONTIN) 300 MG capsule Take 300 mg by mouth daily as needed.    . methocarbamol (ROBAXIN) 500 MG tablet Take 1 tablet (500 mg total) by mouth 4 (four) times daily. 60 tablet 1  . metoprolol succinate (TOPROL-XL) 50 MG 24 hr tablet Take 1 tablet (50 mg total) by mouth daily. Take with or immediately following a meal. 90 tablet 1  . PARoxetine (PAXIL) 10 MG tablet Take 0.5 tablets (5 mg total) by mouth daily. With dinner,  Increase to full tablet after 4 days 30 tablet 2  . propylthiouracil (PTU) 50 MG tablet TAKE 1 TABLET(50 MG) BY MOUTH THREE TIMES  DAILY 270 tablet 0  . sodium chloride (OCEAN) 0.65 % SOLN nasal spray Place 2 sprays into both nostrils daily as needed for congestion. Before flonase 2 doses 30 mL 11  . losartan (COZAAR) 100 MG tablet Take 1 tablet (100 mg total) by mouth daily. 90 tablet 1  . furosemide (LASIX) 20 MG tablet Take 1 tablet (20 mg total) by mouth daily. (Patient not taking: Reported on 06/23/2020) 90 tablet 0   No facility-administered medications prior to visit.    Review of Systems;  Patient denies headache, fevers, malaise, unintentional weight loss, skin rash, eye pain, sinus  congestion and sinus pain, sore throat, dysphagia,  hemoptysis , cough, dyspnea, wheezing, chest pain, palpitations, orthopnea, edema, abdominal pain, nausea, melena, diarrhea, constipation, flank pain, dysuria, hematuria, urinary  Frequency, nocturia, numbness, tingling, seizures,  Focal weakness, Loss of consciousness,  Tremor, insomnia, depression, anxiety, and suicidal ideation.      Objective:  BP (!) 152/92 (BP Location: Left Arm, Patient Position: Sitting, Cuff Size: Large)   Pulse 63   Temp 98.4 F (36.9 C) (Oral)   Resp 15   Ht 5\' 5"  (1.651 m)   Wt 185 lb 3.2 oz (84 kg)   SpO2 98%   BMI 30.82 kg/m   BP Readings from Last 3 Encounters:  06/23/20 (!) 152/92  06/13/20 (!) 180/100  05/05/20 (!) 166/81    Wt Readings from Last 3 Encounters:  06/23/20 185 lb 3.2 oz (84 kg)  06/13/20 185 lb 3.2 oz (84 kg)  05/05/20 185 lb (83.9 kg)    General appearance: alert, cooperative and appears stated age Ears: normal TM's and external ear canals both ears Throat: lips, mucosa, and tongue normal; teeth and gums normal Neck: no adenopathy, no carotid bruit, supple, symmetrical, trachea midline and thyroid not enlarged, symmetric, no tenderness/mass/nodules Back: symmetric, no curvature. ROM normal. No CVA tenderness. Lungs: clear to auscultation bilaterally Heart: regular rate and rhythm, S1, S2 normal, no murmur, click, rub or gallop Abdomen: soft, non-tender; bowel sounds normal; no masses,  no organomegaly Pulses: 2+ and symmetric Skin: Skin color, texture, turgor normal. No rashes or lesions Lymph nodes: Cervical, supraclavicular, and axillary nodes normal.  Lab Results  Component Value Date   HGBA1C 5.7 08/23/2019   HGBA1C 5.8 01/23/2019    Lab Results  Component Value Date   CREATININE 1.04 06/13/2020   CREATININE 0.92 11/09/2019   CREATININE 0.92 09/04/2019    Lab Results  Component Value Date   WBC 5.5 09/06/2019   HGB 12.8 09/06/2019   HCT 39.0 09/06/2019    PLT 296.0 09/06/2019   GLUCOSE 102 (H) 06/13/2020   CHOL 238 (H) 02/27/2019   TRIG 125.0 02/27/2019   HDL 54.90 02/27/2019   LDLCALC 158 (H) 02/27/2019   ALT 19 06/13/2020   AST 19 06/13/2020   NA 137 06/13/2020   K 3.8 06/13/2020   CL 106 06/13/2020   CREATININE 1.04 06/13/2020   BUN 16 06/13/2020   CO2 25 06/13/2020   TSH 1.77 06/13/2020   HGBA1C 5.7 08/23/2019   MICROALBUR 3.3 (H) 08/23/2019    US BREAST LTD UNI RIGHT INC AXILLA  Result Date: 05/01/2020 CLINICAL DATA:  One year follow-up of right breast masses. EXAM: DIGITAL DIAGNOSTIC BILATERAL MAMMOGRAM WITH CAD AND TOMO ULTRASOUND RIGHT BREAST COMPARISON:  Previous exam(s). ACR Breast Density Category b: There are scattered areas of fibroglandular density. FINDINGS: There is a mass in the right breast on the MLO view posteriorly which is stable mammographically. No  other suspicious mammographic findings are seen bilaterally. Mammographic images were processed with CAD. On physical exam, no suspicious lumps are identified. Targeted ultrasound is performed, showing 2 stable masses in the right breast. The first at 2 o'clock, 7 cm from the nipple measures 10 x 4 by 5 mm today versus 9 x 3 x 5 mm in September of 2020. Another adjacent mass at 2 o'clock, 7 cm from the nipple measures 7 x 5 x 5 mm today versus 7 x 4 x 4 mm previously. IMPRESSION: Stable probably benign right breast masses. No other suspicious findings. RECOMMENDATION: Annual diagnostic mammography and ultrasound of the probably benign right breast masses. The patient will be due for bilateral mammography at that time. I have discussed the findings and recommendations with the patient. If applicable, a reminder letter will be sent to the patient regarding the next appointment. BI-RADS CATEGORY  3: Probably benign. Electronically Signed   By: Dorise Bullion III M.D   On: 05/01/2020 16:25   MM DIAG BREAST TOMO BILATERAL  Result Date: 05/01/2020 CLINICAL DATA:  One year  follow-up of right breast masses. EXAM: DIGITAL DIAGNOSTIC BILATERAL MAMMOGRAM WITH CAD AND TOMO ULTRASOUND RIGHT BREAST COMPARISON:  Previous exam(s). ACR Breast Density Category b: There are scattered areas of fibroglandular density. FINDINGS: There is a mass in the right breast on the MLO view posteriorly which is stable mammographically. No other suspicious mammographic findings are seen bilaterally. Mammographic images were processed with CAD. On physical exam, no suspicious lumps are identified. Targeted ultrasound is performed, showing 2 stable masses in the right breast. The first at 2 o'clock, 7 cm from the nipple measures 10 x 4 by 5 mm today versus 9 x 3 x 5 mm in September of 2020. Another adjacent mass at 2 o'clock, 7 cm from the nipple measures 7 x 5 x 5 mm today versus 7 x 4 x 4 mm previously. IMPRESSION: Stable probably benign right breast masses. No other suspicious findings. RECOMMENDATION: Annual diagnostic mammography and ultrasound of the probably benign right breast masses. The patient will be due for bilateral mammography at that time. I have discussed the findings and recommendations with the patient. If applicable, a reminder letter will be sent to the patient regarding the next appointment. BI-RADS CATEGORY  3: Probably benign. Electronically Signed   By: Dorise Bullion III M.D   On: 05/01/2020 16:25    Assessment & Plan:   Problem List Items Addressed This Visit      Unprioritized   Breast mass, right    Stable on repeat diagnostic bilateral mammogram  done at 1 year follow up in August.  Radiology has advised to resume annual diagnostic mammogram of right breast and screening mammogram of left breast next august (2022)      Cervical myofascial pain syndrome    Reviewed prior neurology evaluation.  Given her current pain despite PT ,  Will proceed with  Orthopedic evaluation of right shoulder to rule out contributing causes       CTS (carpal tunnel syndrome)    Hypercalcemia    Patient did not complete workup ordered by Endocrinology which was to include SPEP/UPEP and DEXA  Will order and advise patient to return to finish workup.  Lab Results  Component Value Date   CALCIUM 10.8 (H) 06/13/2020        Hyperparathyroidism (H[facility])    Mild, with normal DEXA .  Continue surveillance by Endocrine      Nausea    Recurrent  sporadic episodes lasting < 1 minute.  Trial of omeprazole for gastritis .  LFTs are normal   Lab Results  Component Value Date   ALT 19 06/13/2020   AST 19 06/13/2020   ALKPHOS 106 06/13/2020   BILITOT 0.5 06/13/2020         Primary hypertension    Elevated despite addition of metoprolol by Endocrinology during workup for hyperthyroidism.  Not taking furosemide.  Has been screened for hyperaldosteronism recently with equivocal results ( PAC is 7 and PAC/PRA is 33 )  .  She has deferred additional medications and additional workup and has requested to resume [place] which was changed to losartan for noncoverage by insurance.       Relevant Medications   Azilsartan Medoxomil 40 MG TABS    Other Visit Diagnoses    Chronic right shoulder pain    -  Primary   Relevant Orders   Ambulatory referral to Orthopedic Surgery    A total of 30 minutes was spent reviewing patient's chart, including accessing recrods form other institutions via Epic portal, counseling patient on the above mentioned issues , reviewing and explaining recent labs and imaging studies done, and coordination of care.  I have discontinued Roanna Sindelar's furosemide, losartan, and omeprazole. I am also having her start on Azilsartan Medoxomil. Additionally, I am having her maintain her FLUTICASONE PROPIONATE NA, colchicine, methocarbamol, propylthiouracil, gabapentin, cetirizine, sodium chloride, PARoxetine, and metoprolol succinate.  Meds ordered this encounter  Medications  . Azilsartan Medoxomil 40 MG TABS    Sig: Take 1 tablet by mouth every morning.     Dispense:  30 tablet    Refill:  5  . DISCONTD: omeprazole (PRILOSEC) 40 MG capsule    Sig: Take 1 capsule (40 mg total) by mouth daily.    Dispense:  30 capsule    Refill:  3    Medications Discontinued During This Encounter  Medication Reason  . losartan (COZAAR) 100 MG tablet   . omeprazole (PRILOSEC) 40 MG capsule   . furosemide (LASIX) 20 MG tablet     Follow-up: No follow-ups on file.   Crecencio Mc, MD

## 2020-06-23 NOTE — Patient Instructions (Addendum)
I have sent Edarbi to your pharmacy  If you start the edarbi,  Stop the losartan and CONTINUE THE METOPROLOL   After one week goal bp is 130/80 or less   Return one week after starting the medication for nurse visit.    Suspend all use of motrin aleve and advil (ibuprofen) .  They can cause gastritis. Madaline Brilliant to use tylenol up to 2000 mg daily in divided doses   (100 mg every 12 hours,  Etc)    Your shoulder pain and your neck pain are connected..  I want you to see a shoulder specialist and will make the referral

## 2020-06-24 ENCOUNTER — Telehealth: Payer: Self-pay | Admitting: Internal Medicine

## 2020-06-24 DIAGNOSIS — E213 Hyperparathyroidism, unspecified: Secondary | ICD-10-CM

## 2020-06-24 DIAGNOSIS — I10 Essential (primary) hypertension: Secondary | ICD-10-CM

## 2020-06-24 DIAGNOSIS — R110 Nausea: Secondary | ICD-10-CM | POA: Insufficient documentation

## 2020-06-24 DIAGNOSIS — N6310 Unspecified lump in the right breast, unspecified quadrant: Secondary | ICD-10-CM | POA: Insufficient documentation

## 2020-06-24 DIAGNOSIS — G5600 Carpal tunnel syndrome, unspecified upper limb: Secondary | ICD-10-CM | POA: Insufficient documentation

## 2020-06-24 NOTE — Assessment & Plan Note (Addendum)
Elevated despite addition of metoprolol by Endocrinology during workup for hyperthyroidism.  Not taking furosemide.  Has been screened for hyperaldosteronism recently with equivocal results ( PAC is 7 and PAC/PRA is 33 )  .  She has deferred additional medications and additional workup and has requested to resume [place] which was changed to losartan for noncoverage by insurance.

## 2020-06-24 NOTE — Assessment & Plan Note (Signed)
Recurrent sporadic episodes lasting < 1 minute.  Trial of omeprazole for gastritis .  LFTs are normal   Lab Results  Component Value Date   ALT 19 06/13/2020   AST 19 06/13/2020   ALKPHOS 106 06/13/2020   BILITOT 0.5 06/13/2020

## 2020-06-24 NOTE — Assessment & Plan Note (Signed)
Patient did not complete workup ordered by Endocrinology which was to include SPEP/UPEP and DEXA  Will order and advise patient to return to finish workup.  Lab Results  Component Value Date   CALCIUM 10.8 (H) 06/13/2020

## 2020-06-24 NOTE — Assessment & Plan Note (Signed)
MILD, managed non surgically for now.

## 2020-06-24 NOTE — Assessment & Plan Note (Signed)
Reviewed prior neurology evaluation.  Given her current pain despite PT ,  Will proceed with  Orthopedic evaluation of right shoulder to rule out contributing causes

## 2020-06-24 NOTE — Assessment & Plan Note (Signed)
Likely multifactorial  With cervical spine and shoulder arthropathy both contributing  She has aready had MRI cerivcal spine and neurology eval along with PT< and has continued pain in shoulder and with head turn toward shoulder.  Orthopedics referral in progress

## 2020-06-24 NOTE — Assessment & Plan Note (Signed)
Mild, with normal DEXA .  Continue surveillance by Endocrine

## 2020-06-24 NOTE — Assessment & Plan Note (Addendum)
Stable on repeat diagnostic bilateral mammogram  done at 1 year follow up in August.  Radiology has advised to resume annual diagnostic mammogram of right breast and screening mammogram of left breast next august (2022)

## 2020-06-26 ENCOUNTER — Telehealth: Payer: Self-pay | Admitting: Internal Medicine

## 2020-06-26 DIAGNOSIS — I10 Essential (primary) hypertension: Secondary | ICD-10-CM

## 2020-06-26 DIAGNOSIS — E0590 Thyrotoxicosis, unspecified without thyrotoxic crisis or storm: Secondary | ICD-10-CM

## 2020-06-26 NOTE — Telephone Encounter (Signed)
Duplicate

## 2020-06-26 NOTE — Telephone Encounter (Signed)
Discussed abnormal renin: aldo results.     Pt admits to consuming licorcie products but she also states its been a while since she had any .    Will proceed with 24-hr urine cortisol/cortisone and repeat renin aldo ratio  Discussed 24-hr collection as below   24-Hour Urine Collection   You will be collecting your urine for a 24-hour period of time.  Your timer starts with your first urine of the morning (For example - If you first pee at [place], your timer will start at [place])  [place] away your first urine of the morning  Collect your urine every time you pee for the next 24 hours STOP your urine collection 24 hours after you started the collection (For example - You would stop at 9AM the day after you started)     Abby Nena Jordan, MD  [facility] Endocrinology  [facility] [address] [place], [place] [address] Phone: [phone] FAX: [phone]

## 2020-07-09 ENCOUNTER — Ambulatory Visit (INDEPENDENT_AMBULATORY_CARE_PROVIDER_SITE_OTHER): Payer: BLUE CROSS/BLUE SHIELD | Admitting: *Deleted

## 2020-07-09 ENCOUNTER — Other Ambulatory Visit: Payer: Self-pay

## 2020-07-09 VITALS — BP 159/90 | HR 57

## 2020-07-09 DIAGNOSIS — I10 Essential (primary) hypertension: Secondary | ICD-10-CM | POA: Diagnosis not present

## 2020-07-09 DIAGNOSIS — M75101 Unspecified rotator cuff tear or rupture of right shoulder, not specified as traumatic: Secondary | ICD-10-CM | POA: Diagnosis not present

## 2020-07-09 DIAGNOSIS — M25511 Pain in right shoulder: Secondary | ICD-10-CM | POA: Diagnosis not present

## 2020-07-09 NOTE — Progress Notes (Addendum)
Patient here for nurse visit BP check per order from 06/23/20.   Patient reports compliance with prescribed BP medications: yes  Last dose of BP medication: 7:45 am Azilsartan and metoprolol  BP Readings from Last 3 Encounters:  07/09/20 (!) 159/90  06/23/20 (!) 152/92  06/13/20 (!) 180/100   Pulse Readings from Last 3 Encounters:  07/09/20 (!) 57  06/23/20 63  06/13/20 67      Patient verbalized understanding of instructions.   Kerin Salen, RN

## 2020-07-09 NOTE — Progress Notes (Signed)
Patient presenting with elevated blood pressure. Checked first with automatic cuff. BP was 185/85. Allowed Patient to sit and rechecked manually. BP was 160/90.   Patient states she took her medication this morning at 7:30 am. States it has been elevated at home and she has been having light headedness and headaches the last few days.

## 2020-07-17 ENCOUNTER — Telehealth: Payer: Self-pay | Admitting: Internal Medicine

## 2020-07-17 NOTE — Telephone Encounter (Signed)
Patient called to advise that she is having problems since starting the  metoprolol succinate   She is hard to breathe, very light headed, pain in both arms, very tired, just general malaise,  Call back number [phone]

## 2020-07-18 NOTE — Telephone Encounter (Signed)
Per DPR, left detail message for patient of Dr [facility] comments and for patient to call back if she can come in today at 4 pm

## 2020-07-18 NOTE — Telephone Encounter (Signed)
Spoken to patient to confirm and she stated that the symptoms have started since taking metoprolol. Please advise.

## 2020-07-18 NOTE — Telephone Encounter (Signed)
Please ask her to stop the metoprolol. Please see if she can come today at 4 pm ?  Thanks

## 2020-07-18 NOTE — Telephone Encounter (Signed)
Spoken to patient and notified Dr Quin Hoop comments. Verbalized understanding. We have schedule patient to be seen on Monday 07/21/2020

## 2020-07-21 ENCOUNTER — Ambulatory Visit (INDEPENDENT_AMBULATORY_CARE_PROVIDER_SITE_OTHER): Payer: BLUE CROSS/BLUE SHIELD | Admitting: Internal Medicine

## 2020-07-21 ENCOUNTER — Encounter: Payer: Self-pay | Admitting: Internal Medicine

## 2020-07-21 ENCOUNTER — Other Ambulatory Visit: Payer: Self-pay

## 2020-07-21 VITALS — BP 164/100 | HR 62 | Ht 65.0 in | Wt 184.5 lb

## 2020-07-21 DIAGNOSIS — I10 Essential (primary) hypertension: Secondary | ICD-10-CM

## 2020-07-21 LAB — POTASSIUM: Potassium: 4.2 mEq/L (ref 3.5–5.1)

## 2020-07-21 NOTE — Progress Notes (Signed)
Name: Elizabeth [name]  MRN/ DOB: [id number], [birth year]    Age/ Sex: 59 y.o., female     PCP: Crecencio Mc, MD   Reason for Endocrinology Evaluation: Hyperthyroidism/ Hypercalcemia      Initial Endocrinology Clinic Visit: 09/06/2019    PATIENT IDENTIFIER: [name] is a 59 y.o., female with a past medical history of HTN, Hyperthyroidism and Hypercalcemia . She has followed with [facility] since 09/06/2019 for consultative assistance with management of her Hypercalcemia and hyperthyroidism.   HISTORICAL SUMMARY: The patient was first diagnosed with hypercalcemia many years ago.  Ca/Cr ratio 0.013 in 11/2019 which is inconclusive.  DXA 09/25/2019 - normal results.    THYROID HISTORY :  Has been diagnosed with hyperthyroidism in early 2000's. She has been on PTU since her diagnosis.  SUBJECTIVE:     Today (07/21/2020):  Elizabeth [name] is here for Hypertension. She called a few days ago stating metoprolol is causing shortness of breath. She was asked to stop it and present for a visit.  She has been on Azilsartan since 06/2020 She is currently on propranolol 1 tablet daily   Has an episode of vomiting and diarrhea after receiving a steroid injection last week .    Pt on Vitamin D3 1000 iu daily     Home readings of Bp  AM 160  Noon 130's /73   HISTORY:  Past Medical History:  Past Medical History:  Diagnosis Date  . Gout   . Hypertension   . Thyroid disease   . Vertigo    Past Surgical History:  Past Surgical History:  Procedure Laterality Date  . ENDOMETRIAL ABLATION  2010   Social History:  reports that she quit smoking about 21 years ago. She has never used smokeless tobacco. She reports previous alcohol use. She reports that she does not use drugs. Family History:  Family History  Problem Relation Age of Onset  . Hypertension Mother   . Heart disease Mother   . Heart failure Mother   . Hypertension Father      HOME  MEDICATIONS: Allergies as of 07/21/2020      Reactions   Codeine    Gabapentin Other (See Comments)   Drowsiness and felt bad overall   Tramadol Other (See Comments)   Made her a zombie      Medication List       Accurate as of July 21, 2020 11:37 AM. If you have any questions, ask your nurse or doctor.        Azilsartan Medoxomil 40 MG Tabs Take 1 tablet by mouth every morning.   cetirizine 10 MG tablet Commonly known as: ZYRTEC Take 1 tablet (10 mg total) by mouth at bedtime as needed for allergies.   colchicine 0.6 MG tablet Take 2 tablets by mouth at onset of gout flare, then in 1 hour take 1 tablet by mouth.   FLUTICASONE PROPIONATE NA Place into the nose.   gabapentin 300 MG capsule Commonly known as: NEURONTIN Take 300 mg by mouth daily as needed.   methocarbamol 500 MG tablet Commonly known as: Robaxin Take 1 tablet (500 mg total) by mouth 4 (four) times daily.   metoprolol succinate 50 MG 24 hr tablet Commonly known as: TOPROL-XL Take 1 tablet (50 mg total) by mouth daily. Take with or immediately following a meal.   PARoxetine 10 MG tablet Commonly known as: Paxil Take 0.5 tablets (5 mg total) by mouth daily. With dinner,  Increase to full  tablet after 4 days   propylthiouracil 50 MG tablet Commonly known as: PTU TAKE 1 TABLET(50 MG) BY MOUTH THREE TIMES DAILY   sodium chloride 0.65 % Soln nasal spray Commonly known as: OCEAN Place 2 sprays into both nostrils daily as needed for congestion. Before flonase 2 doses         OBJECTIVE:   PHYSICAL EXAM: VS: BP (!) 164/100   Pulse 62   Ht 5\' 5"  (1.651 m)   Wt 184 lb 8 oz (83.7 kg)   SpO2 98%   BMI 30.70 kg/m    EXAM: General: Pt appears well and is in NAD  Neck: General: Supple without adenopathy. Thyroid: Thyroid size normal.  No goiter or nodules appreciated. No thyroid bruit.  Lungs: Clear with good BS bilat with no rales, rhonchi, or wheezes  Heart: Auscultation: RRR.  Abdomen:  Normoactive bowel sounds, soft, nontender, without masses or organomegaly palpable  Extremities:  BL LE: No pretibial edema normal ROM and strength.  Skin: Hair: Texture and amount normal with gender appropriate distribution Skin Inspection: No rashes Skin Palpation: Skin temperature, texture, and thickness normal to palpation  Neuro: Cranial nerves: II - XII grossly intact  Motor: Normal strength throughout DTRs: 2+ and symmetric in UE without delay in relaxation phase     DATA REVIEWED:   7    Comment: .   Unable to flag abnormal result(s), please refer    to reference range(s) below:  .  Adult Reference Ranges for Aldosterone, LC/MS/MS:    Upright 8:00 - 10:00 am  < or = 28 ng/dL    Upright 4:00 - 6:00 pm  < or = 21 ng/dL    Supine  8:00 - 10:00 am    3 - 16 ng/dL  .   Renin Activity 0.25 - 5.82 ng/mL/h 0.21Low   ALDO / PRA Ratio 0.9 - 28.9 Ratio 33.3High        ASSESSMENT / PLAN / RECOMMENDATIONS:   1. HTN:    - Pt with SOB with metoprolol, she restarted propranolol without issues but has been taking it once a day.  - I am also going to recheck Aldo: renin ratio   Medication   - Stop Metoprolol  - Continue  Azilsartan 40 mg daily  - Increase Propranolol 20 mg to BID    F/U in 3 months     Signed electronically by: Mack Guise, MD  [facility]  [facility] [address] [place], [place] [address] Phone: [phone] FAX: [phone]      CC: Crecencio Mc, MD [place] [address] Phone: [phone]  Fax: [phone]   Return to Endocrinology clinic as below: Future Appointments  Date Time Provider [place]  10/16/2020  9:50 AM Zanna Hawn, Melanie Crazier, MD [id number] None

## 2020-07-21 NOTE — Patient Instructions (Addendum)
-   Continue Azilsartan daily  - INcrease Propranolol to 20 mg, twice daily

## 2020-07-22 ENCOUNTER — Encounter: Payer: Self-pay | Admitting: Internal Medicine

## 2020-07-25 LAB — ALDOSTERONE + RENIN ACTIVITY W/ RATIO
ALDOS/RENIN RATIO: 19.6 (ref 0.0–30.0)
ALDOSTERONE: 9.1 ng/dL (ref 0.0–30.0)
Renin: 0.464 ng/mL/hr (ref 0.167–5.380)

## 2020-07-28 MED ORDER — AZILSARTAN-CHLORTHALIDONE 40-25 MG PO TABS: 1.0000 | ORAL_TABLET | Freq: Every morning | ORAL | 0 refills | Status: DC

## 2020-07-28 NOTE — Addendum Note (Signed)
Addended by: Crecencio Mc on: 07/28/2020 04:29 PM   Modules accepted: Orders

## 2020-08-20 ENCOUNTER — Other Ambulatory Visit: Payer: Self-pay

## 2020-08-20 ENCOUNTER — Telehealth: Payer: Self-pay

## 2020-08-20 ENCOUNTER — Emergency Department: Payer: BLUE CROSS/BLUE SHIELD

## 2020-08-20 ENCOUNTER — Emergency Department
Admission: EM | Admit: 2020-08-20 | Discharge: 2020-08-20 | Disposition: A | Discharge status: Home / Self Care | Payer: BLUE CROSS/BLUE SHIELD | Attending: Emergency Medicine | Admitting: Emergency Medicine

## 2020-08-20 DIAGNOSIS — R030 Elevated blood-pressure reading, without diagnosis of hypertension: Secondary | ICD-10-CM | POA: Diagnosis not present

## 2020-08-20 DIAGNOSIS — I10 Essential (primary) hypertension: Secondary | ICD-10-CM | POA: Diagnosis not present

## 2020-08-20 DIAGNOSIS — R519 Headache, unspecified: Secondary | ICD-10-CM | POA: Diagnosis not present

## 2020-08-20 DIAGNOSIS — Z87891 Personal history of nicotine dependence: Secondary | ICD-10-CM | POA: Insufficient documentation

## 2020-08-20 DIAGNOSIS — Z79899 Other long term (current) drug therapy: Secondary | ICD-10-CM | POA: Insufficient documentation

## 2020-08-20 LAB — TROPONIN I (HIGH SENSITIVITY): Troponin I (High Sensitivity): 5 ng/L (ref <18)

## 2020-08-20 LAB — CBC
HCT: 40.1 % (ref 36.0–46.0)
Hemoglobin: 13.5 g/dL (ref 12.0–15.0)
MCH: 25.6 pg — ABNORMAL LOW (ref 26.0–34.0)
MCHC: 33.7 g/dL (ref 30.0–36.0)
MCV: 75.9 fL — ABNORMAL LOW (ref 80.0–100.0)
Platelets: 287 10*3/uL (ref 150–400)
RBC: 5.28 MIL/uL — ABNORMAL HIGH (ref 3.87–5.11)
RDW: 14.8 % (ref 11.5–15.5)
WBC: 6.2 10*3/uL (ref 4.0–10.5)
nRBC: 0 % (ref 0.0–0.2)

## 2020-08-20 LAB — BASIC METABOLIC PANEL
Anion gap: 7 (ref 5–15)
BUN: 14 mg/dL (ref 6–20)
CO2: 25 mmol/L (ref 22–32)
Calcium: 11 mg/dL — ABNORMAL HIGH (ref 8.9–10.3)
Chloride: 110 mmol/L (ref 98–111)
Creatinine, Ser: 1.09 mg/dL — ABNORMAL HIGH (ref 0.44–1.00)
GFR, Estimated: 58 mL/min — ABNORMAL LOW (ref >60)
Glucose, Bld: 94 mg/dL (ref 70–99)
Potassium: 4 mmol/L (ref 3.5–5.1)
Sodium: 142 mmol/L (ref 135–145)

## 2020-08-20 NOTE — Telephone Encounter (Signed)
Patient called office and said she is still waiting for a call from office. She also stated her BP just keeps going up.

## 2020-08-20 NOTE — Telephone Encounter (Signed)
Patient stated she has had BP medication  double Adarbies, propranolol BID. She stated her BP was 177/105 earlier today, right now it is 166/85 and yesterday 150+/85 all day. Patient did have headache three days ago and took tylenol for sx, seemed to help. She has had slight left pressure on the side of her head that wont go away. Patient was instructed to go to UC. She stated she will go to UC. She has No sx of lightheadedness, arm px, blurry vision, SOB, and chest px.

## 2020-08-20 NOTE — ED Provider Notes (Signed)
[facility] Emergency Department Provider Note  ____________________________________________   Event Date/Time   First MD Initiated Contact with Patient 08/20/20 1843     (approximate)  I have reviewed the triage vital signs and the nursing notes.   HISTORY  Chief Complaint Hypertension   HPI Elizabeth [name] is a 59 y.o. female with a past medical history of HTN, hypothyroidism, gout and vertigo who presents for assessment of some elevated blood pressure she notes at home in the setting of some dizziness and intermittent headache she has had over the last couple of weeks.  Patient states she thinks her blood pressure was slightly above 200 prior to arrival.  She states her headaches and intermittent dizziness have been on and off for several weeks but she does not currently have any on presentation emergency room.  She denies any vision changes, chest pain, cough, shortness of breath, abdominal pain, back pain, nausea, vomiting, diarrhea, dysuria, rash extremity pain no recent falls or injuries.  Denies EtOH use, illicit drug use or tobacco abuse.  States she is currently on Edarbi for her HTN but is not sure if she needs a higher dose or different medication at this time.         Past Medical History:  Diagnosis Date  . Gout   . Hypertension   . Thyroid disease   . Vertigo     Patient Active Problem List   Diagnosis Date Noted  . Breast mass, right 06/24/2020  . CTS (carpal tunnel syndrome) 06/24/2020  . Nausea 06/24/2020  . Primary hypertension 06/13/2020  . Generalized anxiety disorder 05/05/2020  . Abnormal auditory perception of both ears 04/03/2020  . Dizziness 04/03/2020  . Dysfunction of both eustachian tubes 01/03/2020  . Allergic rhinitis 01/03/2020  . Hyperparathyroidism ([place]) 12/10/2019  . Cervical myofascial pain syndrome 09/16/2019  . Hypercalcemia 08/22/2019  . Chronic left shoulder pain 08/22/2019  . Hyperlipidemia 03/19/2019  .  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 Essential hypertension 01/12/2019  . Hyperthyroidism 01/12/2019  . Neuropathy 01/12/2019    Past Surgical History:  Procedure Laterality Date  . ENDOMETRIAL ABLATION  2010    Prior to Admission medications   Medication Sig Start Date End Date Taking? Authorizing Provider  Azilsartan-Chlorthalidone 40-25 MG TABS Take 1 tablet by mouth every morning. 07/28/20   Crecencio Mc, MD  cetirizine (ZYRTEC) 10 MG tablet Take 1 tablet (10 mg total) by mouth at bedtime as needed for allergies. 01/02/20   McLean-Scocuzza, Nino Glow, MD  colchicine 0.6 MG tablet Take 2 tablets by mouth at onset of gout flare, then in 1 hour take 1 tablet by mouth. 01/12/19   Jodelle Green, FNP  FLUTICASONE PROPIONATE NA Place into the nose.    [provider]  gabapentin (NEURONTIN) 300 MG capsule Take 300 mg by mouth daily as needed. 09/20/18   [provider]  methocarbamol (ROBAXIN) 500 MG tablet Take 1 tablet (500 mg total) by mouth 4 (four) times daily. 08/22/19   Crecencio Mc, MD  PARoxetine (PAXIL) 10 MG tablet Take 0.5 tablets (5 mg total) by mouth daily. With dinner,  Increase to full tablet after 4 days 05/05/20   Crecencio Mc, MD  propranolol (INDERAL) 20 MG tablet Take 20 mg by mouth 2 (two) times daily.    [provider]  propylthiouracil (PTU) 50 MG tablet TAKE 1 TABLET(50 MG) BY MOUTH THREE TIMES DAILY 11/19/19   Leone Haven, MD  sodium chloride (  OCEAN) 0.65 % SOLN nasal spray Place 2 sprays into both nostrils daily as needed for congestion. Before flonase 2 doses 01/02/20   McLean-Scocuzza, Nino Glow, MD    Allergies Codeine, Gabapentin, Metoprolol, and Tramadol  Family History  Problem Relation Age of Onset  . Hypertension Mother   . Heart disease Mother   . Heart failure Mother   . Hypertension Father     Social History Social History   Tobacco Use  . Smoking status: Former Smoker    Quit date: 01/12/1999    Years  since quitting: 21.6  . Smokeless tobacco: Never Used  Vaping Use  . Vaping Use: Never used  Substance Use Topics  . Alcohol use: Not Currently    Comment: ocassionally (last over the holidays 2020)  . Drug use: Never    Review of Systems  Review of Systems  Constitutional: Negative for chills and fever.  HENT: Negative for sore throat.   Eyes: Negative for pain.  Respiratory: Negative for cough and stridor.   Cardiovascular: Negative for chest pain.  Gastrointestinal: Negative for vomiting.  Genitourinary: Negative for dysuria.  Musculoskeletal: Negative for myalgias.  Skin: Negative for rash.  Neurological: Positive for dizziness ( intermittently, not on presentation) and headaches ( intermittently, not on presentation ). Negative for seizures and loss of consciousness.  Psychiatric/Behavioral: Negative for suicidal ideas.  All other systems reviewed and are negative.     ____________________________________________   PHYSICAL EXAM:  VITAL SIGNS: ED Triage Vitals  Enc Vitals Group     BP 08/20/20 1540 (!) 203/87     Pulse Rate 08/20/20 1540 (!) 55     Resp 08/20/20 1540 20     Temp 08/20/20 1540 98.4 F (36.9 C)     Temp Source 08/20/20 1540 Oral     SpO2 08/20/20 1540 97 %     Weight 08/20/20 1541 184 lb (83.5 kg)     Height 08/20/20 1541 5\' 5"  (1.651 m)     Head Circumference --      Peak Flow --      Pain Score 08/20/20 1541 6     Pain Loc --      Pain Edu? --      Excl. in [place]? --    Vitals:   08/20/20 1824 08/20/20 1857  BP: (!) 177/86 (!) 177/86  Pulse: (!) 54 (!) 59  Resp: 20 20  Temp:  98.4 F (36.9 C)  SpO2: 98% 98%   Physical Exam Vitals and nursing note reviewed.  Constitutional:      General: She is not in acute distress.    Appearance: She is well-developed and well-nourished.  HENT:     Head: Normocephalic and atraumatic.     Right Ear: External ear normal.     Left Ear: External ear normal.     Nose: Nose normal.     Mouth/Throat:      Mouth: Mucous membranes are moist.  Eyes:     Conjunctiva/sclera: Conjunctivae normal.  Cardiovascular:     Rate and Rhythm: Normal rate and regular rhythm.     Heart sounds: No murmur heard.   Pulmonary:     Effort: Pulmonary effort is normal. No respiratory distress.     Breath sounds: Normal breath sounds.  Abdominal:     Palpations: Abdomen is soft.     Tenderness: There is no abdominal tenderness.  Musculoskeletal:        General: No edema.     Cervical back:  Neck supple.  Skin:    General: Skin is warm and dry.     Capillary Refill: Capillary refill takes less than 2 seconds.  Neurological:     Mental Status: She is alert and oriented to person, place, and time.  Psychiatric:        Mood and Affect: Mood and affect and mood normal.     Cranial nerves II through XII grossly intact.  No pronator drift.  No finger dysmetria.  Symmetric 5/5 strength of all extremities.  Sensation intact to light touch in all extremities.  Unremarkable unassisted gait.  ____________________________________________   LABS (all labs ordered are listed, but only abnormal results are displayed)  Labs Reviewed  BASIC METABOLIC PANEL - Abnormal; Notable for the following components:      Result Value   Creatinine, Ser 1.09 (*)    Calcium 11.0 (*)    GFR, Estimated 58 (*)    All other components within normal limits  CBC - Abnormal; Notable for the following components:   RBC 5.28 (*)    MCV 75.9 (*)    MCH 25.6 (*)    All other components within normal limits  TROPONIN I (HIGH SENSITIVITY)  TROPONIN I (HIGH SENSITIVITY)   ____________________________________________  EKG  Sinus bradycardia with ventricular rate of 59, normal axis, unremarkable intervals and nonspecific change in lead III and some artifact in V6 with no other evidence of acute ischemia. ________________________________________  RADIOLOGY  ED MD interpretation: No evidence of acute intracranial hemorrhage  subacute infarct or other acute intracranial process.  Official radiology report(s): CT Head Wo Contrast  Result Date: 08/20/2020 CLINICAL DATA:  Headache, intracranial hemorrhage suspected. Elevated blood pressure. EXAM: CT HEAD WITHOUT CONTRAST TECHNIQUE: Contiguous axial images were obtained from the base of the skull through the vertex without intravenous contrast. COMPARISON:  No pertinent prior exams available for comparison. FINDINGS: Brain: Parenchymal volume is normal for age. There is no acute intracranial hemorrhage. No demarcated cortical infarct. No extra-axial fluid collection. No evidence of intracranial mass. No midline shift. Vascular: No hyperdense vessel. Skull: No calvarial fracture or focal suspicious osseous lesion. Sinuses/Orbits: Visualized orbits show no acute finding. No significant paranasal sinus disease at the imaged levels. IMPRESSION: Unremarkable non-contrast CT appearance of the brain for age. No evidence of acute intracranial abnormality. Electronically Signed   By: Kellie Simmering DO   On: 08/20/2020 16:28    ____________________________________________   PROCEDURES  Procedure(s) performed (including Critical Care):  Procedures   ____________________________________________   INITIAL IMPRESSION / ASSESSMENT AND PLAN / ED COURSE      Patient presents above-stated exam for assessment of some elevated blood pressures that he saw at home as well as some intermittent headaches and dizziness over the last couple of weeks in the setting of elevated blood pressures.  On arrival patient is hypertensive with a BP of 203/87 although by the time I was able to examine patient had decreased to [id number] without any intervention.  What is possible patient has had some symptomatic hypertension related to headache and dizziness given she does not currently have any of the symptoms has a nonfocal neuro exam and normal CT have a low suspicion for CVA do not believe she requires  emergent lowering of her blood pressure at this time.  Her ECG does have a nonspecific change in V3 given she denies any chest pain and has a nonelevated troponin very low suspicion for ACS.  Presentation is not consistent with dissection.  CBC is  unremarkable.  BMP shows kidney function at baseline and some calcium at the upper limit of normal at 11 but no other significant derangements.  Patient is noted to have calcium to size 11.71-year ago.  Does not appear to be an acute change today.  Given otherwise improving blood pressure without intervention and patient denying any symptoms with nonfocal neuro exam and no evidence of acute organ injury on labs believe she is safe for discharge with plan for close outpatient PCP follow-up to further titrate her blood pressure medicines.  Discharged stable condition.  Strict return cautions advised and discussed.  ____________________________________________   FINAL CLINICAL IMPRESSION(S) / ED DIAGNOSES  Final diagnoses:  Hypertension, unspecified type    Medications - No data to display   ED Discharge Orders    None       Note:  This document was prepared using Dragon voice recognition software and may include unintentional dictation errors.   Lucrezia Starch, MD 08/20/20 2015

## 2020-08-20 NOTE — ED Triage Notes (Signed)
Pt to ED for HTN SBP 170's with headache and intermittent balance incoordination. Denies dizziness.  Reports compliance with medication.  Denies cp Pt in NAD Discussed pt with Dr Corky Downs

## 2020-08-20 NOTE — Telephone Encounter (Signed)
Pt said her blood pressure numbers are high and would like to talk to Dr. Derrel Nip about changing her medication. Pt did not give me readings just wants a call back.

## 2020-08-21 ENCOUNTER — Encounter: Payer: Self-pay | Admitting: Internal Medicine

## 2020-08-21 MED ORDER — AMLODIPINE BESYLATE 5 MG PO TABS: 5.0000 mg | ORAL_TABLET | Freq: Every day | ORAL | 3 refills | Status: DC

## 2020-08-21 NOTE — Telephone Encounter (Signed)
Pt stated that she had an old rx of her azilsartan that she started back on as well as the propranolol. Pt stated that since starting those she has noticed her bp coming back down. She wanted to let know that she did not want to start the amlodipine right now and that she would keep her appt with Dr. Olivia Mackie tomorrow.

## 2020-08-21 NOTE — Telephone Encounter (Signed)
Patient went to Urgent Care yesterday. They told her to follow up with her provider. No appointments available today. Bp at Urgent Care said her top number was 200.

## 2020-08-21 NOTE — Addendum Note (Signed)
Addended by: Crecencio Mc on: 08/21/2020 12:12 PM   Modules accepted: Orders

## 2020-08-21 NOTE — Telephone Encounter (Signed)
ADDING Amlodipine 5 mg daily to current regimen.  Continue previous medication as well.  If BP is not < 160 after  4 days of concurrent therapy,  Increase amlodipine to 10 mg daily and schedule a follow up with first available in office next week..   Confirm that she is NOT taking motrin , aleve or any other NSAIDS.  These can raise BP

## 2020-08-22 ENCOUNTER — Encounter: Payer: Self-pay | Admitting: Internal Medicine

## 2020-08-22 ENCOUNTER — Telehealth: Payer: Self-pay

## 2020-08-22 ENCOUNTER — Other Ambulatory Visit: Payer: Self-pay

## 2020-08-22 ENCOUNTER — Ambulatory Visit (INDEPENDENT_AMBULATORY_CARE_PROVIDER_SITE_OTHER): Payer: BLUE CROSS/BLUE SHIELD | Admitting: Internal Medicine

## 2020-08-22 VITALS — BP 148/100 | HR 63 | Temp 98.8°F | Ht 65.0 in | Wt 185.0 lb

## 2020-08-22 DIAGNOSIS — M542 Cervicalgia: Secondary | ICD-10-CM | POA: Diagnosis not present

## 2020-08-22 DIAGNOSIS — M25511 Pain in right shoulder: Secondary | ICD-10-CM

## 2020-08-22 DIAGNOSIS — E0590 Thyrotoxicosis, unspecified without thyrotoxic crisis or storm: Secondary | ICD-10-CM

## 2020-08-22 DIAGNOSIS — I10 Essential (primary) hypertension: Secondary | ICD-10-CM | POA: Diagnosis not present

## 2020-08-22 DIAGNOSIS — G8929 Other chronic pain: Secondary | ICD-10-CM

## 2020-08-22 MED ORDER — SPIRONOLACTONE 50 MG PO TABS: 25.0000 mg | ORAL_TABLET | Freq: Every day | ORAL | 0 refills | Status: DC

## 2020-08-22 MED ORDER — CYCLOBENZAPRINE HCL 5 MG PO TABS: 5.0000 mg | ORAL_TABLET | Freq: Every evening | ORAL | 2 refills | Status: DC | PRN

## 2020-08-22 MED ORDER — LOSARTAN POTASSIUM 100 MG PO TABS: 100.0000 mg | ORAL_TABLET | Freq: Every day | ORAL | 3 refills | Status: DC

## 2020-08-22 NOTE — Progress Notes (Signed)
Chief Complaint  Patient presents with  . Hypertension   ED f/u 08/20/20 [facility] uncontrolled HTN sbp 203/87 and repeat 177/86 w/o meds pt off hctz 25 mg ? Reason on inderal 20 mg bid and started taking losartan 100 mg qd x 2 days she had left sided h/a and was off balance feeling better now previously on edarbi hct 40-25 but too expensive and was on norvasc 10 mg and lasix 20 mg but no longer taking these and metoprolol caused sob in the past  Of not she does have h/o hyperthyroidism  Right shoulder back pain 6/10 she had steroid shot in right shoulder 07/09/20 with Dr. Demetrio Lapping ortho still in pain   Review of Systems  Constitutional: Negative for weight loss.  HENT: Negative for hearing loss.   Eyes: Negative for blurred vision.  Respiratory: Negative for shortness of breath.   Cardiovascular: Negative for chest pain.  Musculoskeletal: Positive for joint pain.  Skin: Negative for rash.  Neurological: Negative for headaches.       Balance ok     Past Medical History:  Diagnosis Date  . Gout   . Hypertension   . Thyroid disease   . Vertigo    Past Surgical History:  Procedure Laterality Date  . ENDOMETRIAL ABLATION  2010   Family History  Problem Relation Age of Onset  . Hypertension Mother   . Heart disease Mother   . Heart failure Mother   . Hypertension Father    Social History   Socioeconomic History  . Marital status: Married    Spouse name: Not on file  . Number of children: Not on file  . Years of education: Not on file  . Highest education level: Not on file  Occupational History  . Not on file  Tobacco Use  . Smoking status: Former Smoker    Quit date: 01/12/1999    Years since quitting: 21.6  . Smokeless tobacco: Never Used  Vaping Use  . Vaping Use: Never used  Substance and Sexual Activity  . Alcohol use: Not Currently    Comment: ocassionally (last over the holidays 2020)  . Drug use: Never  . Sexual activity: Not on file  Other Topics Concern  .  Not on file  Social History Narrative   Lives with husband Herbie Baltimore (also a pt Writer)   Right handed   Social Determinants of Health   Financial Resource Strain: Not on file  Food Insecurity: Not on file  Transportation Needs: Not on file  Physical Activity: Not on file  Stress: Not on file  Social Connections: Not on file  Intimate Partner Violence: Not on file   Current Meds  Medication Sig  . cetirizine (ZYRTEC) 10 MG tablet Take 1 tablet (10 mg total) by mouth at bedtime as needed for allergies.  . Cholecalciferol 25 MCG (1000 UT) tablet Take by mouth.  . colchicine 0.6 MG tablet Take 2 tablets by mouth at onset of gout flare, then in 1 hour take 1 tablet by mouth.  . diclofenac (VOLTAREN) 75 MG EC tablet   . ergocalciferol (VITAMIN D2) 1.25 MG (50000 UT) capsule   . FLUTICASONE PROPIONATE NA Place into the nose.  . gabapentin (NEURONTIN) 300 MG capsule Take 300 mg by mouth daily as needed.  . hydrOXYzine (ATARAX/VISTARIL) 10 MG tablet TAKE 1 TABLET BY MOUTH THREE TIMES DAILY AS NEEDED FOR ANXIETY. CAN CAUSE DROWSINESS.  Marland Kitchen PARoxetine (PAXIL) 10 MG tablet Take 0.5 tablets (5 mg total)  by mouth daily. With dinner,  Increase to full tablet after 4 days (Patient taking differently: Take 5 mg by mouth as needed. With dinner,  Increase to full tablet after 4 days)  . propranolol (INDERAL) 20 MG tablet Take 20 mg by mouth 2 (two) times daily.  Marland Kitchen propylthiouracil (PTU) 50 MG tablet TAKE 1 TABLET(50 MG) BY MOUTH THREE TIMES DAILY  . triamcinolone (KENALOG) 0.1 % APPLY TOPICALLY BID. USE THIN LAYER. NO MORE THAN 7 DAYS AT A TIME.   Allergies  Allergen Reactions  . Codeine   . Gabapentin Other (See Comments)    Drowsiness and felt bad overall  . Metoprolol     SOB  . Tramadol Other (See Comments)    Made her a zombie    Recent Results (from the past 2160 hour(s))  Aldosterone + renin activity w/ ratio     Status: Abnormal   Collection Time: 06/13/20 10:15 AM   Result Value Ref Range   Aldosterone 7  ng/dL    Comment: .  Unable to flag abnormal result(s), please refer     to reference range(s) below: . Adult Reference Ranges for Aldosterone, LC/MS/MS:     Upright  8:00 - 10:00 am    < or = 28 ng/dL     Upright  4:00 -  6:00 pm    < or = 21 ng/dL     Supine   8:00 - 10:00 am       3 - 16 ng/dL .    Renin Activity 0.21 (L) 0.25 - 5.82 ng/mL/h   ALDO / PRA Ratio 33.3 (H) 0.9 - 28.9 Ratio    Comment: . This test was developed and its analytical performance characteristics have been determined by [place], [place]. It has not been cleared or approved by the U.S. Food and Drug Administration. This assay has been validated pursuant to the CLIA regulations and is used for clinical purposes. .   Parathyroid hormone, intact (no Ca)     Status: Abnormal   Collection Time: 06/13/20 10:15 AM  Result Value Ref Range   PTH 120 (H) 14 - 64 pg/mL    Comment: . Interpretive Guide    Intact PTH           Calcium ------------------    ----------           ------- Normal Parathyroid    Normal               Normal Hypoparathyroidism    Low or Low Normal    Low Hyperparathyroidism    Primary            Normal or High       High    Secondary          High                 Normal or Low    Tertiary           High                 High Non-Parathyroid    Hypercalcemia      Low or Low Normal    High .   TSH     Status: None   Collection Time: 06/13/20 10:15 AM  Result Value Ref Range   TSH 1.77 0.35 - 4.50 uIU/mL  T4, free     Status: None   Collection Time: 06/13/20 10:15 AM  Result  Value Ref Range   Free T4 0.78 0.60 - 1.60 ng/dL    Comment: Specimens from patients who are undergoing biotin therapy and /or ingesting biotin supplements may contain high levels of biotin.  The higher biotin concentration in these specimens interferes with this Free T4 assay.  Specimens that contain high levels  of biotin may cause false  high results for this Free T4 assay.  Please interpret results in light of the total clinical presentation of the patient.    VITAMIN D 25 Hydroxy (Vit-D Deficiency, Fractures)     Status: Abnormal   Collection Time: 06/13/20 10:15 AM  Result Value Ref Range   VITD 25.35 (L) 30.00 - 100.00 ng/mL  Comprehensive metabolic panel     Status: Abnormal   Collection Time: 06/13/20 10:15 AM  Result Value Ref Range   Sodium 137 135 - 145 mEq/L   Potassium 3.8 3.5 - 5.1 mEq/L   Chloride 106 96 - 112 mEq/L   CO2 25 19 - 32 mEq/L   Glucose, Bld 102 (H) 70 - 99 mg/dL   BUN 16 6 - 23 mg/dL   Creatinine, Ser 1.04 0.40 - 1.20 mg/dL   Total Bilirubin 0.5 0.2 - 1.2 mg/dL   Alkaline Phosphatase 106 39 - 117 U/L   AST 19 0 - 37 U/L   ALT 19 0 - 35 U/L   Total Protein 7.6 6.0 - 8.3 g/dL   Albumin 4.0 3.5 - 5.2 g/dL   GFR 58.27 (L) >60.00 mL/min   Calcium 10.8 (H) 8.4 - 10.5 mg/dL  Potassium     Status: None   Collection Time: 07/21/20 12:05 PM  Result Value Ref Range   Potassium 4.2 3.5 - 5.1 mEq/L  Aldosterone + renin activity w/ ratio     Status: None   Collection Time: 07/21/20 12:05 PM  Result Value Ref Range   ALDOSTERONE 9.1 0.0 - 30.0 ng/dL   Renin 0.464 0.167 - 5.380 ng/mL/hr   ALDOS/RENIN RATIO 19.6 0.0 - 30.0    Comment:                          Units:      ng/dL per ng/mL/hr  Basic metabolic panel     Status: Abnormal   Collection Time: 08/20/20  3:52 PM  Result Value Ref Range   Sodium 142 135 - 145 mmol/L   Potassium 4.0 3.5 - 5.1 mmol/L   Chloride 110 98 - 111 mmol/L   CO2 25 22 - 32 mmol/L   Glucose, Bld 94 70 - 99 mg/dL    Comment: Glucose reference range applies only to samples taken after fasting for at least 8 hours.   BUN 14 6 - 20 mg/dL   Creatinine, Ser 1.09 (H) 0.44 - 1.00 mg/dL   Calcium 11.0 (H) 8.9 - 10.3 mg/dL   GFR, Estimated 58 (L) >60 mL/min    Comment: (NOTE) Calculated using the CKD-EPI Creatinine Equation (2021)    Anion gap 7 5 - 15    Comment:  Performed at [facility], [address] [place], [place] [address]  CBC     Status: Abnormal   Collection Time: 08/20/20  3:52 PM  Result Value Ref Range   WBC 6.2 4.0 - 10.5 K/uL   RBC 5.28 (H) 3.87 - 5.11 MIL/uL   Hemoglobin 13.5 12.0 - 15.0 g/dL   HCT 40.1 36.0 - 46.0 %   MCV 75.9 (L) 80.0 -  100.0 fL   MCH 25.6 (L) 26.0 - 34.0 pg   MCHC 33.7 30.0 - 36.0 g/dL   RDW 14.8 11.5 - 15.5 %   Platelets 287 150 - 400 K/uL   nRBC 0.0 0.0 - 0.2 %    Comment: Performed at [facility], [address] [place], [place] [address]  Troponin I (High Sensitivity)     Status: None   Collection Time: 08/20/20  3:52 PM  Result Value Ref Range   Troponin I (High Sensitivity) 5 <18 ng/L    Comment: (NOTE) Elevated high sensitivity troponin I (hsTnI) values and significant  changes across serial measurements may suggest ACS but many other  chronic and acute conditions are known to elevate hsTnI results.  Refer to the "Links" section for chest pain algorithms and additional  guidance. Performed at [facility], [address] [place], [place] [address]    Objective  Body mass index is 30.79 kg/m. Wt Readings from Last 3 Encounters:  08/22/20 185 lb (83.9 kg)  08/20/20 184 lb (83.5 kg)  07/21/20 184 lb 8 oz (83.7 kg)   Temp Readings from Last 3 Encounters:  08/22/20 98.8 F (37.1 C) (Oral)  08/20/20 98.4 F (36.9 C) (Oral)  06/23/20 98.4 F (36.9 C) (Oral)   BP Readings from Last 3 Encounters:  08/22/20 (!) 148/100  08/20/20 (!) 177/86  07/21/20 (!) 164/100   Pulse Readings from Last 3 Encounters:  08/22/20 63  08/20/20 (!) 59  07/21/20 62    Physical Exam Vitals and nursing note reviewed.  Constitutional:      Appearance: Normal appearance. She is well-developed and well-groomed. She is obese.  HENT:     Head: Normocephalic and atraumatic.  Eyes:     Conjunctiva/sclera: Conjunctivae normal.     Pupils: Pupils are equal, round, and reactive  to light.  Cardiovascular:     Rate and Rhythm: Normal rate and regular rhythm.     Heart sounds: Normal heart sounds. No murmur heard.   Pulmonary:     Effort: Pulmonary effort is normal.     Breath sounds: Normal breath sounds.  Skin:    General: Skin is warm and dry.  Neurological:     General: No focal deficit present.     Mental Status: She is alert and oriented to person, place, and time. Mental status is at baseline.     Gait: Gait normal.  Psychiatric:        Attention and Perception: Attention and perception normal.        Mood and Affect: Mood and affect normal.        Speech: Speech normal.        Behavior: Behavior normal. Behavior is cooperative.        Thought Content: Thought content normal.        Cognition and Memory: Cognition and memory normal.        Judgment: Judgment normal.     Assessment  Plan  Hypertension,with resistant HTN with h/o hyperthyroidism - Plan: losartan (COZAAR) 100 MG tablet, spironolactone (ALDACTONE) 1/2 pill x 3 days then increase day 4 to 50 MG tablet, Cont inderal 20 mg bid  US Renal Artery Stenosis r/o RAS  F/u with PCP BP and other HM Cost of edarbi hct too high and no longer on norvasc 10 mg due to bradycardia with BB I believe and no longer on lasix 20 with h/o hypoK will add spironolactone   Cervicalgia - Plan: cyclobenzaprine (FLEXERIL)  5 MG tablet Chronic right shoulder pain  S/p steroid injection 07/09/20 Dr. Rudene Christians suspected rotator cuff d/o right     Provider: Dr. Olivia Mackie McLean-Scocuzza-Internal Medicine

## 2020-08-22 NOTE — Patient Instructions (Addendum)
1/2 pill of spironolactone (prefer take in the am but go ahead and start tonight) If your BP good continue with 1/2 pill dose  If BP high >130/>80 take 1 pill 50 mg spirolactone daily in the am starting in 4 days   Monitor BP goal <130/<80   Spironolactone Oral Tablets What is this medicine? SPIRONOLACTONE ([place] LAK tone) is a diuretic. It helps you make more urine and to lose excess water from your body. This medicine is used to treat high blood pressure, and edema or swelling from heart, kidney, or liver disease. It is also used to treat patients who make too much aldosterone or have low potassium. This medicine may be used for other purposes; ask your health care provider or pharmacist if you have questions. COMMON BRAND NAME(S): Aldactone What should I tell my health care provider before I take this medicine? They need to know if you have any of these conditions:  high blood level of potassium  kidney disease or trouble making urine  liver disease  an unusual or allergic reaction to spironolactone, other medicines, foods, dyes, or preservatives  pregnant or trying to get pregnant  breast-feeding How should I use this medicine? Take this drug by mouth. Take it as directed on the prescription label at the same time every day. You can take it with or without food. You should always take it the same way. Keep taking it unless your health care provider tells you to stop. Talk to your health care provider about the use of this drug in children. Special care may be needed. Overdosage: If you think you have taken too much of this medicine contact a poison control center or emergency room at once. NOTE: This medicine is only for you. Do not share this medicine with others. What if I miss a dose? If you miss a dose, take it as soon as you can. If it is almost time for your next dose, take only that dose. Do not take double or extra doses. What may interact with this medicine? Do not  take this medicine with any of the following medications:  cidofovir  eplerenone  tranylcypromine This medicine may also interact with the following medications:  aspirin  certain medicines for blood pressure or heart disease like benazepril, lisinopril, losartan, valsartan  certain medicines that treat or prevent blood clots like heparin and enoxaparin  cholestyramine  cyclosporine  digoxin  lithium  medicines that relax muscles for surgery  NSAIDs, medicines for pain and inflammation, like ibuprofen or naproxen  other diuretics  potassium supplements  steroid medicines like prednisone or cortisone  trimethoprim This list may not describe all possible interactions. Give your health care provider a list of all the medicines, herbs, non-prescription drugs, or dietary supplements you use. Also tell them if you smoke, drink alcohol, or use illegal drugs. Some items may interact with your medicine. What should I watch for while using this medicine? Visit your doctor or health care professional for regular checks on your progress. Check your blood pressure as directed. Ask your doctor what your blood pressure should be, and when you should contact them. You may need to be on a special diet while taking this medicine. Ask your doctor. Also, ask how many glasses of fluid you need to drink a day. You must not get dehydrated. This medicine may make you feel confused, dizzy or lightheaded. Drinking alcohol and taking some medicines can make this worse. Do not drive, use machinery, or  do anything that needs mental alertness until you know how this medicine affects you. Do not sit or stand up quickly. What side effects may I notice from receiving this medicine? Side effects that you should report to your doctor or health care professional as soon as possible:  allergic reactions such as skin rash or itching, hives, swelling of the lips, mouth, tongue, or throat  black or tarry  stools  fast, irregular heartbeat  fever  muscle pain, cramps  numbness, tingling in hands or feet  trouble breathing  trouble passing urine  unusual bleeding  unusually weak or tired Side effects that usually do not require medical attention (report to your doctor or health care professional if they continue or are bothersome):  change in voice or hair growth  confusion  dizzy, drowsy  dry mouth, increased thirst  enlarged or tender breasts  headache  irregular menstrual periods  sexual difficulty, unable to have an erection  stomach upset This list may not describe all possible side effects. Call your doctor for medical advice about side effects. You may report side effects to FDA at 1-800-FDA-1088. Where should I keep my medicine? Keep out of the reach of children and pets. Store at room temperature between 20 and 25 degrees C (68 and 77 degrees F). Throw away any unused drug after the expiration date. NOTE: This sheet is a summary. It may not cover all possible information. If you have questions about this medicine, talk to your doctor, pharmacist, or health care provider.  2020 Elsevier/Gold Standard (2019-04-17 12:38:34)

## 2020-08-22 NOTE — Telephone Encounter (Signed)
Per Dr TMS-pt needs appt for HTN f/u within 1-2 weeks. No appts avail with PCP at time of message. Virtual covid appts only. Please advise.

## 2020-08-25 NOTE — Telephone Encounter (Signed)
Spoke with pt and scehduled her an appt. Pt is aware of appt date and time.

## 2020-08-25 NOTE — Telephone Encounter (Signed)
YES

## 2020-08-25 NOTE — Telephone Encounter (Signed)
Is it okay to use one of the covid appts?

## 2020-08-27 DIAGNOSIS — Z0271 Encounter for disability determination: Secondary | ICD-10-CM

## 2020-08-31 DIAGNOSIS — Z20822 Contact with and (suspected) exposure to covid-19: Secondary | ICD-10-CM | POA: Diagnosis not present

## 2020-09-04 ENCOUNTER — Telehealth (INDEPENDENT_AMBULATORY_CARE_PROVIDER_SITE_OTHER): Payer: BLUE CROSS/BLUE SHIELD | Admitting: Internal Medicine

## 2020-09-04 ENCOUNTER — Encounter: Payer: Self-pay | Admitting: Internal Medicine

## 2020-09-04 DIAGNOSIS — U071 COVID-19: Secondary | ICD-10-CM | POA: Diagnosis not present

## 2020-09-04 DIAGNOSIS — Z8616 Personal history of COVID-19: Secondary | ICD-10-CM | POA: Insufficient documentation

## 2020-09-04 NOTE — Patient Instructions (Signed)
i'M GLAD YOU ARE FEELING BETTER !!  You should continue isolating for a minimum of 7 days from the day you developed symptoms (Dec 26)   and you must be fever free without the use of tylenol, aleve or motrin for 24 hours, and all symptoms need to be improving  before breaking isolation  and/or returning to work,   PLEASE do not use MYCHART OVER THE WEEKEND FOR WORSENING SYMPTOMS.  CALL THE OFFICE AND THE ANSWERING SERVICE WILL PLACE YOU IN CONTACT WITH THE DOCTOR ON CALL.

## 2020-09-04 NOTE — Progress Notes (Signed)
Virtual Visit CONVERTED TO TELEPHONE   Note  This visit type was conducted due to national recommendations for restrictions regarding the COVID-19 pandemic (e.g. social distancing).  This format is felt to be most appropriate for this patient at this time.  All issues noted in this document were discussed and addressed.  No physical exam was performed (except for noted visual exam findings with Video Visits).   I attempted to connect with@ on 09/04/20 at 11:30 AM EST by a video enabled telemedicine applicationand verified that I am speaking with the correct person using two identifiers. Location patient: home Location provider: work or home office Persons participating in the virtual visit: patient, provider  I discussed the limitations, risks, security and privacy concerns of performing an evaluation and management service by telephone and the availability of in person appointments. I also discussed with the patient that there may be a patient responsible charge related to this service. The patient expressed understanding and agreed to proceed.  Interactive audio and video telecommunications were attempted between this provider and patient, however failed, due to patient having technical difficulties .  We continued and completed visit with audio only.   Reason for visit: COVID 19 INFECTION   HPI:  59 YR OLD VACCINATED FEMALE PRESENTS WITH IMPROVING SYMPTOMS OF DOCUMENTED COVID INFECTION.  Developed symptoms of cough,  Congestion,  Body aches and fever on Dec 26.  Tested at Urgent Care and positive.  All symptoms are improving.  She denies shortness of breath . Continues to have nasal congestion without facial pain.  Using mucinex, advil and sudafed.  No headaches.   ROS: See pertinent positives and negatives per HPI.  Past Medical History:  Diagnosis Date  . Gout   . Hypertension   . Thyroid disease   . Vertigo     Past Surgical History:  Procedure Laterality Date  . ENDOMETRIAL  ABLATION  2010    Family History  Problem Relation Age of Onset  . Hypertension Mother   . Heart disease Mother   . Heart failure Mother   . Hypertension Father     SOCIAL HX:  reports that she quit smoking about 21 years ago. She has never used smokeless tobacco. She reports previous alcohol use. She reports that she does not use drugs.   Current Outpatient Medications:  .  cetirizine (ZYRTEC) 10 MG tablet, Take 1 tablet (10 mg total) by mouth at bedtime as needed for allergies., Disp: 90 tablet, Rfl: 3 .  Cholecalciferol 25 MCG (1000 UT) tablet, Take by mouth., Disp: , Rfl:  .  colchicine 0.6 MG tablet, Take 2 tablets by mouth at onset of gout flare, then in 1 hour take 1 tablet by mouth., Disp: 3 tablet, Rfl: 0 .  cyclobenzaprine (FLEXERIL) 5 MG tablet, Take 1 tablet (5 mg total) by mouth at bedtime as needed for muscle spasms., Disp: 30 tablet, Rfl: 2 .  diclofenac (VOLTAREN) 75 MG EC tablet, , Disp: , Rfl:  .  ergocalciferol (VITAMIN D2) 1.25 MG (50000 UT) capsule, , Disp: , Rfl:  .  FLUTICASONE PROPIONATE NA, Place into the nose., Disp: , Rfl:  .  gabapentin (NEURONTIN) 300 MG capsule, Take 300 mg by mouth daily as needed., Disp: , Rfl:  .  losartan (COZAAR) 100 MG tablet, Take 1 tablet (100 mg total) by mouth daily., Disp: 90 tablet, Rfl: 3 .  PARoxetine (PAXIL) 10 MG tablet, Take 0.5 tablets (5 mg total) by mouth daily. With dinner,  Increase to full  tablet after 4 days (Patient taking differently: Take 5 mg by mouth every other day. With dinner,  Increase to full tablet after 4 days), Disp: 30 tablet, Rfl: 2 .  propranolol (INDERAL) 20 MG tablet, Take 20 mg by mouth 2 (two) times daily., Disp: , Rfl:  .  propylthiouracil (PTU) 50 MG tablet, TAKE 1 TABLET(50 MG) BY MOUTH THREE TIMES DAILY, Disp: 270 tablet, Rfl: 0 .  spironolactone (ALDACTONE) 50 MG tablet, Take 0.5 tablets (25 mg total) by mouth daily. X 3 days and increase to 1 pill if BP >130/>80 day 4 in the am, Disp: 90  tablet, Rfl: 0 .  triamcinolone (KENALOG) 0.1 %, APPLY TOPICALLY BID. USE THIN LAYER. NO MORE THAN 7 DAYS AT A TIME., Disp: , Rfl:   EXAM:  General impression: alert, cooperative and articulate.  No signs of being in distress  Lungs: speech is fluent sentence length suggests that patient is not short of breath and not punctuated by cough, sneezing or sniffing. Marland Kitchen   Psych: affect normal.  speech is articulate and non pressured .  Denies suicidal thoughts   ASSESSMENT AND PLAN:  Discussed the following assessment and plan:  COVID-19 virus infection  COVID-19 virus infection SYMPTOM ONSET  Dec 26.  Tested positive.  Symptoms are all improving and mild.  Advised to resume Flonase and add Afrin bid for nasal congestion.  .patient advised to remain in isolation until Sunday , provided she is afebrile for 24 hours without use of Motrin and all symptoms are improving    I discussed the assessment and treatment plan with the patient. The patient was provided an opportunity to ask questions and all were answered. The patient agreed with the plan and demonstrated an understanding of the instructions.   The patient was advised to call back or seek an in-person evaluation if the symptoms worsen or if the condition fails to improve as anticipated.  I provided 20 minutes of non-face-to-face time during this encounter.   Crecencio Mc, MD

## 2020-09-04 NOTE — Assessment & Plan Note (Signed)
SYMPTOM ONSET  Dec 26.  Tested positive.  Symptoms are all improving and mild.  Advised to resume Flonase and add Afrin bid for nasal congestion.  .patient advised to remain in isolation until Sunday , provided she is afebrile for 24 hours without use of Motrin and all symptoms are improving

## 2020-09-18 ENCOUNTER — Other Ambulatory Visit: Payer: Self-pay | Admitting: Family Medicine

## 2020-09-18 ENCOUNTER — Telehealth: Payer: Self-pay

## 2020-09-18 NOTE — Addendum Note (Signed)
Addended by: Orland Mustard on: 09/18/2020 04:57 PM   Modules accepted: Orders

## 2020-09-18 NOTE — Telephone Encounter (Signed)
-----   Message from Elizabeth [name] sent at 09/18/2020 11:38 AM EST ----- Regarding: New order Good morning!  I called pt to sch US Renal Artery Stenosis the order that there is expired. Need new dated order please and thank you!

## 2020-09-18 NOTE — Telephone Encounter (Addendum)
Are you okay if I reorder this [place]?

## 2020-09-21 ENCOUNTER — Other Ambulatory Visit: Payer: Self-pay | Admitting: Internal Medicine

## 2020-09-21 DIAGNOSIS — I10 Essential (primary) hypertension: Secondary | ICD-10-CM

## 2020-10-14 ENCOUNTER — Other Ambulatory Visit: Payer: Self-pay

## 2020-10-15 ENCOUNTER — Other Ambulatory Visit: Payer: Self-pay | Admitting: Internal Medicine

## 2020-10-15 DIAGNOSIS — I10 Essential (primary) hypertension: Secondary | ICD-10-CM

## 2020-10-16 ENCOUNTER — Ambulatory Visit (INDEPENDENT_AMBULATORY_CARE_PROVIDER_SITE_OTHER): Payer: BLUE CROSS/BLUE SHIELD | Admitting: Internal Medicine

## 2020-10-16 ENCOUNTER — Other Ambulatory Visit: Payer: Self-pay

## 2020-10-16 ENCOUNTER — Encounter: Payer: Self-pay | Admitting: Internal Medicine

## 2020-10-16 VITALS — BP 130/86 | HR 60 | Ht 65.0 in | Wt 181.4 lb

## 2020-10-16 DIAGNOSIS — E0590 Thyrotoxicosis, unspecified without thyrotoxic crisis or storm: Secondary | ICD-10-CM | POA: Diagnosis not present

## 2020-10-16 DIAGNOSIS — E213 Hyperparathyroidism, unspecified: Secondary | ICD-10-CM

## 2020-10-16 LAB — BASIC METABOLIC PANEL
BUN: 20 mg/dL (ref 6–23)
CO2: 27 mEq/L (ref 19–32)
Calcium: 11.7 mg/dL — ABNORMAL HIGH (ref 8.4–10.5)
Chloride: 105 mEq/L (ref 96–112)
Creatinine, Ser: 1.19 mg/dL (ref 0.40–1.20)
GFR: 49.69 mL/min — ABNORMAL LOW (ref >60.00)
Glucose, Bld: 96 mg/dL (ref 70–99)
Potassium: 4.1 mEq/L (ref 3.5–5.1)
Sodium: 138 mEq/L (ref 135–145)

## 2020-10-16 LAB — ALBUMIN: Albumin: 4 g/dL (ref 3.5–5.2)

## 2020-10-16 LAB — T4, FREE: Free T4: 1.49 ng/dL (ref 0.60–1.60)

## 2020-10-16 LAB — TSH: TSH: 1.74 u[IU]/mL (ref 0.35–4.50)

## 2020-10-16 LAB — VITAMIN D 25 HYDROXY (VIT D DEFICIENCY, FRACTURES): VITD: 26.45 ng/mL — ABNORMAL LOW (ref 30.00–100.00)

## 2020-10-16 NOTE — Progress Notes (Addendum)
Name: Elizabeth [name]  MRN/ DOB: [id number], [birth year]    Age/ Sex: 60 y.o., female     PCP: Crecencio Mc, MD   Reason for Endocrinology Evaluation: Hyperthyroidism/ Hypercalcemia      Initial Endocrinology Clinic Visit: 09/06/2019    PATIENT IDENTIFIER: [name] is a 60 y.o., female with a past medical history of HTN, Hyperthyroidism and Hypercalcemia . She has followed with [facility] since 09/06/2019 for consultative assistance with management of her Hypercalcemia and hyperthyroidism.   HISTORICAL SUMMARY: The patient was first diagnosed with hypercalcemia many years ago.  Ca/Cr ratio 0.013 in 11/2019 which is inconclusive.  DXA 09/25/2019 - normal results.    THYROID HISTORY :  Has been diagnosed with hyperthyroidism in early 2000's. She has been on PTU since her diagnosis.    HTN: Renin was normal 0.464 ng/Ml/hr with normal aldosterone level at 9.1 ng/dL  In 07/2020. Prior to that she had inconclusive testing but confirmed normal by 07/2020 with normal serum potassium.   SUBJECTIVE:     Today (10/16/2020):  Ms. [name] is here for Hypercalcemia and hyperthyroidism.    Denies polyuria or polydipsia  Denies fever, constipation or diarrhea    Vitamin D3 2000 iu daily - not taking  PTU 50 mg daily      HISTORY:  Past Medical History:  Past Medical History:  Diagnosis Date  . Gout   . Hypertension   . Thyroid disease   . Vertigo    Past Surgical History:  Past Surgical History:  Procedure Laterality Date  . ENDOMETRIAL ABLATION  2010   Social History:  reports that she quit smoking about 21 years ago. She has never used smokeless tobacco. She reports previous alcohol use. She reports that she does not use drugs. Family History:  Family History  Problem Relation Age of Onset  . Hypertension Mother   . Heart disease Mother   . Heart failure Mother   . Hypertension Father      HOME MEDICATIONS: Allergies as of 10/16/2020       Reactions   Codeine    Gabapentin Other (See Comments)   Drowsiness and felt bad overall   Metoprolol    SOB   Tramadol Other (See Comments)   Made her a zombie      Medication List       Accurate as of October 16, 2020  2:09 PM. If you have any questions, ask your nurse or doctor.        cetirizine 10 MG tablet Commonly known as: ZYRTEC Take 1 tablet (10 mg total) by mouth at bedtime as needed for allergies.   Cholecalciferol 25 MCG (1000 UT) tablet Take by mouth.   colchicine 0.6 MG tablet Take 2 tablets by mouth at onset of gout flare, then in 1 hour take 1 tablet by mouth.   cyclobenzaprine 5 MG tablet Commonly known as: FLEXERIL Take 1 tablet (5 mg total) by mouth at bedtime as needed for muscle spasms.   diclofenac 75 MG EC tablet Commonly known as: VOLTAREN   ergocalciferol 1.25 MG (50000 UT) capsule Commonly known as: VITAMIN D2   FLUTICASONE PROPIONATE NA Place into the nose.   gabapentin 300 MG capsule Commonly known as: NEURONTIN Take 300 mg by mouth daily as needed.   losartan 100 MG tablet Commonly known as: COZAAR Take 1 tablet (100 mg total) by mouth daily.   PARoxetine 10 MG tablet Commonly known as: Paxil Take 0.5 tablets (5 mg  total) by mouth daily. With dinner,  Increase to full tablet after 4 days What changed: when to take this   propranolol 20 MG tablet Commonly known as: INDERAL TAKE 1 TABLET(20 MG) BY MOUTH THREE TIMES DAILY   propylthiouracil 50 MG tablet Commonly known as: PTU TAKE 1 TABLET(50 MG) BY MOUTH THREE TIMES DAILY   spironolactone 50 MG tablet Commonly known as: ALDACTONE TAKE 1/2 TABLET BY MOUTH DAILY FOR 3 DAYS, THEN INCREASE TO 1 TABLET IF BLOOD PRESSURE>130/80 DAY 4 IN THE AM   triamcinolone 0.1 % Commonly known as: KENALOG APPLY TOPICALLY BID. USE THIN LAYER. NO MORE THAN 7 DAYS AT A TIME.         OBJECTIVE:   PHYSICAL EXAM: VS: BP 130/86   Pulse 60   Ht 5\' 5"  (1.651 m)   Wt 181 lb 6 oz  (82.3 kg)   SpO2 98%   BMI 30.18 kg/m    EXAM: General: Pt appears well and is in NAD  Neck: General: Supple without adenopathy. Thyroid: Thyroid size normal.  No goiter or nodules appreciated.   Lungs: Clear with good BS bilat with no rales, rhonchi, or wheezes  Heart: Auscultation: RRR.  Abdomen: Normoactive bowel sounds, soft, nontender, without masses or organomegaly palpable  Extremities:  BL LE: No pretibial edema normal ROM and strength.  Neuro: Cranial nerves: II - XII grossly intact  Motor: Normal strength throughout DTRs: 2+ and symmetric in UE without delay in relaxation phase     DATA REVIEWED:  Results for CHEYANNE, LAMISON (MRN [id number]) as of 10/17/2020 12:27  Ref. Range 10/16/2020 10:33  Sodium Latest Ref Range: 135 - 145 mEq/L 138  Potassium Latest Ref Range: 3.5 - 5.1 mEq/L 4.1  Chloride Latest Ref Range: 96 - 112 mEq/L 105  CO2 Latest Ref Range: 19 - 32 mEq/L 27  Glucose Latest Ref Range: 70 - 99 mg/dL 96  BUN Latest Ref Range: 6 - 23 mg/dL 20  Creatinine Latest Ref Range: 0.40 - 1.20 mg/dL 1.19  Calcium Latest Ref Range: 8.4 - 10.5 mg/dL 11.7 (H)  Albumin Latest Ref Range: 3.5 - 5.2 g/dL 4.0  GFR Latest Ref Range: >60.00 mL/min 49.69 (L)  VITD Latest Ref Range: 30.00 - 100.00 ng/mL 26.45 (L)  PTH, Intact Latest Ref Range: 14 - 64 pg/mL 96 (H)  TSH Latest Ref Range: 0.35 - 4.50 uIU/mL 1.74  T4,Free(Direct) Latest Ref Range: 0.60 - 1.60 ng/dL 1.49     ASSESSMENT / PLAN / RECOMMENDATIONS:   Hyperthyroidism:   - Clinically she is euthyroid  - D/D graves' disease vs toxic thyroid nodule (s) - We had discussed risks/benefits of PTU vs Methimazole but she opted to stay with PTU - Will proceed with thyroid ultrasound   Medications : Continue PTU 50 mg daily    2. Hyperparathyroidism:  - Pt asymptomatic - 24- hr urine  Ca/Cr ratio 0.013 in 11/2019 which is inconclusive, but most likely Primary Hyperparathyroidism - DXA normal 09/2019 - She was  encouraged to stay hydrated, avoid OTC calcium tablets but to consume 2-3 servings of calcium daily.  - She was advised to restart Vitamin D  - Given elevated serum calcium at 11.7 mg/dL and deteriorating GFR, will proceed with surgical evaluation.      F/U in 3 months   Addendum: Discussed referral for parathyroidectomy 10/17/2020 at 1630  Signed electronically by: Mack Guise, MD  [facility]  [address] [place], [place] [address]  Phone: [phone] FAX: [phone]      CC: Crecencio Mc, MD [address] [place] [address] Phone: [phone]  Fax: [phone]   Return to Endocrinology clinic as below: Future Appointments  Date Time Provider Philip  10/17/2020  8:45 AM [facility]  01/14/2021  9:30 AM Aydrien Froman, Melanie Crazier, MD [facility] None

## 2020-10-16 NOTE — Patient Instructions (Addendum)
-   Stay hydrated - Avoid over the counter calcium  - Consume 2-3 servings of dairy daily in your diet  - Continue PTU 50 mg daily       24-Hour Urine Collection   You will be collecting your urine for a 24-hour period of time.  Your timer starts with your first urine of the morning (For example - If you first pee at [place], your timer will start at [place])  [place] away your first urine of the morning  Collect your urine every time you pee for the next 24 hours STOP your urine collection 24 hours after you started the collection (For example - You would stop at 9AM the day after you started)

## 2020-10-17 ENCOUNTER — Encounter: Payer: Self-pay | Admitting: Internal Medicine

## 2020-10-17 ENCOUNTER — Ambulatory Visit
Admission: RE | Admit: 2020-10-17 | Discharge: 2020-10-17 | Disposition: A | Discharge status: Home / Self Care | Payer: BLUE CROSS/BLUE SHIELD | Source: Ambulatory Visit | Attending: Internal Medicine | Admitting: Internal Medicine

## 2020-10-17 ENCOUNTER — Telehealth: Payer: Self-pay | Admitting: Internal Medicine

## 2020-10-17 DIAGNOSIS — N281 Cyst of kidney, acquired: Secondary | ICD-10-CM | POA: Diagnosis not present

## 2020-10-17 DIAGNOSIS — I10 Essential (primary) hypertension: Secondary | ICD-10-CM | POA: Insufficient documentation

## 2020-10-17 LAB — PARATHYROID HORMONE, INTACT (NO CA): PTH: 96 pg/mL — ABNORMAL HIGH (ref 14–64)

## 2020-10-17 NOTE — Addendum Note (Signed)
Addended by: Dorita Sciara on: 10/17/2020 04:27 PM   Modules accepted: Orders

## 2020-10-17 NOTE — Telephone Encounter (Signed)
Pt called returning Dr.Shamleffer's call.   Ph# [phone]

## 2020-10-17 NOTE — Telephone Encounter (Signed)
Left a message for the pt to call back on 10/17/2020 at [place]

## 2020-10-27 ENCOUNTER — Encounter: Payer: Self-pay | Admitting: Internal Medicine

## 2020-10-27 ENCOUNTER — Other Ambulatory Visit: Payer: BLUE CROSS/BLUE SHIELD

## 2020-10-27 ENCOUNTER — Other Ambulatory Visit: Payer: Self-pay

## 2020-10-27 DIAGNOSIS — E213 Hyperparathyroidism, unspecified: Secondary | ICD-10-CM

## 2020-10-28 ENCOUNTER — Ambulatory Visit
Admission: RE | Admit: 2020-10-28 | Discharge: 2020-10-28 | Disposition: A | Discharge status: Home / Self Care | Payer: BLUE CROSS/BLUE SHIELD | Source: Ambulatory Visit | Attending: Internal Medicine | Admitting: Internal Medicine

## 2020-10-28 DIAGNOSIS — E0590 Thyrotoxicosis, unspecified without thyrotoxic crisis or storm: Secondary | ICD-10-CM

## 2020-10-28 LAB — CALCIUM, URINE, 24 HOUR: Calcium, 24H Urine: 36 mg/24 h

## 2020-10-28 LAB — EXTRA URINE SPECIMEN

## 2020-10-28 LAB — CREATININE, URINE, 24 HOUR: Creatinine, 24H Ur: 0.58 g/(24.h) (ref 0.50–2.15)

## 2020-10-30 ENCOUNTER — Telehealth: Payer: Self-pay | Admitting: Internal Medicine

## 2020-10-30 NOTE — Telephone Encounter (Signed)
Left a message on 10/30/2020 to discuss results . Awaiting call back    [place], MD  [facility]  [facility] [address] [place], [place] [address] Phone: [phone] FAX: [phone]

## 2020-11-03 ENCOUNTER — Telehealth: Payer: Self-pay | Admitting: Internal Medicine

## 2020-11-03 NOTE — Telephone Encounter (Signed)
Patient called in wanted to discuss referral with Dr.Tullo

## 2020-11-04 NOTE — Telephone Encounter (Signed)
Spoke with pt and scheduled her for an appt tomorrow morning.

## 2020-11-04 NOTE — Telephone Encounter (Signed)
Patient called again about referral

## 2020-11-04 NOTE — Telephone Encounter (Signed)
Pt is needing a referral to a counselor. Would you like for pt to schedule an appt to discuss the need?

## 2020-11-04 NOTE — Telephone Encounter (Signed)
Yes I would like to discuss with her

## 2020-11-05 ENCOUNTER — Other Ambulatory Visit: Payer: Self-pay

## 2020-11-05 ENCOUNTER — Ambulatory Visit (INDEPENDENT_AMBULATORY_CARE_PROVIDER_SITE_OTHER): Payer: BLUE CROSS/BLUE SHIELD | Admitting: Internal Medicine

## 2020-11-05 ENCOUNTER — Encounter: Payer: Self-pay | Admitting: Internal Medicine

## 2020-11-05 DIAGNOSIS — G2581 Restless legs syndrome: Secondary | ICD-10-CM | POA: Diagnosis not present

## 2020-11-05 DIAGNOSIS — R0683 Snoring: Secondary | ICD-10-CM

## 2020-11-05 DIAGNOSIS — Z8616 Personal history of COVID-19: Secondary | ICD-10-CM

## 2020-11-05 DIAGNOSIS — F411 Generalized anxiety disorder: Secondary | ICD-10-CM

## 2020-11-05 DIAGNOSIS — N281 Cyst of kidney, acquired: Secondary | ICD-10-CM

## 2020-11-05 DIAGNOSIS — E0590 Thyrotoxicosis, unspecified without thyrotoxic crisis or storm: Secondary | ICD-10-CM

## 2020-11-05 DIAGNOSIS — E213 Hyperparathyroidism, unspecified: Secondary | ICD-10-CM | POA: Diagnosis not present

## 2020-11-05 DIAGNOSIS — R5383 Other fatigue: Secondary | ICD-10-CM

## 2020-11-05 DIAGNOSIS — I10 Essential (primary) hypertension: Secondary | ICD-10-CM

## 2020-11-05 LAB — BASIC METABOLIC PANEL
BUN: 16 mg/dL (ref 6–23)
CO2: 25 mEq/L (ref 19–32)
Calcium: 11.6 mg/dL — ABNORMAL HIGH (ref 8.4–10.5)
Chloride: 106 mEq/L (ref 96–112)
Creatinine, Ser: 1.08 mg/dL (ref 0.40–1.20)
GFR: 55.8 mL/min — ABNORMAL LOW (ref >60.00)
Glucose, Bld: 93 mg/dL (ref 70–99)
Potassium: 4.6 mEq/L (ref 3.5–5.1)
Sodium: 136 mEq/L (ref 135–145)

## 2020-11-05 MED ORDER — PAROXETINE HCL 20 MG PO TABS: 20.0000 mg | ORAL_TABLET | Freq: Every day | ORAL | 5 refills | Status: DC

## 2020-11-05 NOTE — Patient Instructions (Addendum)
Increase your paxil to 20 mg daily starting today  I will order your sleep study  Rechecking your calcium level today   Taking out your parathyroid hormone will solve the high calcium issue and this will help A LOT of your symptoms

## 2020-11-05 NOTE — Progress Notes (Signed)
Subjective:  Patient ID: Elizabeth [name], female    DOB: March 05, 1961  Age: 60 y.o. MRN: [id number]  CC: The primary encounter diagnosis was Hypercalcemia. Diagnoses of Restless legs, History of COVID-19, Hyperparathyroidism ([place]), Hyperthyroidism, Essential hypertension, Renal cyst, Generalized anxiety disorder, Fatigue, unspecified type, Snoring, and Restless legs syndrome were also pertinent to this visit.  HPI Elizabeth [name] presents for evaluation of request for referral for counselling  This visit occurred during the SARS-CoV-2 public health emergency.  Safety protocols were in place, including screening questions prior to the visit, additional usage of staff PPE, and extensive cleaning of exam room while observing appropriate contact time as indicated for disinfecting solutions.   60 yr old with history of Graves Hyperthyroidism managed with PTU, Hyperparathyroidism, previously asymptomatic with normal DEXA in 2021, and hypertension presents with multiple complaints.  she reports  diffuse leg pain on the right side with no prior injury or fall. She has also had trouble concentrating,  And has been losing her train of thought,  Forgetting conversations and details, including when to take her medications, and has requested referral to a counsellor  To manage her mood disorder. She is taking 10 mg paxil with no significant change.  Wakes up tired,  Sleepy during the day,  And snores.  No prior sleep study   She was evaluated by Endocrinology on FEb 10 and referred for Coletta Memos  For surgery due to rising calcium level and mild decrease in GFR.  She was asymptomatic at that time.   She has not been seen yet by Gen Surg   Outpatient Medications Prior to Visit  Medication Sig Dispense Refill  . cetirizine (ZYRTEC) 10 MG tablet Take 1 tablet (10 mg total) by mouth at bedtime as needed for allergies. 90 tablet 3  . Cholecalciferol 25 MCG (1000 UT) tablet Take by mouth.    . colchicine 0.6 MG  tablet Take 2 tablets by mouth at onset of gout flare, then in 1 hour take 1 tablet by mouth. 3 tablet 0  . cyclobenzaprine (FLEXERIL) 5 MG tablet Take 1 tablet (5 mg total) by mouth at bedtime as needed for muscle spasms. 30 tablet 2  . diclofenac (VOLTAREN) 75 MG EC tablet     . FLUTICASONE PROPIONATE NA Place into the nose.    . gabapentin (NEURONTIN) 300 MG capsule Take 300 mg by mouth daily as needed.    Marland Kitchen losartan (COZAAR) 100 MG tablet Take 1 tablet (100 mg total) by mouth daily. 90 tablet 3  . propranolol (INDERAL) 20 MG tablet TAKE 1 TABLET(20 MG) BY MOUTH THREE TIMES DAILY 270 tablet 1  . propylthiouracil (PTU) 50 MG tablet TAKE 1 TABLET(50 MG) BY MOUTH THREE TIMES DAILY 270 tablet 0  . spironolactone (ALDACTONE) 50 MG tablet TAKE 1/2 TABLET BY MOUTH DAILY FOR 3 DAYS, THEN INCREASE TO 1 TABLET IF BLOOD PRESSURE>130/80 DAY 4 IN THE AM 90 tablet 0  . triamcinolone (KENALOG) 0.1 % APPLY TOPICALLY BID. USE THIN LAYER. NO MORE THAN 7 DAYS AT A TIME.    Marland Kitchen PARoxetine (PAXIL) 10 MG tablet Take 0.5 tablets (5 mg total) by mouth daily. With dinner,  Increase to full tablet after 4 days (Patient taking differently: Take 5 mg by mouth every other day. With dinner,  Increase to full tablet after 4 days) 30 tablet 2  . ergocalciferol (VITAMIN D2) 1.25 MG (50000 UT) capsule  (Patient not taking: Reported on 11/05/2020)     No facility-administered medications prior to  visit.    Review of Systems;  Patient denies headache, fevers, malaise, unintentional weight loss, skin rash, eye pain, sinus congestion and sinus pain, sore throat, dysphagia,  hemoptysis , cough, dyspnea, wheezing, chest pain, palpitations, orthopnea, edema, abdominal pain, nausea, melena, diarrhea, constipation, flank pain, dysuria, hematuria, urinary  Frequency, nocturia, numbness,seizures,  Focal weakness, Loss of consciousness,  Tremor,  and suicidal ideation.      Objective:  BP (!) 148/82 (BP Location: Left Arm, Patient  Position: Sitting, Cuff Size: Large)   Pulse (!) 56   Temp 98.5 F (36.9 C) (Oral)   Resp 15   Ht 5\' 5"  (1.651 m)   Wt 185 lb 9.6 oz (84.2 kg)   SpO2 98%   BMI 30.89 kg/m   BP Readings from Last 3 Encounters:  11/05/20 (!) 148/82  10/16/20 130/86  08/22/20 (!) 148/100    Wt Readings from Last 3 Encounters:  11/05/20 185 lb 9.6 oz (84.2 kg)  10/16/20 181 lb 6 oz (82.3 kg)  09/04/20 185 lb (83.9 kg)    General appearance: alert, cooperative and appears stated age Ears: normal TM's and external ear canals both ears Throat: lips, mucosa, and tongue normal; teeth and gums normal Neck: no adenopathy, no carotid bruit, supple, symmetrical, trachea midline and thyroid not enlarged, symmetric, no tenderness/mass/nodules Back: symmetric, no curvature. ROM normal. No CVA tenderness. Lungs: clear to auscultation bilaterally Heart: regular rate and rhythm, S1, S2 normal, no murmur, click, rub or gallop Abdomen: soft, non-tender; bowel sounds normal; no masses,  no organomegaly Pulses: 2+ and symmetric Ext: no calf tenderness bilaterally.  No bruising.   MSK:  No pain with full ROM hips, knees and ankles Skin: Skin color, texture, turgor normal. No rashes or lesions Lymph nodes: Cervical, supraclavicular, and axillary nodes normal. Psych: affect anxious,  makes good eye contact. No fidgeting,    Denies suicidal thoughts   Lab Results  Component Value Date   HGBA1C 5.7 08/23/2019   HGBA1C 5.8 01/23/2019    Lab Results  Component Value Date   CREATININE 1.08 11/05/2020   CREATININE 1.19 10/16/2020   CREATININE 1.09 (H) 08/20/2020    Lab Results  Component Value Date   WBC 6.2 08/20/2020   HGB 13.5 08/20/2020   HCT 40.1 08/20/2020   PLT 287 08/20/2020   GLUCOSE 93 11/05/2020   CHOL 238 (H) 02/27/2019   TRIG 125.0 02/27/2019   HDL 54.90 02/27/2019   LDLCALC 158 (H) 02/27/2019   ALT 19 06/13/2020   AST 19 06/13/2020   NA 136 11/05/2020   K 4.6 11/05/2020   CL 106  11/05/2020   CREATININE 1.08 11/05/2020   BUN 16 11/05/2020   CO2 25 11/05/2020   TSH 1.74 10/16/2020   HGBA1C 5.7 08/23/2019   MICROALBUR 3.3 (H) 08/23/2019    US THYROID  Result Date: 10/29/2020 CLINICAL DATA:  Hyperthyroidism, elevated PTH EXAM: THYROID ULTRASOUND TECHNIQUE: Ultrasound examination of the thyroid gland and adjacent soft tissues was performed. COMPARISON:  None. FINDINGS: Parenchymal Echotexture: Mildly heterogenous Isthmus: 1 mm Right lobe: 5.0 x 1.6 x 1.7 cm Left lobe: 4.2 x 1.8 x 1.7 cm _________________________________________________________ Estimated total number of nodules >/= 1 cm: 0 Number of spongiform nodules >/=  2 cm not described below (TR1): 0 Number of mixed cystic and solid nodules >/= 1.5 cm not described below (TR2): 0 _________________________________________________________ Inferior to the right thyroid lobe and appearing extrathyroid, there is a solid hypoechoic nodule extending substernal and adjacent to the trachea roughly  measuring 2.9 x 1.6 x 1.6 cm suspicious for an enlarged right inferior parathyroid or parathyroid adenoma. IMPRESSION: Nonspecific mild thyroid heterogeneity. Findings consistent with an enlarged right inferior parathyroid versus parathyroid adenoma. The above is in keeping with the ACR TI-RADS recommendations - J Am Coll Radiol 2017;14:587-595. Electronically Signed   By: Jerilynn Mages.  Shick M.D.   On: 10/29/2020 08:25    Assessment & Plan:   Problem List Items Addressed This Visit      Unprioritized   Essential hypertension    Improved, but not at goal despite maximal dose of beta blocker added by Endocrinology for management of  hyperthyroidism.  Now taking spironolactone for possible  hyperaldosteronism (screened recently with equivocal results ( PAC is 7 and PAC/PRA is 33 )  And losartan 100 mg daily.   She has deferred additional medications, but would recommend transition to more potent ARB vs addition of calcium channel blocker.   Secondary cause of  RAS ruled out with recent renal doppler. Sleep study ordered today   Lab Results  Component Value Date   CREATININE 1.08 11/05/2020   Lab Results  Component Value Date   NA 136 11/05/2020   K 4.6 11/05/2020   CL 106 11/05/2020   CO2 25 11/05/2020         Relevant Orders   Home sleep test   Generalized anxiety disorder    Tolerating low dose paxil 10 mg ,  Patient advised to increase dose to 20 mg daily for current symptoms .  List of counsellors given.  Follow up one month       Relevant Medications   PARoxetine (PAXIL) 20 MG tablet   History of COVID-19    Diagnosed in late Dec 2021,  Full recovery made without hospitalization      Hypercalcemia - Primary   Relevant Orders   Basic metabolic panel (Completed)   Hyperparathyroidism ([place])    Current symptoms of bone pain and disordered thinking suggest that her hyperparathyroidism is now symptomatic.  She is apprehensive about surgery and does not understand what will be done.  Surgery described in laymen's terms.  Repeat calcium level done today is stable .  Increased paxil dose and reiterated her need to go ahead with surgical parathyroidectomy      Hyperthyroidism    Secondary to Grave's disease per Endocrinology, managed with  PTU and propranolol.  TSH is WNL  Lab Results  Component Value Date   TSH 1.74 10/16/2020         Renal cyst    Incidental finding on renal artery doppler   Right kidney,  Simple  Feb 2022. No workup needed       Restless legs syndrome    Symptoms are mild bu contributing to her poor sleep .  Iron studies are normal .  She defers medication at this time.        Other Visit Diagnoses    Restless legs       Relevant Orders   Iron, TIBC and Ferritin Panel (Completed)   Fatigue, unspecified type       Relevant Orders   Home sleep test   Snoring       Relevant Orders   Home sleep test      I have discontinued Deion Fielden's PARoxetine and ergocalciferol. I  have also changed her PARoxetine. Additionally, I am having her maintain her FLUTICASONE PROPIONATE NA, colchicine, propylthiouracil, gabapentin, cetirizine, triamcinolone, Cholecalciferol, diclofenac, losartan, cyclobenzaprine, propranolol, and spironolactone.  Meds ordered  this encounter  Medications  . DISCONTD: PARoxetine (PAXIL) 20 MG tablet    Sig: Take 1 tablet (20 mg total) by mouth daily. With dinner,  Increase to full tablet after 4 days    Dispense:  30 tablet    Refill:  5  . PARoxetine (PAXIL) 20 MG tablet    Sig: Take 1 tablet (20 mg total) by mouth daily. With dinner    Dispense:  30 tablet    Refill:  5    Corrected instructions   I provided  30 minutes of  face-to-face time during this encounter reviewing patient's current problems and past surgeries, labs and imaging studies, providing counseling on the above mentioned problems , and coordination  of care .  Medications Discontinued During This Encounter  Medication Reason  . ergocalciferol (VITAMIN D2) 1.25 MG (50000 UT) capsule   . PARoxetine (PAXIL) 10 MG tablet   . PARoxetine (PAXIL) 20 MG tablet Reorder  . PARoxetine (PAXIL) 10 MG tablet     Follow-up: No follow-ups on file.   Crecencio Mc, MD

## 2020-11-06 LAB — IRON,TIBC AND FERRITIN PANEL
%SAT: 17 % (calc) (ref 16–45)
Ferritin: 54 ng/mL (ref 16–232)
Iron: 54 ug/dL (ref 45–160)
TIBC: 312 mcg/dL (calc) (ref 250–450)

## 2020-11-08 DIAGNOSIS — G2581 Restless legs syndrome: Secondary | ICD-10-CM | POA: Insufficient documentation

## 2020-11-08 DIAGNOSIS — N281 Cyst of kidney, acquired: Secondary | ICD-10-CM | POA: Insufficient documentation

## 2020-11-08 MED ORDER — PAROXETINE HCL 20 MG PO TABS: 20.0000 mg | ORAL_TABLET | Freq: Every day | ORAL | 5 refills | Status: DC

## 2020-11-08 NOTE — Assessment & Plan Note (Signed)
Current symptoms of bone pain and disordered thinking suggest that her hyperparathyroidism is now symptomatic.  She is apprehensive about surgery and does not understand what will be done.  Surgery described in laymen's terms.  Repeat calcium level done today is stable .  Increased paxil dose and reiterated her need to go ahead with surgical parathyroidectomy

## 2020-11-08 NOTE — Assessment & Plan Note (Signed)
Tolerating low dose paxil 10 mg ,  Patient advised to increase dose to 20 mg daily for current symptoms .  List of counsellors given.  Follow up one month

## 2020-11-08 NOTE — Assessment & Plan Note (Signed)
Symptoms are mild bu contributing to her poor sleep .  Iron studies are normal .  She defers medication at this time.

## 2020-11-08 NOTE — Assessment & Plan Note (Signed)
Incidental finding on renal artery doppler   Right kidney,  Simple  Feb 2022. No workup needed

## 2020-11-08 NOTE — Assessment & Plan Note (Addendum)
Improved, but not at goal despite maximal dose of beta blocker added by Endocrinology for management of  hyperthyroidism.  Now taking spironolactone for possible  hyperaldosteronism (screened recently with equivocal results ( PAC is 7 and PAC/PRA is 33 )  And losartan 100 mg daily.   She has deferred additional medications, but would recommend transition to more potent ARB vs addition of calcium channel blocker.  Secondary cause of  RAS ruled out with recent renal doppler. Sleep study ordered today   Lab Results  Component Value Date   CREATININE 1.08 11/05/2020   Lab Results  Component Value Date   NA 136 11/05/2020   K 4.6 11/05/2020   CL 106 11/05/2020   CO2 25 11/05/2020

## 2020-11-08 NOTE — Assessment & Plan Note (Signed)
Diagnosed in late Dec 2021,  Full recovery made without hospitalization

## 2020-11-08 NOTE — Assessment & Plan Note (Addendum)
Secondary to Grave's disease per Endocrinology, managed with  PTU and propranolol.  TSH is WNL  Lab Results  Component Value Date   TSH 1.74 10/16/2020

## 2020-11-19 ENCOUNTER — Other Ambulatory Visit: Payer: Self-pay | Admitting: Internal Medicine

## 2020-11-19 DIAGNOSIS — I10 Essential (primary) hypertension: Secondary | ICD-10-CM

## 2020-12-03 DIAGNOSIS — E0590 Thyrotoxicosis, unspecified without thyrotoxic crisis or storm: Secondary | ICD-10-CM | POA: Diagnosis not present

## 2020-12-03 DIAGNOSIS — E210 Primary hyperparathyroidism: Secondary | ICD-10-CM | POA: Diagnosis not present

## 2020-12-04 ENCOUNTER — Other Ambulatory Visit (HOSPITAL_COMMUNITY): Payer: Self-pay | Admitting: Surgery

## 2020-12-04 ENCOUNTER — Other Ambulatory Visit: Payer: Self-pay | Admitting: Surgery

## 2020-12-04 DIAGNOSIS — E210 Primary hyperparathyroidism: Secondary | ICD-10-CM

## 2020-12-06 ENCOUNTER — Emergency Department
Admission: EM | Admit: 2020-12-06 | Discharge: 2020-12-06 | Disposition: A | Discharge status: Home / Self Care | Payer: BLUE CROSS/BLUE SHIELD | Attending: Emergency Medicine | Admitting: Emergency Medicine

## 2020-12-06 ENCOUNTER — Other Ambulatory Visit: Payer: Self-pay

## 2020-12-06 ENCOUNTER — Emergency Department: Payer: BLUE CROSS/BLUE SHIELD

## 2020-12-06 ENCOUNTER — Encounter: Payer: Self-pay | Admitting: Intensive Care

## 2020-12-06 DIAGNOSIS — Z87891 Personal history of nicotine dependence: Secondary | ICD-10-CM | POA: Insufficient documentation

## 2020-12-06 DIAGNOSIS — S139XXA Sprain of joints and ligaments of unspecified parts of neck, initial encounter: Secondary | ICD-10-CM

## 2020-12-06 DIAGNOSIS — W010XXA Fall on same level from slipping, tripping and stumbling without subsequent striking against object, initial encounter: Secondary | ICD-10-CM | POA: Diagnosis not present

## 2020-12-06 DIAGNOSIS — S199XXA Unspecified injury of neck, initial encounter: Secondary | ICD-10-CM | POA: Diagnosis not present

## 2020-12-06 DIAGNOSIS — Z8616 Personal history of COVID-19: Secondary | ICD-10-CM | POA: Insufficient documentation

## 2020-12-06 DIAGNOSIS — Y92009 Unspecified place in unspecified non-institutional (private) residence as the place of occurrence of the external cause: Secondary | ICD-10-CM | POA: Diagnosis not present

## 2020-12-06 DIAGNOSIS — Z043 Encounter for examination and observation following other accident: Secondary | ICD-10-CM | POA: Diagnosis not present

## 2020-12-06 DIAGNOSIS — S134XXA Sprain of ligaments of cervical spine, initial encounter: Secondary | ICD-10-CM | POA: Insufficient documentation

## 2020-12-06 DIAGNOSIS — Z79899 Other long term (current) drug therapy: Secondary | ICD-10-CM | POA: Insufficient documentation

## 2020-12-06 DIAGNOSIS — I10 Essential (primary) hypertension: Secondary | ICD-10-CM | POA: Insufficient documentation

## 2020-12-06 DIAGNOSIS — S40011A Contusion of right shoulder, initial encounter: Secondary | ICD-10-CM | POA: Diagnosis not present

## 2020-12-06 DIAGNOSIS — M25511 Pain in right shoulder: Secondary | ICD-10-CM | POA: Diagnosis not present

## 2020-12-06 MED ORDER — MELOXICAM 15 MG PO TABS: 15.0000 mg | ORAL_TABLET | Freq: Every day | ORAL | 2 refills | Status: DC

## 2020-12-06 MED ORDER — BACLOFEN 10 MG PO TABS: 10.0000 mg | ORAL_TABLET | Freq: Three times a day (TID) | ORAL | 0 refills | Status: AC

## 2020-12-06 NOTE — ED Notes (Signed)
Pt with pain in right shoulder after fall on Tuesday. Pt states she cannot lift her left arm fully cue to pain and stiffness. Pt walked to bathroom, gait steady.

## 2020-12-06 NOTE — Discharge Instructions (Addendum)
Follow-up with your regular doctor if not improving in 2 to 3 days.  Return emergency department worsening.  By ice to the right shoulder.  Take medication as prescribed.  Do not use Voltaren gel while taking the meloxicam. Wear the sling for comfort.  However we do need to take your arm out of the sling several times daily move your shoulder around she will not get a frozen shoulder.

## 2020-12-06 NOTE — ED Notes (Signed)
Patient transported to X-ray 

## 2020-12-06 NOTE — ED Provider Notes (Signed)
[facility] Emergency Department Provider Note  ____________________________________________   Event Date/Time   First MD Initiated Contact with Patient 12/06/20 1020     (approximate)  I have reviewed the triage vital signs and the nursing notes.   HISTORY  Chief Complaint Fall    HPI Elizabeth [name] is a 60 y.o. female presents emergency department after a fall on Tuesday.  Patient states that she slipped at her daughter's house and fell landing on the right shoulder.  States she is having right shoulder pain and neck pain.  Was sore in the back of her leg but that is getting better at this time.  No numbness or tingling.  No LOC.    Past Medical History:  Diagnosis Date  . Gout   . Hypertension   . Thyroid disease   . Vertigo     Patient Active Problem List   Diagnosis Date Noted  . Renal cyst 11/08/2020  . Restless legs syndrome 11/08/2020  . History of COVID-19 09/04/2020  . Breast mass, right 06/24/2020  . CTS (carpal tunnel syndrome) 06/24/2020  . Nausea 06/24/2020  . Essential hypertension 06/13/2020  . Generalized anxiety disorder 05/05/2020  . Abnormal auditory perception of both ears 04/03/2020  . Dizziness 04/03/2020  . Dysfunction of both eustachian tubes 01/03/2020  . Allergic rhinitis 01/03/2020  . Hyperparathyroidism ([place]) 12/10/2019  . Cervical myofascial pain syndrome 09/16/2019  . Hypercalcemia 08/22/2019  . Chronic left shoulder pain 08/22/2019  . Hyperlipidemia 03/19/2019  . 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 Hyperthyroidism 01/12/2019  . Neuropathy 01/12/2019    Past Surgical History:  Procedure Laterality Date  . ENDOMETRIAL ABLATION  2010    Prior to Admission medications   Medication Sig Start Date End Date Taking? Authorizing Provider  baclofen (LIORESAL) 10 MG tablet Take 1 tablet (10 mg total) by mouth 3 (three) times daily for 7 days. 12/06/20 12/13/20 Yes Imogene Gravelle, Linden Dolin, PA-C  meloxicam (MOBIC) 15  MG tablet Take 1 tablet (15 mg total) by mouth daily. 12/06/20 12/06/21 Yes Burlene Montecalvo, Linden Dolin, PA-C  cetirizine (ZYRTEC) 10 MG tablet Take 1 tablet (10 mg total) by mouth at bedtime as needed for allergies. 01/02/20   McLean-Scocuzza, Nino Glow, MD  Cholecalciferol 25 MCG (1000 UT) tablet Take by mouth.    [provider]  colchicine 0.6 MG tablet Take 2 tablets by mouth at onset of gout flare, then in 1 hour take 1 tablet by mouth. 01/12/19   Jodelle Green, FNP  cyclobenzaprine (FLEXERIL) 5 MG tablet Take 1 tablet (5 mg total) by mouth at bedtime as needed for muscle spasms. 08/22/20   McLean-Scocuzza, Nino Glow, MD  diclofenac (VOLTAREN) 75 MG EC tablet  08/22/19   [provider]  FLUTICASONE PROPIONATE NA Place into the nose.    [provider]  gabapentin (NEURONTIN) 300 MG capsule Take 300 mg by mouth daily as needed. 09/20/18   [provider]  losartan (COZAAR) 100 MG tablet Take 1 tablet (100 mg total) by mouth daily. 08/22/20   McLean-Scocuzza, Nino Glow, MD  PARoxetine (PAXIL) 20 MG tablet Take 1 tablet (20 mg total) by mouth daily. With dinner 11/08/20   Crecencio Mc, MD  propranolol (INDERAL) 20 MG tablet TAKE 1 TABLET(20 MG) BY MOUTH THREE TIMES DAILY 09/18/20   Leone Haven, MD  propylthiouracil (PTU) 50 MG tablet TAKE 1 TABLET(50 MG) BY MOUTH THREE TIMES DAILY 11/19/19   Leone Haven, MD  spironolactone (ALDACTONE)  50 MG tablet TAKE 1/2 TABLET BY MOUTH DAILY FOR 3 DAYS, THEN INCREASE TO 1 TABLET IF BLOOD PRESSURE>130/80 DAY 4 IN THE AM 11/19/20   McLean-Scocuzza, Nino Glow, MD  triamcinolone (KENALOG) 0.1 % APPLY TOPICALLY BID. USE THIN LAYER. NO MORE THAN 7 DAYS AT A TIME. 07/03/19   [provider]    Allergies Codeine, Gabapentin, Metoprolol, and Tramadol  Family History  Problem Relation Age of Onset  . Hypertension Mother   . Heart disease Mother   . Heart failure Mother   . Hypertension Father     Social History Social History    Tobacco Use  . Smoking status: Former Smoker    Quit date: 01/12/1999    Years since quitting: 21.9  . Smokeless tobacco: Never Used  Vaping Use  . Vaping Use: Never used  Substance Use Topics  . Alcohol use: Yes    Comment: occ  . Drug use: Never    Review of Systems  Constitutional: No fever/chills Eyes: No visual changes. ENT: No sore throat. Respiratory: Denies cough Cardiovascular: Denies chest pain Gastrointestinal: Denies abdominal pain Genitourinary: Negative for dysuria. Musculoskeletal: Negative for back pain.  Positive for right shoulder pain and neck pain Skin: Negative for rash. Psychiatric: no mood changes,     ____________________________________________   PHYSICAL EXAM:  VITAL SIGNS: ED Triage Vitals  Enc Vitals Group     BP 12/06/20 0947 (!) 161/89     Pulse Rate 12/06/20 0947 (!) 59     Resp 12/06/20 0947 16     Temp 12/06/20 0947 98.4 F (36.9 C)     Temp Source 12/06/20 0947 Oral     SpO2 12/06/20 0947 97 %     Weight 12/06/20 0946 184 lb (83.5 kg)     Height 12/06/20 0946 5\' 5"  (1.651 m)     Head Circumference --      Peak Flow --      Pain Score 12/06/20 0946 7     Pain Loc --      Pain Edu? --      Excl. in [place]? --     Constitutional: Alert and oriented. Well appearing and in no acute distress. Eyes: Conjunctivae are normal.  Head: Atraumatic. Nose: No congestion/rhinnorhea. Mouth/Throat: Mucous membranes are moist.   Neck:  supple no lymphadenopathy noted Cardiovascular: Normal rate, regular rhythm. Heart sounds are normal Respiratory: Normal respiratory effort.  No retractions, lungs c t a  GU: deferred Musculoskeletal: Decreased range of motion the right shoulder, tender at the joint, tender along clavicle distally, neurovascular is intact, C-spine is mildly tender, grips are equal bilaterally  neurologic:  Normal speech and language.  Skin:  Skin is warm, dry and intact. No rash noted. Psychiatric: Mood and affect are  normal. Speech and behavior are normal.  ____________________________________________   LABS (all labs ordered are listed, but only abnormal results are displayed)  Labs Reviewed - No data to display ____________________________________________   ____________________________________________  RADIOLOGY  X-ray of the right shoulder and C-spine  ____________________________________________   PROCEDURES  Procedure(s) performed: Sling applied by nursing staff   Procedures    ____________________________________________   INITIAL IMPRESSION / ASSESSMENT AND PLAN / ED COURSE  Pertinent labs & imaging results that were available during my care of the patient were reviewed by me and considered in my medical decision making (see chart for details).   Patient 60 year old female presents after a fall.  See HPI.  Physical exam shows patient per stable  X-ray of the right shoulder and C-spine   X-rays of the right shoulder and C-spine reviewed by me confirmed by radiology to be negative for any acute abnormalities  I did explain findings to the patient.  She was placed in a sling for comfort.  Given a prescription for meloxicam and baclofen.  She is to follow-up with orthopedics if not improved in 5 to 7 days.  Return emergency department worsening.  I did caution her not to wear the sling all the time.  She will need to take her arm out of the sling and move her shoulder around so she will not get a frozen shoulder.  She states she understands.  She is discharged stable condition.  Liani Caris was evaluated in Emergency Department on 12/06/2020 for the symptoms described in the history of present illness. She was evaluated in the context of the global COVID-19 pandemic, which necessitated consideration that the patient might be at risk for infection with the SARS-CoV-2 virus that causes COVID-19. Institutional protocols and algorithms that pertain to the evaluation of patients at risk  for COVID-19 are in a state of rapid change based on information released by regulatory bodies including the CDC and federal and state organizations. These policies and algorithms were followed during the patient's care in the ED.    As part of my medical decision making, I reviewed the following data within the [facility] notes reviewed and incorporated, Old chart reviewed, Radiograph reviewed , Notes from prior ED visits and [place] Controlled Substance Database  ____________________________________________   FINAL CLINICAL IMPRESSION(S) / ED DIAGNOSES  Final diagnoses:  Contusion of multiple sites of right shoulder, initial encounter  Cervical sprain, initial encounter      NEW MEDICATIONS STARTED DURING THIS VISIT:  New Prescriptions   BACLOFEN (LIORESAL) 10 MG TABLET    Take 1 tablet (10 mg total) by mouth 3 (three) times daily for 7 days.   MELOXICAM (MOBIC) 15 MG TABLET    Take 1 tablet (15 mg total) by mouth daily.     Note:  This document was prepared using Dragon voice recognition software and may include unintentional dictation errors.    Versie Starks, PA-C 12/06/20 1145    Harvest Dark, MD 12/06/20 1420

## 2020-12-06 NOTE — ED Triage Notes (Signed)
Patient reports mechanical fall on Wednesday and now experiencing right shoulder pain and neck pain. Reports some pain to back of right leg. Ambulatory into triage no problem

## 2020-12-22 ENCOUNTER — Encounter (HOSPITAL_COMMUNITY)
Admission: RE | Admit: 2020-12-22 | Discharge: 2020-12-22 | Disposition: A | Discharge status: Home / Self Care | Payer: BLUE CROSS/BLUE SHIELD | Source: Ambulatory Visit | Attending: Surgery | Admitting: Surgery

## 2020-12-22 ENCOUNTER — Other Ambulatory Visit: Payer: Self-pay

## 2020-12-22 ENCOUNTER — Ambulatory Visit (HOSPITAL_COMMUNITY)
Admission: RE | Admit: 2020-12-22 | Discharge: 2020-12-22 | Disposition: A | Discharge status: Home / Self Care | Payer: BLUE CROSS/BLUE SHIELD | Source: Ambulatory Visit | Attending: Surgery | Admitting: Surgery

## 2020-12-22 DIAGNOSIS — E210 Primary hyperparathyroidism: Secondary | ICD-10-CM | POA: Diagnosis not present

## 2020-12-22 DIAGNOSIS — E213 Hyperparathyroidism, unspecified: Secondary | ICD-10-CM | POA: Diagnosis not present

## 2020-12-22 MED ORDER — TECHNETIUM TC 99M SESTAMIBI GENERIC - CARDIOLITE
25.5000 | Freq: Once | INTRAVENOUS | Status: AC | PRN
  Administered 2020-12-22: 25.5 via INTRAVENOUS

## 2020-12-24 ENCOUNTER — Other Ambulatory Visit: Payer: Self-pay | Admitting: Sports Medicine

## 2020-12-24 DIAGNOSIS — M25511 Pain in right shoulder: Secondary | ICD-10-CM

## 2020-12-24 DIAGNOSIS — W19XXXA Unspecified fall, initial encounter: Secondary | ICD-10-CM

## 2020-12-24 DIAGNOSIS — W010XXA Fall on same level from slipping, tripping and stumbling without subsequent striking against object, initial encounter: Secondary | ICD-10-CM | POA: Diagnosis not present

## 2020-12-24 DIAGNOSIS — M7541 Impingement syndrome of right shoulder: Secondary | ICD-10-CM | POA: Diagnosis not present

## 2020-12-24 DIAGNOSIS — S46011A Strain of muscle(s) and tendon(s) of the rotator cuff of right shoulder, initial encounter: Secondary | ICD-10-CM

## 2020-12-28 ENCOUNTER — Ambulatory Visit
Admission: RE | Admit: 2020-12-28 | Discharge: 2020-12-28 | Disposition: A | Discharge status: Home / Self Care | Payer: BLUE CROSS/BLUE SHIELD | Source: Ambulatory Visit | Attending: Sports Medicine | Admitting: Sports Medicine

## 2020-12-28 ENCOUNTER — Other Ambulatory Visit: Payer: Self-pay

## 2020-12-28 DIAGNOSIS — Y939 Activity, unspecified: Secondary | ICD-10-CM | POA: Insufficient documentation

## 2020-12-28 DIAGNOSIS — M25511 Pain in right shoulder: Secondary | ICD-10-CM | POA: Insufficient documentation

## 2020-12-28 DIAGNOSIS — Y929 Unspecified place or not applicable: Secondary | ICD-10-CM | POA: Insufficient documentation

## 2020-12-28 DIAGNOSIS — S46011A Strain of muscle(s) and tendon(s) of the rotator cuff of right shoulder, initial encounter: Secondary | ICD-10-CM | POA: Insufficient documentation

## 2020-12-28 DIAGNOSIS — W19XXXA Unspecified fall, initial encounter: Secondary | ICD-10-CM | POA: Diagnosis not present

## 2020-12-29 ENCOUNTER — Ambulatory Visit: Payer: Self-pay | Admitting: Surgery

## 2020-12-31 DIAGNOSIS — S46011A Strain of muscle(s) and tendon(s) of the rotator cuff of right shoulder, initial encounter: Secondary | ICD-10-CM | POA: Diagnosis not present

## 2021-01-01 ENCOUNTER — Other Ambulatory Visit: Payer: Self-pay

## 2021-01-01 ENCOUNTER — Other Ambulatory Visit: Payer: Self-pay | Admitting: Orthopedic Surgery

## 2021-01-01 ENCOUNTER — Encounter (HOSPITAL_COMMUNITY): Payer: Self-pay

## 2021-01-01 ENCOUNTER — Encounter (HOSPITAL_COMMUNITY)
Admission: RE | Admit: 2021-01-01 | Discharge: 2021-01-01 | Disposition: A | Discharge status: Home / Self Care | Payer: BLUE CROSS/BLUE SHIELD | Source: Ambulatory Visit | Attending: Surgery | Admitting: Surgery

## 2021-01-01 DIAGNOSIS — Z01812 Encounter for preprocedural laboratory examination: Secondary | ICD-10-CM | POA: Insufficient documentation

## 2021-01-01 HISTORY — DX: Other complications of anesthesia, initial encounter: T88.59XA

## 2021-01-01 HISTORY — DX: Anxiety disorder, unspecified: F41.9

## 2021-01-01 HISTORY — DX: Gastro-esophageal reflux disease without esophagitis: K21.9

## 2021-01-01 HISTORY — DX: Thyrotoxicosis, unspecified without thyrotoxic crisis or storm: E05.90

## 2021-01-01 HISTORY — DX: Unspecified osteoarthritis, unspecified site: M19.90

## 2021-01-01 LAB — CBC
HCT: 40.9 % (ref 36.0–46.0)
Hemoglobin: 13.4 g/dL (ref 12.0–15.0)
MCH: 26.3 pg (ref 26.0–34.0)
MCHC: 32.8 g/dL (ref 30.0–36.0)
MCV: 80.4 fL (ref 80.0–100.0)
Platelets: 299 10*3/uL (ref 150–400)
RBC: 5.09 MIL/uL (ref 3.87–5.11)
RDW: 14 % (ref 11.5–15.5)
WBC: 5.5 10*3/uL (ref 4.0–10.5)
nRBC: 0 % (ref 0.0–0.2)

## 2021-01-01 LAB — BASIC METABOLIC PANEL
Anion gap: 7 (ref 5–15)
BUN: 16 mg/dL (ref 6–20)
CO2: 24 mmol/L (ref 22–32)
Calcium: 11.6 mg/dL — ABNORMAL HIGH (ref 8.9–10.3)
Chloride: 108 mmol/L (ref 98–111)
Creatinine, Ser: 1.03 mg/dL — ABNORMAL HIGH (ref 0.44–1.00)
GFR, Estimated: >60 mL/min (ref >60)
Glucose, Bld: 97 mg/dL (ref 70–99)
Potassium: 4.4 mmol/L (ref 3.5–5.1)
Sodium: 139 mmol/L (ref 135–145)

## 2021-01-01 NOTE — Progress Notes (Addendum)
COVID Vaccine Completed: x2 Date COVID Vaccine completed: 01-30-20 & 02-25-20 Has received booster: No COVID vaccine manufacturer: [employer]   Date of COVID positive in last 90 days:  N/A  PCP - [name], MD Cardiologist - N/A  Chest x-ray - N/A EKG - 08-20-20 Epic Stress Test -  ECHO -  Cardiac Cath -  Pacemaker/ICD device last checked: Spinal Cord Stimulator:  Sleep Study - Scheduled to have test CPAP -   Fasting Blood Sugar - N/A Checks Blood Sugar _____ times a day  Blood Thinner Instructions:  N/A Aspirin Instructions: Last Dose:  Activity level:  Can go up a flight of stairs and perform activities of daily living without stopping and without symptoms of chest pain or shortness of breath.    Anesthesia review:  N/A  Patient denies shortness of breath, fever, cough and chest pain at PAT appointment   Patient verbalized understanding of instructions that were given to them at the PAT appointment. Patient was also instructed that they will need to review over the PAT instructions again at home before surgery.

## 2021-01-01 NOTE — Patient Instructions (Addendum)
DUE TO COVID-19 ONLY ONE VISITOR IS ALLOWED TO COME WITH YOU AND STAY IN THE WAITING ROOM ONLY DURING PRE OP AND PROCEDURE.   **NO VISITORS ARE ALLOWED IN THE SHORT STAY AREA OR RECOVERY ROOM!!**    COVID SWAB TESTING MUST BE COMPLETED ON:   Friday, 01-02-21 @ 9:55 AM   [address] [place], [place] [address]  (Must self quarantine after testing. Follow instructions on handout.)        Your procedure is scheduled on:  Monday, 01-05-21   Report to [facility] Main  Entrance   Report to Short Stay at 5:15 AM   [facility])   Call this number if you have problems the morning of surgery [phone]   Do not eat food :After Midnight.   May have liquids until 4:30 AM  day of surgery  CLEAR LIQUID DIET  Foods Allowed                                                                     Foods Excluded  Water, Black Coffee and tea, regular and decaf             liquids that you cannot  Plain Jell-O in any flavor  (No red)                                    see through such as: Fruit ices (not with fruit pulp)                                      milk, soups, orange juice              Iced Popsicles (No red)                                      All solid food                                   Apple juices Sports drinks like Gatorade (No red) Lightly seasoned clear broth or consume(fat free) Sugar, honey syrup      Oral Hygiene is also important to reduce your risk of infection.                                    Remember - BRUSH YOUR TEETH THE MORNING OF SURGERY WITH YOUR REGULAR TOOTHPASTE   Do NOT smoke after Midnight   Take these medicines the morning of surgery with A SIP OF WATER:  Gabapentin, Paxil, Propranolol, PTU, Cetirizine, Colchicine if needed                               You may not have any metal on your body including hair pins, jewelry, and body piercings  Do not wear make-up, lotions, powders, perfumes/cologne, or deodorant             Do not  wear nail polish.  Do not shave  48 hours prior to surgery.        Do not bring valuables to the hospital. [place].   Contacts, dentures or bridgework may not be worn into surgery.    Patients discharged the day of surgery will not be allowed to drive home.   Please read over the following fact sheets you were given: IF YOU HAVE QUESTIONS ABOUT YOUR PRE OP INSTRUCTIONS PLEASE  CALL [facility] - Preparing for Surgery Before surgery, you can play an important role.  Because skin is not sterile, your skin needs to be as free of germs as possible.  You can reduce the number of germs on your skin by washing with CHG (chlorahexidine gluconate) soap before surgery.  CHG is an antiseptic cleaner which kills germs and bonds with the skin to continue killing germs even after washing. Please DO NOT use if you have an allergy to CHG or antibacterial soaps.  If your skin becomes reddened/irritated stop using the CHG and inform your nurse when you arrive at Short Stay. Do not shave (including legs and underarms) for at least 48 hours prior to the first CHG shower.  You may shave your face/neck.  Please follow these instructions carefully:  1.  Shower with CHG Soap the night before surgery and the  morning of surgery.  2.  If you choose to wash your hair, wash your hair first as usual with your normal  shampoo.  3.  After you shampoo, rinse your hair and body thoroughly to remove the shampoo.                             4.  Use CHG as you would any other liquid soap.  You can apply chg directly to the skin and wash.  Gently with a scrungie or clean washcloth.  5.  Apply the CHG Soap to your body ONLY FROM THE NECK DOWN.   Do   not use on face/ open                           Wound or open sores. Avoid contact with eyes, ears mouth and   genitals (private parts).                       Wash face,  Genitals (private parts) with your normal soap.              6.  Wash thoroughly, paying special attention to the area where your    surgery  will be performed.  7.  Thoroughly rinse your body with warm water from the neck down.  8.  DO NOT shower/wash with your normal soap after using and rinsing off the CHG Soap.                9.  Pat yourself dry with a clean towel.            10.  Wear clean pajamas.            11.  Place clean sheets on your bed the night of your first shower and do not  sleep with  pets. Day of Surgery : Do not apply any lotions/deodorants the morning of surgery.  Please wear clean clothes to the hospital/surgery center.  FAILURE TO FOLLOW THESE INSTRUCTIONS MAY RESULT IN THE CANCELLATION OF YOUR SURGERY  PATIENT SIGNATURE_________________________________  NURSE SIGNATURE__________________________________  ________________________________________________________________________

## 2021-01-01 NOTE — Patient Instructions (Signed)
DUE TO COVID-19 ONLY ONE VISITOR IS ALLOWED TO COME WITH YOU AND STAY IN THE WAITING ROOM ONLY DURING PRE OP AND PROCEDURE.   **NO VISITORS ARE ALLOWED IN THE SHORT STAY AREA OR RECOVERY ROOM!!**  IF YOU WILL BE ADMITTED INTO THE HOSPITAL YOU ARE ALLOWED ONLY TWO SUPPORT PEOPLE DURING VISITATION HOURS ONLY (10AM -8PM)   . The support person(s) may change daily. . The support person(s) must pass our screening, gel in and out, and wear a mask at all times, including in the patient's room. . Patients must also wear a mask when staff or their support person are in the room.  No visitors under the age of 55. Any visitor under the age of 56 must be accompanied by an adult.    COVID SWAB TESTING MUST BE COMPLETED ON:   01-02-21 @ 9:55 AM   [address] [place], [place] [address]  (Must self quarantine after testing. Follow instructions on handout.)        Your procedure is scheduled on:  Monday, 01-05-21   Report to [facility] Main  Entrance   Report to Short Stay at 5:15 AM   [facility])    Call this number if you have problems the morning of surgery [phone]   Do not eat food :After Midnight.   May have liquids until 4:30 AM day of surgery  CLEAR LIQUID DIET  Foods Allowed                                                                     Foods Excluded  Water, Black Coffee and tea, regular and decaf              liquids that you cannot  Plain Jell-O in any flavor  (No red)                                     see through such as: Fruit ices (not with fruit pulp)                                      milk, soups, orange juice              Iced Popsicles (No red)                                      All solid food                                   Apple juices Sports drinks like Gatorade (No red) Lightly seasoned clear broth or consume(fat free) Sugar, honey syrup  Oral Hygiene is also important to reduce your risk of infection.                                     Remember - BRUSH YOUR TEETH THE  MORNING OF SURGERY WITH YOUR REGULAR TOOTHPASTE   Do NOT smoke after Midnight   Take these medicines the morning of surgery with A SIP OF WATER:  Colchicine, Gabapentin, Paxil, Propranolol, Prophlthiouracil                                You may not have any metal on your body including hair pins, jewelry, and body piercings             Do not wear make-up, lotions, powders, perfumes/cologne, or deodorant             Do not wear nail polish.  Do not shave  48 hours prior to surgery.     Do not bring valuables to the hospital. [place].   Contacts, dentures or bridgework may not be worn into surgery.     Patients discharged the day of surgery will not be allowed to drive home.   Special Instructions: Bring a copy of your healthcare power of attorney and living will documents         the day of surgery if you haven't scanned them in before.              Please read over the following fact sheets you were given: IF YOU HAVE QUESTIONS ABOUT YOUR PRE OP INSTRUCTIONS PLEASE CALL [phone]   [place] [facility] - Preparing for Surgery Before surgery, you can play an important role.  Because skin is not sterile, your skin needs to be as free of germs as possible.  You can reduce the number of germs on your skin by washing with CHG (chlorahexidine gluconate) soap before surgery.  CHG is an antiseptic cleaner which kills germs and bonds with the skin to continue killing germs even after washing. Please DO NOT use if you have an allergy to CHG or antibacterial soaps.  If your skin becomes reddened/irritated stop using the CHG and inform your nurse when you arrive at Short Stay. Do not shave (including legs and underarms) for at least 48 hours prior to the first CHG shower.  You may shave your face/neck.  Please follow these instructions carefully:  1.  Shower with CHG Soap the night before surgery and the  morning of surgery.  2.   If you choose to wash your hair, wash your hair first as usual with your normal  shampoo.  3.  After you shampoo, rinse your hair and body thoroughly to remove the shampoo.                             4.  Use CHG as you would any other liquid soap.  You can apply chg directly to the skin and wash.  Gently with a scrungie or clean washcloth.  5.  Apply the CHG Soap to your body ONLY FROM THE NECK DOWN.   Do   not use on face/ open                           Wound or open sores. Avoid contact with eyes, ears mouth and   genitals (private parts).                       Wash face,  Genitals (private parts) with your normal soap.  6.  Wash thoroughly, paying special attention to the area where your    surgery  will be performed.  7.  Thoroughly rinse your body with warm water from the neck down.  8.  DO NOT shower/wash with your normal soap after using and rinsing off the CHG Soap.                9.  Pat yourself dry with a clean towel.            10.  Wear clean pajamas.            11.  Place clean sheets on your bed the night of your first shower and do not  sleep with pets. Day of Surgery : Do not apply any lotions/deodorants the morning of surgery.  Please wear clean clothes to the hospital/surgery center.  FAILURE TO FOLLOW THESE INSTRUCTIONS MAY RESULT IN THE CANCELLATION OF YOUR SURGERY  PATIENT SIGNATURE_________________________________  NURSE SIGNATURE__________________________________  ________________________________________________________________________

## 2021-01-02 ENCOUNTER — Other Ambulatory Visit (HOSPITAL_COMMUNITY)
Admission: RE | Admit: 2021-01-02 | Discharge: 2021-01-02 | Disposition: A | Discharge status: Home / Self Care | Payer: BLUE CROSS/BLUE SHIELD | Source: Ambulatory Visit | Attending: Surgery | Admitting: Surgery

## 2021-01-02 DIAGNOSIS — Z01812 Encounter for preprocedural laboratory examination: Secondary | ICD-10-CM | POA: Insufficient documentation

## 2021-01-02 DIAGNOSIS — Z79899 Other long term (current) drug therapy: Secondary | ICD-10-CM | POA: Diagnosis not present

## 2021-01-02 DIAGNOSIS — Z20822 Contact with and (suspected) exposure to covid-19: Secondary | ICD-10-CM | POA: Insufficient documentation

## 2021-01-02 DIAGNOSIS — E0590 Thyrotoxicosis, unspecified without thyrotoxic crisis or storm: Secondary | ICD-10-CM | POA: Diagnosis not present

## 2021-01-02 DIAGNOSIS — Z87891 Personal history of nicotine dependence: Secondary | ICD-10-CM | POA: Diagnosis not present

## 2021-01-02 DIAGNOSIS — Z885 Allergy status to narcotic agent status: Secondary | ICD-10-CM | POA: Diagnosis not present

## 2021-01-02 DIAGNOSIS — D351 Benign neoplasm of parathyroid gland: Secondary | ICD-10-CM | POA: Diagnosis not present

## 2021-01-02 DIAGNOSIS — E210 Primary hyperparathyroidism: Secondary | ICD-10-CM | POA: Diagnosis not present

## 2021-01-02 LAB — SARS CORONAVIRUS 2 (TAT 6-24 HRS): SARS Coronavirus 2: NEGATIVE

## 2021-01-03 ENCOUNTER — Encounter (HOSPITAL_COMMUNITY): Payer: Self-pay | Admitting: Surgery

## 2021-01-03 NOTE — H&P (Signed)
General Surgery [facility]  Elizabeth [name] DOB: [birth year] Married / Language: English / Race: Black or African American Female   History of Present Illness   The patient is a 60 year old female who presents with primary hyperparathyroidism.  CHIEF COMPLAINT: primary hyperparathyroidism  Patient is referred by Dr. [name] for surgical evaluation and management of primary hyperparathyroidism. Patient's primary care physician is Dr. [name]. Patient has a long-standing history of hyperthyroidism. She has been well managed on propylthiouracil and her most recent TSH level is normal at 1.74. Over the past 1-2 years she has been noted to have hypercalcemia. Recent laboratory studies show calcium levels as high as 11.7. Intact PTH level was obtained and was elevated as high as 120 in recently measured 96. Patient has mild vitamin D insufficiency with a level of 26. 24-hour urine collection for calcium has had variable results over the past year. In March 2021 measured 160. In February of this year it measured 36. Patient underwent an ultrasound examination of the neck on October 28, 2020. This demonstrated a mildly heterogeneous thyroid gland without significant nodules. However there was a hypoechoic nodule which appeared extrathyroidal in the right inferior position suspicious for an enlarged right inferior parathyroid gland and consistent with an adenoma. Patient is now referred to surgery for consideration for parathyroidectomy. She has not had a nuclear medicine parathyroid scan. She has had no prior surgery on the head or neck. There is no family history of parathyroid disease.  Diagnostic Studies History  Colonoscopy  1-5 years ago Mammogram  within last year Pap Smear  >5 years ago  Allergies  No Known Drug Allergies Allergies Reconciled  Codeine Sulfate *ANALGESICS - OPIOID*   Medication History Cetirizine HCl (10MG  Tablet,  Oral) Active. Cyclobenzaprine HCl (5MG  Tablet, Oral) Active. Edarbi (40MG  Tablet, Oral) Active. Furosemide (20MG  Tablet, Oral) Active. Methocarbamol (500MG  Tablet, Oral) Active. Losartan Potassium (100MG  Tablet, Oral) Active. Metoprolol Succinate ER (50MG  Tablet ER 24HR, Oral) Active. PARoxetine HCl (10MG  Tablet, Oral) Active. Spironolactone (50MG  Tablet, Oral) Active. Propranolol HCl (20MG /5ML Solution, Oral) Active. Medications Reconciled  Social History Alcohol use  Occasional alcohol use. Caffeine use  Carbonated beverages, Coffee, Tea. No drug use  Tobacco use  Former smoker.  Family History  Arthritis  Mother. Cancer  Father. Diabetes Mellitus  Brother. Heart Disease  Mother. Hypertension  Father, Mother.  Pregnancy / Birth History  Age at menarche  2 years. Age of menopause  30-50 Gravida  4 Maternal age  33-20 Para  3  Other Problems  Anxiety Disorder  Back Pain  Depression  High blood pressure  Thyroid Cancer  Thyroid Disease   Review of Systems  General Not Present- Appetite Loss, Chills, Fatigue, Fever, Night Sweats, Weight Gain and Weight Loss. Skin Not Present- Change in Wart/Mole, Dryness, Hives, Jaundice, New Lesions, Non-Healing Wounds, Rash and Ulcer. HEENT Present- Earache, Nose Bleed, Ringing in the Ears, Seasonal Allergies, Sinus Pain, Visual Disturbances and Wears glasses/contact lenses. Not Present- Hearing Loss, Hoarseness, Oral Ulcers, Sore Throat and Yellow Eyes. Respiratory Present- Snoring and Wheezing. Not Present- Bloody sputum, Chronic Cough and Difficulty Breathing. Breast Not Present- Breast Mass, Breast Pain, Nipple Discharge and Skin Changes. Cardiovascular Present- Leg Cramps and Palpitations. Not Present- Chest Pain, Difficulty Breathing Lying Down, Rapid Heart Rate, Shortness of Breath and Swelling of Extremities. Gastrointestinal Present- Bloating and Indigestion. Not Present- Abdominal Pain, Bloody  Stool, Change in Bowel Habits, Chronic diarrhea, Constipation, Difficulty Swallowing, Excessive gas, Gets full quickly  at meals, Hemorrhoids, Nausea, Rectal Pain and Vomiting. Female Genitourinary Not Present- Frequency, Nocturia, Painful Urination, Pelvic Pain and Urgency. Musculoskeletal Present- Back Pain, Joint Pain, Muscle Pain and Muscle Weakness. Not Present- Joint Stiffness and Swelling of Extremities. Neurological Present- Decreased Memory and Numbness. Not Present- Fainting, Headaches, Seizures, Tingling, Tremor, Trouble walking and Weakness. Psychiatric Present- Anxiety and Change in Sleep Pattern. Not Present- Bipolar, Depression, Fearful and Frequent crying. Endocrine Present- Cold Intolerance and Hair Changes. Not Present- Excessive Hunger, Heat Intolerance, Hot flashes and New Diabetes. Hematology Not Present- Blood Thinners, Easy Bruising, Excessive bleeding, Gland problems, HIV and Persistent Infections.  Vitals  Weight: 184.25 lb Height: 65in Body Surface Area: 1.91 m Body Mass Index: 30.66 kg/m  Temp.: 96.34F  Pulse: 70 (Regular)  P.OX: 90% (Room air) BP: 140/80(Sitting, Left Arm, Standard)  Physical Exam   GENERAL APPEARANCE Development: normal Nutritional status: normal Gross deformities: none  SKIN Rash, lesions, ulcers: none Induration, erythema: none Nodules: none palpable  EYES Conjunctiva and lids: normal Pupils: equal and reactive Iris: normal bilaterally  EARS, NOSE, MOUTH, THROAT External ears: no lesion or deformity External nose: no lesion or deformity Hearing: grossly normal Due to Covid-19 pandemic, patient is wearing a mask.  NECK Symmetric: yes Trachea: midline Thyroid: no palpable nodules in the thyroid bed  CHEST Respiratory effort: normal Retraction or accessory muscle use: no Breath sounds: normal bilaterally Rales, rhonchi, wheeze: none  CARDIOVASCULAR Auscultation: regular rhythm, normal rate Murmurs:  none Pulses: radial pulse 2+ palpable Lower extremity edema: none  MUSCULOSKELETAL Station and gait: normal Digits and nails: no clubbing or cyanosis Muscle strength: grossly normal all extremities Range of motion: grossly normal all extremities Deformity: none  LYMPHATIC Cervical: none palpable Supraclavicular: none palpable  PSYCHIATRIC Oriented to person, place, and time: yes Mood and affect: normal for situation Judgment and insight: appropriate for situation    Assessment & Plan  PRIMARY HYPERPARATHYROIDISM (E21.0) HYPERTHYROIDISM WITHOUT THYROID NODULE (E05.90)  Follow Up - Call [facility] office after tests / studies doneto discuss further plans  Patient is referred by her endocrinologist and her primary care physician for surgical evaluation of primary hyperparathyroidism.  Patient provided with a copy of "Parathyroid Surgery: Treatment for Your Parathyroid Gland Problem", published by [employer], 12 pages. Book reviewed and explained to patient during visit today.  Patient has biochemical evidence of primary hyperparathyroidism. Ultrasound has localized an enlarged nodule in the right inferior position consistent with a parathyroid adenoma. I would like to obtain a nuclear medicine parathyroid scan in hopes that it will confirm the location of the parathyroid adenoma and rule out multi-gland disease. If so I believe she will be a good candidate for minimally invasive surgery. I provided her with written literature today on parathyroid surgery. We discussed minimally invasive right inferior parathyroidectomy. We discussed the risk and benefits of that surgery including the potential for recurrent nerve injury and the potential for multi-gland disease. We discussed doing this as an outpatient surgical procedure. We discussed her recovery following surgery. We discussed the size and location of the surgical incision. She understands and agrees to proceed.  Patient will  undergo nuclear medicine parathyroid scan in the near future. We will contact her with the results and then move forward with scheduling her for surgery.    ADDENDUM  Telephone call to patient with results of sestamibi scan - right inferior adenoma. This correlates with the USN results.  Plan minimally invasive right inferior parathyroidectomy as an out-patient procedure.  The risks and benefits of  the procedure have been discussed at length with the patient. The patient understands the proposed procedure, potential alternative treatments, and the course of recovery to be expected. All of the patient's questions have been answered at this time. The patient wishes to proceed with surgery.  Armandina Gemma, [facility] Office: [phone]

## 2021-01-03 NOTE — Anesthesia Preprocedure Evaluation (Addendum)
Anesthesia Evaluation  Patient identified by MRN, date of birth, ID band Patient awake    Reviewed: Allergy & Precautions, NPO status , Patient's Chart, lab work & pertinent test results  Airway Mallampati: II  TM Distance: >3 FB Neck ROM: Full    Dental no notable dental hx.    Pulmonary neg pulmonary ROS, former smoker,    Pulmonary exam normal breath sounds clear to auscultation       Cardiovascular hypertension, Pt. on medications Normal cardiovascular exam Rhythm:Regular Rate:Normal     Neuro/Psych negative neurological ROS  negative psych ROS   GI/Hepatic Neg liver ROS, GERD  ,  Endo/Other  Hyperthyroidism   Renal/GU negative Renal ROS  negative genitourinary   Musculoskeletal negative musculoskeletal ROS (+)   Abdominal   Peds negative pediatric ROS (+)  Hematology negative hematology ROS (+)   Anesthesia Other Findings   Reproductive/Obstetrics negative OB ROS                            Anesthesia Physical Anesthesia Plan  ASA: II  Anesthesia Plan: General   Post-op Pain Management:    Induction: Intravenous  PONV Risk Score and Plan: 3 and Ondansetron, Dexamethasone, Treatment may vary due to age or medical condition and Midazolam  Airway Management Planned: Oral ETT  Additional Equipment:   Intra-op Plan:   Post-operative Plan: Extubation in OR  Informed Consent: I have reviewed the patients History and Physical, chart, labs and discussed the procedure including the risks, benefits and alternatives for the proposed anesthesia with the patient or authorized representative who has indicated his/her understanding and acceptance.     Dental advisory given  Plan Discussed with: CRNA and Surgeon  Anesthesia Plan Comments:         Anesthesia Quick Evaluation

## 2021-01-05 ENCOUNTER — Ambulatory Visit (HOSPITAL_COMMUNITY)
Admission: RE | Admit: 2021-01-05 | Discharge: 2021-01-05 | Disposition: A | Discharge status: Home / Self Care | Payer: BLUE CROSS/BLUE SHIELD | Attending: Surgery | Admitting: Surgery

## 2021-01-05 ENCOUNTER — Encounter (HOSPITAL_COMMUNITY)
Admission: RE | Disposition: A | Discharge status: Home / Self Care | Payer: Self-pay | Source: Home / Self Care | Attending: Surgery

## 2021-01-05 ENCOUNTER — Ambulatory Visit (HOSPITAL_COMMUNITY): Payer: BLUE CROSS/BLUE SHIELD | Admitting: Anesthesiology

## 2021-01-05 ENCOUNTER — Encounter (HOSPITAL_COMMUNITY): Payer: Self-pay | Admitting: Surgery

## 2021-01-05 DIAGNOSIS — D351 Benign neoplasm of parathyroid gland: Secondary | ICD-10-CM | POA: Diagnosis not present

## 2021-01-05 DIAGNOSIS — Z20822 Contact with and (suspected) exposure to covid-19: Secondary | ICD-10-CM | POA: Insufficient documentation

## 2021-01-05 DIAGNOSIS — E0590 Thyrotoxicosis, unspecified without thyrotoxic crisis or storm: Secondary | ICD-10-CM | POA: Insufficient documentation

## 2021-01-05 DIAGNOSIS — Z885 Allergy status to narcotic agent status: Secondary | ICD-10-CM | POA: Insufficient documentation

## 2021-01-05 DIAGNOSIS — Z79899 Other long term (current) drug therapy: Secondary | ICD-10-CM | POA: Diagnosis not present

## 2021-01-05 DIAGNOSIS — Z87891 Personal history of nicotine dependence: Secondary | ICD-10-CM | POA: Insufficient documentation

## 2021-01-05 DIAGNOSIS — E785 Hyperlipidemia, unspecified: Secondary | ICD-10-CM | POA: Diagnosis not present

## 2021-01-05 DIAGNOSIS — E210 Primary hyperparathyroidism: Secondary | ICD-10-CM | POA: Diagnosis not present

## 2021-01-05 DIAGNOSIS — E213 Hyperparathyroidism, unspecified: Secondary | ICD-10-CM | POA: Diagnosis present

## 2021-01-05 DIAGNOSIS — I10 Essential (primary) hypertension: Secondary | ICD-10-CM | POA: Diagnosis not present

## 2021-01-05 HISTORY — PX: PARATHYROIDECTOMY: SHX19

## 2021-01-05 SURGERY — PARATHYROIDECTOMY
Anesthesia: General | Site: Neck | Laterality: Right

## 2021-01-05 MED ORDER — FENTANYL CITRATE (PF) 250 MCG/5ML IJ SOLN
INTRAMUSCULAR | Status: AC
  Filled 2021-01-05: qty 5

## 2021-01-05 MED ORDER — LACTATED RINGERS IV SOLN: INTRAVENOUS | Status: DC

## 2021-01-05 MED ORDER — LIDOCAINE 2% (20 MG/ML) 5 ML SYRINGE
INTRAMUSCULAR | Status: DC | PRN
  Administered 2021-01-05: 100 mg via INTRAVENOUS

## 2021-01-05 MED ORDER — ONDANSETRON HCL 4 MG/2ML IJ SOLN
INTRAMUSCULAR | Status: DC | PRN
  Administered 2021-01-05: 4 mg via INTRAVENOUS

## 2021-01-05 MED ORDER — ONDANSETRON HCL 4 MG/2ML IJ SOLN: 4.0000 mg | Freq: Once | INTRAMUSCULAR | Status: DC | PRN

## 2021-01-05 MED ORDER — ONDANSETRON HCL 4 MG/2ML IJ SOLN
INTRAMUSCULAR | Status: AC
  Filled 2021-01-05: qty 2

## 2021-01-05 MED ORDER — BUPIVACAINE HCL 0.25 % IJ SOLN
INTRAMUSCULAR | Status: DC | PRN
  Administered 2021-01-05: 9 mL

## 2021-01-05 MED ORDER — SUGAMMADEX SODIUM 200 MG/2ML IV SOLN
INTRAVENOUS | Status: DC | PRN
  Administered 2021-01-05: 200 mg via INTRAVENOUS

## 2021-01-05 MED ORDER — DEXAMETHASONE SODIUM PHOSPHATE 10 MG/ML IJ SOLN
INTRAMUSCULAR | Status: DC | PRN
  Administered 2021-01-05: 8 mg via INTRAVENOUS

## 2021-01-05 MED ORDER — ACETAMINOPHEN 10 MG/ML IV SOLN
1000.0000 mg | Freq: Once | INTRAVENOUS | Status: DC | PRN
  Administered 2021-01-05: 1000 mg via INTRAVENOUS

## 2021-01-05 MED ORDER — CHLORHEXIDINE GLUCONATE 0.12 % MT SOLN
15.0000 mL | Freq: Once | OROMUCOSAL | Status: AC
  Administered 2021-01-05: 15 mL via OROMUCOSAL

## 2021-01-05 MED ORDER — ROCURONIUM BROMIDE 10 MG/ML (PF) SYRINGE
PREFILLED_SYRINGE | INTRAVENOUS | Status: DC | PRN
  Administered 2021-01-05: 60 mg via INTRAVENOUS

## 2021-01-05 MED ORDER — CEFAZOLIN SODIUM-DEXTROSE 2-4 GM/100ML-% IV SOLN
2.0000 g | INTRAVENOUS | Status: AC
  Administered 2021-01-05: 2 g via INTRAVENOUS
  Filled 2021-01-05: qty 100

## 2021-01-05 MED ORDER — ACETAMINOPHEN 10 MG/ML IV SOLN
INTRAVENOUS | Status: AC
  Filled 2021-01-05: qty 100

## 2021-01-05 MED ORDER — PROPOFOL 10 MG/ML IV BOLUS
INTRAVENOUS | Status: DC | PRN
  Administered 2021-01-05: 150 mg via INTRAVENOUS

## 2021-01-05 MED ORDER — HYDROMORPHONE HCL 1 MG/ML IJ SOLN
0.2500 mg | INTRAMUSCULAR | Status: DC | PRN
  Administered 2021-01-05: 0.5 mg via INTRAVENOUS

## 2021-01-05 MED ORDER — MIDAZOLAM HCL 5 MG/5ML IJ SOLN
INTRAMUSCULAR | Status: DC | PRN
  Administered 2021-01-05: 2 mg via INTRAVENOUS

## 2021-01-05 MED ORDER — CHLORHEXIDINE GLUCONATE CLOTH 2 % EX PADS: 6.0000 | MEDICATED_PAD | Freq: Once | CUTANEOUS | Status: DC

## 2021-01-05 MED ORDER — PROPOFOL 10 MG/ML IV BOLUS
INTRAVENOUS | Status: AC
  Filled 2021-01-05: qty 20

## 2021-01-05 MED ORDER — FENTANYL CITRATE (PF) 100 MCG/2ML IJ SOLN
INTRAMUSCULAR | Status: DC | PRN
  Administered 2021-01-05 (×2): 50 ug via INTRAVENOUS

## 2021-01-05 MED ORDER — DEXAMETHASONE SODIUM PHOSPHATE 10 MG/ML IJ SOLN
INTRAMUSCULAR | Status: AC
  Filled 2021-01-05: qty 1

## 2021-01-05 MED ORDER — HYDROMORPHONE HCL 1 MG/ML IJ SOLN
INTRAMUSCULAR | Status: AC
  Administered 2021-01-05: 0.5 mg via INTRAVENOUS
  Filled 2021-01-05: qty 1

## 2021-01-05 MED ORDER — PHENYLEPHRINE HCL (PRESSORS) 10 MG/ML IV SOLN
INTRAVENOUS | Status: AC
  Filled 2021-01-05: qty 2

## 2021-01-05 MED ORDER — ROCURONIUM BROMIDE 10 MG/ML (PF) SYRINGE
PREFILLED_SYRINGE | INTRAVENOUS | Status: AC
  Filled 2021-01-05: qty 10

## 2021-01-05 MED ORDER — LIDOCAINE 2% (20 MG/ML) 5 ML SYRINGE
INTRAMUSCULAR | Status: AC
  Filled 2021-01-05: qty 5

## 2021-01-05 MED ORDER — MIDAZOLAM HCL 2 MG/2ML IJ SOLN
INTRAMUSCULAR | Status: AC
  Filled 2021-01-05: qty 2

## 2021-01-05 MED ORDER — HYDROCODONE-ACETAMINOPHEN 5-325 MG PO TABS: 1.0000 | ORAL_TABLET | Freq: Four times a day (QID) | ORAL | 0 refills | Status: DC | PRN

## 2021-01-05 MED ORDER — ORAL CARE MOUTH RINSE
15.0000 mL | Freq: Once | OROMUCOSAL | Status: AC
Start: 2021-01-05 — End: 2021-01-05

## 2021-01-05 MED ORDER — BUPIVACAINE HCL 0.25 % IJ SOLN
INTRAMUSCULAR | Status: AC
  Filled 2021-01-05: qty 1

## 2021-01-05 SURGICAL SUPPLY — 30 items
ATTRACTOMAT 16X20 MAGNETIC DRP (DRAPES) ×2 IMPLANT
BLADE SURG 15 STRL LF DISP TIS (BLADE) ×1 IMPLANT
BLADE SURG 15 STRL SS (BLADE) ×1
CHLORAPREP W/TINT 26 (MISCELLANEOUS) ×2 IMPLANT
CLIP VESOCCLUDE MED 6/CT (CLIP) ×2 IMPLANT
CLIP VESOCCLUDE SM WIDE 6/CT (CLIP) ×4 IMPLANT
COVER SURGICAL LIGHT HANDLE (MISCELLANEOUS) ×2 IMPLANT
COVER WAND RF STERILE (DRAPES) IMPLANT
DERMABOND ADVANCED (GAUZE/BANDAGES/DRESSINGS) ×1
DERMABOND ADVANCED .7 DNX12 (GAUZE/BANDAGES/DRESSINGS) ×1 IMPLANT
DRAPE LAPAROTOMY T 98X78 PEDS (DRAPES) ×2 IMPLANT
DRAPE UTILITY XL STRL (DRAPES) ×2 IMPLANT
ELECT REM PT RETURN 15FT ADLT (MISCELLANEOUS) ×2 IMPLANT
GAUZE 4X4 16PLY RFD (DISPOSABLE) ×2 IMPLANT
GLOVE SURG ORTHO LTX SZ8 (GLOVE) ×2 IMPLANT
GOWN STRL REUS W/TWL XL LVL3 (GOWN DISPOSABLE) ×4 IMPLANT
HEMOSTAT SURGICEL 2X4 FIBR (HEMOSTASIS) ×2 IMPLANT
ILLUMINATOR WAVEGUIDE N/F (MISCELLANEOUS) IMPLANT
KIT BASIN OR (CUSTOM PROCEDURE TRAY) ×2 IMPLANT
KIT TURNOVER KIT A (KITS) ×2 IMPLANT
NEEDLE HYPO 25X1 1.5 SAFETY (NEEDLE) ×2 IMPLANT
PACK BASIC VI WITH GOWN DISP (CUSTOM PROCEDURE TRAY) ×2 IMPLANT
PENCIL SMOKE EVACUATOR (MISCELLANEOUS) ×2 IMPLANT
SUT MNCRL AB 4-0 PS2 18 (SUTURE) ×2 IMPLANT
SUT VIC AB 3-0 SH 18 (SUTURE) ×2 IMPLANT
SYR BULB IRRIG 60ML STRL (SYRINGE) ×2 IMPLANT
SYR CONTROL 10ML LL (SYRINGE) ×2 IMPLANT
TOWEL OR 17X26 10 PK STRL BLUE (TOWEL DISPOSABLE) ×2 IMPLANT
TOWEL OR NON WOVEN STRL DISP B (DISPOSABLE) ×2 IMPLANT
TUBING CONNECTING 10 (TUBING) ×2 IMPLANT

## 2021-01-05 NOTE — Discharge Instructions (Signed)
[facility]  THYROID & PARATHYROID SURGERY:  POST-OP INSTRUCTIONS  Always review your discharge instruction sheet from the facility where your surgery was performed.  A prescription for pain medication may be given to you upon discharge.  Take your pain medication as prescribed.  If narcotic pain medicine is not needed, then you may take acetaminophen (Tylenol) or ibuprofen (Advil) as needed.  Take your usually prescribed medications unless otherwise directed.  If you need a refill on your pain medication, please contact our office during regular business hours.  Prescriptions cannot be processed by our office after 5 pm or on weekends.  Start with a light diet upon arrival home, such as soup and crackers or toast.  Be sure to drink plenty of fluids daily.  Resume your normal diet the day after surgery.  Most patients will experience some swelling and bruising on the chest and neck area.  Ice packs will help.  Swelling and bruising can take several days to resolve.   It is common to experience some constipation after surgery.  Increasing fluid intake and taking a stool softener (Colace) will usually help or prevent this problem.  A mild laxative (Milk of Magnesia or Miralax) should be taken according to package directions if there has been no bowel movement after 48 hours.  You have steri-strips and a gauze dressing over your incision.  You may remove the gauze bandage on the second day after surgery, and you may shower at that time.  Leave your steri-strips (small skin tapes) in place directly over the incision.  These strips should remain on the skin for 5-7 days and then be removed.  You may get them wet in the shower and pat them dry.  You may resume regular (light) daily activities beginning the next day (such as daily self-care, walking, climbing stairs) gradually increasing activities as tolerated.  You may have sexual intercourse when it is comfortable.  Refrain from  any heavy lifting or straining until approved by your doctor.  You may drive when you no longer are taking prescription pain medication, you can comfortably wear a seatbelt, and you can safely maneuver your car and apply brakes.  You should see your doctor in the office for a follow-up appointment approximately three weeks after your surgery.  Make sure that you call for this appointment within a day or two after you arrive home to insure a convenient appointment time.  WHEN TO CALL YOUR DOCTOR: -- Fever greater than 101.5 -- Inability to urinate -- Nausea and/or vomiting - persistent -- Extreme swelling or bruising -- Continued bleeding from incision -- Increased pain, redness, or drainage from the incision -- Difficulty swallowing or breathing -- Muscle cramping or spasms -- Numbness or tingling in hands or around lips  The clinic staff is available to answer your questions during regular business hours.  Please don't hesitate to call and ask to speak to one of the nurses if you have concerns.  Rubi Tooley, MD [facility] Office: [phone] 

## 2021-01-05 NOTE — Interval H&P Note (Signed)
History and Physical Interval Note:  01/05/2021 6:58 AM  Elizabeth [name]  has presented today for surgery, with the diagnosis of primary hyperparathyroidism.  The various methods of treatment have been discussed with the patient and family. After consideration of risks, benefits and other options for treatment, the patient has consented to    Procedure(s): RIGHT INFERIOR PARATHYROIDECTOMY (Right) as a surgical intervention.    The patient's history has been reviewed, patient examined, no change in status, stable for surgery.  I have reviewed the patient's chart and labs.  Questions were answered to the patient's satisfaction.    Armandina Gemma, MD [facility] Office: [place]

## 2021-01-05 NOTE — Anesthesia Procedure Notes (Signed)
Procedure Name: Intubation Date/Time: 01/05/2021 7:36 AM Performed by: Victoriano Lain, CRNA Pre-anesthesia Checklist: Patient identified, Emergency Drugs available, Suction available, Patient being monitored and Timeout performed Patient Re-evaluated:Patient Re-evaluated prior to induction Oxygen Delivery Method: Circle system utilized Preoxygenation: Pre-oxygenation with 100% oxygen Induction Type: IV induction Ventilation: Mask ventilation without difficulty Laryngoscope Size: Mac and 4 Grade View: Grade I Tube type: Oral Tube size: 7.5 mm Number of attempts: 1 Airway Equipment and Method: Stylet Placement Confirmation: ETT inserted through vocal cords under direct vision,  positive ETCO2 and breath sounds checked- equal and bilateral Secured at: 21 cm Tube secured with: Tape Dental Injury: Teeth and Oropharynx as per pre-operative assessment

## 2021-01-05 NOTE — Transfer of Care (Signed)
Immediate Anesthesia Transfer of Care Note  Patient: Elizabeth [name]  Procedure(s) Performed: RIGHT INFERIOR PARATHYROIDECTOMY (Right Neck)  Patient Location: PACU  Anesthesia Type:General  Level of Consciousness: awake, alert , oriented and patient cooperative  Airway & Oxygen Therapy: Patient Spontanous Breathing and Patient connected to face mask oxygen  Post-op Assessment: Report given to RN, Post -op Vital signs reviewed and stable and Patient moving all extremities  Post vital signs: Reviewed and stable  Last Vitals:  Vitals Value Taken Time  BP    Temp    Pulse 59 01/05/21 0851  Resp 11 01/05/21 0851  SpO2 100 % 01/05/21 0851  Vitals shown include unvalidated device data.  Last Pain:  Vitals:   01/05/21 0541  TempSrc:   PainSc: 7       Patients Stated Pain Goal: 6 (96/75/91 6384)  Complications: No complications documented.

## 2021-01-05 NOTE — Anesthesia Postprocedure Evaluation (Signed)
Anesthesia Post Note  Patient: Elizabeth [name]  Procedure(s) Performed: RIGHT INFERIOR PARATHYROIDECTOMY (Right Neck)     Patient location during evaluation: PACU Anesthesia Type: General Level of consciousness: awake and alert Pain management: pain level controlled Vital Signs Assessment: post-procedure vital signs reviewed and stable Respiratory status: spontaneous breathing, nonlabored ventilation, respiratory function stable and patient connected to nasal cannula oxygen Cardiovascular status: blood pressure returned to baseline and stable Postop Assessment: no apparent nausea or vomiting Anesthetic complications: no   No complications documented.  Last Vitals:  Vitals:   01/05/21 1004 01/05/21 1050  BP: (!) 186/85 (!) 145/76  Pulse: (!) 52 (!) 51  Resp: 16 16  Temp: 36.6 C 36.6 C  SpO2: 98% 96%    Last Pain:  Vitals:   01/05/21 1050  TempSrc:   PainSc: 0-No pain                 Tricia Oaxaca S

## 2021-01-05 NOTE — Op Note (Signed)
OPERATIVE REPORT - PARATHYROIDECTOMY  Preoperative diagnosis: Primary hyperparathyroidism  Postop diagnosis: Same  Procedure: Right inferior minimally invasive parathyroidectomy  Surgeon:  Armandina Gemma, MD  Assistant:  Carlena Hurl, PA-C  Anesthesia: General endotracheal  Estimated blood loss: Minimal  Preparation: ChloraPrep  Indications: Patient is referred by Dr. [name] for surgical evaluation and management of primary hyperparathyroidism. Patient's primary care physician is Dr. [name]. Patient has a long-standing history of hyperthyroidism. She has been well managed on propylthiouracil and her most recent TSH level is normal at 1.74. Over the past 1-2 years she has been noted to have hypercalcemia. Recent laboratory studies show calcium levels as high as 11.7. Intact PTH level was obtained and was elevated as high as 120 in recently measured 96. Patient has mild vitamin D insufficiency with a level of 26. 24-hour urine collection for calcium has had variable results over the past year. In March 2021 measured 160. In February of this year it measured 36. Patient underwent an ultrasound examination of the neck on October 28, 2020. This demonstrated a mildly heterogeneous thyroid gland without significant nodules. However there was a hypoechoic nodule which appeared extrathyroidal in the right inferior position suspicious for an enlarged right inferior parathyroid gland and consistent with an adenoma. Patient is now referred to surgery for consideration for parathyroidectomy.   Procedure: The patient was prepared in the pre-operative holding area. The patient was brought to the operating room and placed in a supine position on the operating room table. Following administration of general anesthesia, the patient was positioned and then prepped and draped in the usual strict aseptic fashion. After ascertaining that an adequate level of anesthesia been achieved, a neck  incision was made with a #15 blade. Dissection was carried through subcutaneous tissues and platysma. Hemostasis was obtained with the electrocautery. Skin flaps were developed circumferentially and a Weitlander retractor was placed for exposure.  Strap muscles were incised in the midline. Strap muscles were reflected laterally exposing the thyroid lobe. With gentle blunt dissection the thyroid lobe was mobilized.  Dissection was carried through adipose tissue and an enlarged parathyroid gland was identified. It was gently mobilized. Vascular structures were divided between small ligaclips. Care was taken to avoid the recurrent laryngeal nerve and the esophagus, both of which were clearly visualized. The parathyroid gland was completely excised. It was submitted to pathology where frozen section by Dr. Claudette Laws confirmed parathyroid tissue consistent with adenoma.  Neck was irrigated with warm saline and good hemostasis was noted. Fibrillar was placed in the operative field. Strap muscles were approximated in the midline with interrupted 3-0 Vicryl sutures. Platysma was closed with interrupted 3-0 Vicryl sutures. Marcaine was infiltrated circumferentially. Skin was closed with a running 4-0 Monocryl subcuticular suture. Wound was washed and dried and Dermabond was applied. Patient was awakened from anesthesia and brought to the recovery room. The patient tolerated the procedure well.   Armandina Gemma, MD [facility] Office: [phone]

## 2021-01-06 ENCOUNTER — Encounter: Payer: Self-pay | Admitting: Internal Medicine

## 2021-01-06 ENCOUNTER — Encounter (HOSPITAL_COMMUNITY): Payer: Self-pay | Admitting: Surgery

## 2021-01-06 LAB — SURGICAL PATHOLOGY

## 2021-01-07 ENCOUNTER — Encounter: Payer: Self-pay | Admitting: Orthopedic Surgery

## 2021-01-08 ENCOUNTER — Telehealth: Payer: Self-pay | Admitting: Internal Medicine

## 2021-01-08 NOTE — Telephone Encounter (Signed)
Pt requests a call regarding her Thyroid Surgery she had on Monday. Pt states that since her surgery she has been having problems with her throat such as coughing up large amounts of mucus and shortness of breath which she says is making it hard for her to talk. Pt would like to know if that could have to do with her surgury?   Callback # [phone]

## 2021-01-08 NOTE — Telephone Encounter (Signed)
Spoken to patient and notified Dr Shamleffer's comments. Verbalized understanding.   

## 2021-01-08 NOTE — Telephone Encounter (Signed)
Needs to contact surgeon ASAP. As it could

## 2021-01-14 ENCOUNTER — Ambulatory Visit: Payer: BLUE CROSS/BLUE SHIELD | Admitting: Internal Medicine

## 2021-01-14 NOTE — Progress Notes (Deleted)
Name: Elizabeth [name]  MRN/ DOB: [id number], 1961-02-25    Age/ Sex: 60 y.o., female     PCP: Elizabeth Mc, MD   Reason for Endocrinology Evaluation: Hyperthyroidism/ Hypercalcemia      Initial Endocrinology Clinic Visit: 09/06/2019    PATIENT IDENTIFIER: Elizabeth [name] is a 60 y.o., female with a past medical history of HTN, Hyperthyroidism and Hypercalcemia . She has followed with [facility] since 09/06/2019 for consultative assistance with management of her Hypercalcemia and hyperthyroidism.   HISTORICAL SUMMARY: The patient was first diagnosed with hypercalcemia many years ago.  Ca/Cr ratio 0.013 in 11/2019 which is inconclusive.  DXA 09/25/2019 - normal results.   She is S/P right inferior parathyroidectomy 01/05/2021  THYROID HISTORY :  Has been diagnosed with hyperthyroidism in early 2000's. She has been on PTU since her diagnosis.    HTN: Renin was normal 0.464 ng/Ml/hr with normal aldosterone level at 9.1 ng/dL  In 07/2020. Prior to that she had inconclusive testing but confirmed normal by 07/2020 with normal serum potassium.   SUBJECTIVE:     Today (01/14/2021):  Elizabeth [name] is here for Hypercalcemia and hyperthyroidism.    Denies polyuria or polydipsia  Denies fever, constipation or diarrhea    Vitamin D3 2000 iu daily - not taking  PTU 50 mg daily   She is S/P right inferior parathyroidectomy 01/05/2021    HISTORY:  Past Medical History:  Past Medical History:  Diagnosis Date  . Anxiety   . Arthritis   . Complication of anesthesia    Slow to wake up  . Dental bridge present    permanent upper  . GERD (gastroesophageal reflux disease)   . Gout   . Hypertension   . Hyperthyroidism   . Thyroid disease   . Vertigo    Past Surgical History:  Past Surgical History:  Procedure Laterality Date  . ENDOMETRIAL ABLATION  2010  . PARATHYROIDECTOMY Right 01/05/2021   Procedure: RIGHT INFERIOR PARATHYROIDECTOMY;  Surgeon: Elizabeth Gemma,  MD;  Location: [facility];  Service: General;  Laterality: Right;  . WRIST SURGERY Right     Social History:  reports that she quit smoking about 22 years ago. She has never used smokeless tobacco. She reports current alcohol use. She reports that she does not use drugs. Family History:  Family History  Problem Relation Age of Onset  . Hypertension Mother   . Heart disease Mother   . Heart failure Mother   . Hypertension Father      HOME MEDICATIONS: Allergies as of 01/14/2021      Reactions   Codeine    Groggy   Metoprolol    SOB   Tramadol Other (See Comments)   Made her a zombie      Medication List       Accurate as of Jan 14, 2021  7:28 AM. If you have any questions, ask your nurse or doctor.        Cholecalciferol 25 MCG (1000 UT) tablet Take 1,000 Units by mouth daily.   cyclobenzaprine 5 MG tablet Commonly known as: FLEXERIL Take 1 tablet (5 mg total) by mouth at bedtime as needed for muscle spasms.   FLUTICASONE PROPIONATE NA Place 1-2 sprays into the nose 2 (two) times daily as needed (allergies).   gabapentin 300 MG capsule Commonly known as: NEURONTIN Take 300 mg by mouth daily as needed (neurpoathy).   HYDROcodone-acetaminophen 5-325 MG tablet Commonly known as: NORCO/VICODIN Take 1-2 tablets by mouth every  6 (six) hours as needed for moderate pain.   losartan 100 MG tablet Commonly known as: COZAAR Take 1 tablet (100 mg total) by mouth daily.   meloxicam 15 MG tablet Commonly known as: MOBIC Take 1 tablet (15 mg total) by mouth daily. What changed:   when to take this  reasons to take this   PARoxetine 20 MG tablet Commonly known as: Paxil Take 1 tablet (20 mg total) by mouth daily. With dinner   propranolol 20 MG tablet Commonly known as: INDERAL TAKE 1 TABLET(20 MG) BY MOUTH THREE TIMES DAILY What changed: See the new instructions.   propylthiouracil 50 MG tablet Commonly known as: PTU TAKE 1 TABLET(50 MG) BY MOUTH THREE TIMES  DAILY What changed: See the new instructions.   spironolactone 50 MG tablet Commonly known as: ALDACTONE TAKE 1/2 TABLET BY MOUTH DAILY FOR 3 DAYS, THEN INCREASE TO 1 TABLET IF BLOOD PRESSURE>130/80 DAY 4 IN THE AM What changed: See the new instructions.         OBJECTIVE:   PHYSICAL EXAM: VS: There were no vitals taken for this visit.   EXAM: General: Pt appears well and is in NAD  Neck: General: Supple without adenopathy. Thyroid: Thyroid size normal.  No goiter or nodules appreciated.   Lungs: Clear with good BS bilat with no rales, rhonchi, or wheezes  Heart: Auscultation: RRR.  Abdomen: Normoactive bowel sounds, soft, nontender, without masses or organomegaly palpable  Extremities:  BL LE: No pretibial edema normal ROM and strength.  Neuro: Cranial nerves: II - XII grossly intact  Motor: Normal strength throughout DTRs: 2+ and symmetric in UE without delay in relaxation phase     DATA REVIEWED:  Results for Elizabeth, [name] (MRN [id number]) as of 10/17/2020 12:27  Ref. Range 10/16/2020 10:33  Sodium Latest Ref Range: 135 - 145 mEq/L 138  Potassium Latest Ref Range: 3.5 - 5.1 mEq/L 4.1  Chloride Latest Ref Range: 96 - 112 mEq/L 105  CO2 Latest Ref Range: 19 - 32 mEq/L 27  Glucose Latest Ref Range: 70 - 99 mg/dL 96  BUN Latest Ref Range: 6 - 23 mg/dL 20  Creatinine Latest Ref Range: 0.40 - 1.20 mg/dL 1.19  Calcium Latest Ref Range: 8.4 - 10.5 mg/dL 11.7 (H)  Albumin Latest Ref Range: 3.5 - 5.2 g/dL 4.0  GFR Latest Ref Range: >60.00 mL/min 49.69 (L)  VITD Latest Ref Range: 30.00 - 100.00 ng/mL 26.45 (L)  PTH, Intact Latest Ref Range: 14 - 64 pg/mL 96 (H)  TSH Latest Ref Range: 0.35 - 4.50 uIU/mL 1.74  T4,Free(Direct) Latest Ref Range: 0.60 - 1.60 ng/dL 1.49     ASSESSMENT / PLAN / RECOMMENDATIONS:   1. Hyperthyroidism:   - Clinically she is euthyroid  - D/D graves' disease vs toxic thyroid nodule (s) - We had discussed risks/benefits of PTU vs Methimazole  but she opted to stay with PTU - Will proceed with thyroid ultrasound   Medications : Continue PTU 50 mg daily    2. Hyperparathyroidism:  - Pt asymptomatic - 24- hr urine  Ca/Cr ratio 0.013 in 11/2019 which is inconclusive, but most likely Primary Hyperparathyroidism - DXA normal 09/2019 - She was encouraged to stay hydrated, avoid OTC calcium tablets but to consume 2-3 servings of calcium daily.  - She was advised to restart Vitamin D  - Given elevated serum calcium at 11.7 mg/dL and deteriorating GFR, will proceed with surgical evaluation.      F/U in 3 months   Addendum:  Discussed referral for parathyroidectomy 10/17/2020 at 1630  Signed electronically by: Mack Guise, MD  [facility]  [facility] [address] [place], [place] [address] Phone: [phone] FAX: [phone]      CC: Elizabeth Mc, MD [place] [address] Phone: [phone]  Fax: [phone]   Return to Endocrinology clinic as below: Future Appointments  Date Time Provider [place]  01/14/2021  9:30 AM Evadean Sproule, Melanie Crazier, MD [facility] None  02/05/2021  9:00 AM Elizabeth Mc, MD [facility]

## 2021-01-16 ENCOUNTER — Ambulatory Visit
Admission: RE | Admit: 2021-01-16 | Discharge: 2021-01-16 | Disposition: A | Discharge status: Home / Self Care | Payer: BLUE CROSS/BLUE SHIELD | Source: Ambulatory Visit | Attending: Orthopedic Surgery | Admitting: Orthopedic Surgery

## 2021-01-16 ENCOUNTER — Ambulatory Visit: Payer: BLUE CROSS/BLUE SHIELD | Admitting: Anesthesiology

## 2021-01-16 ENCOUNTER — Encounter: Payer: Self-pay | Admitting: Orthopedic Surgery

## 2021-01-16 ENCOUNTER — Other Ambulatory Visit: Payer: Self-pay

## 2021-01-16 ENCOUNTER — Encounter
Admission: RE | Disposition: A | Discharge status: Home / Self Care | Payer: Self-pay | Source: Ambulatory Visit | Attending: Orthopedic Surgery

## 2021-01-16 DIAGNOSIS — S46011A Strain of muscle(s) and tendon(s) of the rotator cuff of right shoulder, initial encounter: Secondary | ICD-10-CM | POA: Diagnosis not present

## 2021-01-16 DIAGNOSIS — M7521 Bicipital tendinitis, right shoulder: Secondary | ICD-10-CM | POA: Diagnosis not present

## 2021-01-16 DIAGNOSIS — Z87891 Personal history of nicotine dependence: Secondary | ICD-10-CM | POA: Diagnosis not present

## 2021-01-16 DIAGNOSIS — Z79899 Other long term (current) drug therapy: Secondary | ICD-10-CM | POA: Insufficient documentation

## 2021-01-16 DIAGNOSIS — W010XXA Fall on same level from slipping, tripping and stumbling without subsequent striking against object, initial encounter: Secondary | ICD-10-CM | POA: Diagnosis not present

## 2021-01-16 DIAGNOSIS — M7541 Impingement syndrome of right shoulder: Secondary | ICD-10-CM | POA: Diagnosis not present

## 2021-01-16 DIAGNOSIS — M25811 Other specified joint disorders, right shoulder: Secondary | ICD-10-CM | POA: Insufficient documentation

## 2021-01-16 DIAGNOSIS — M7581 Other shoulder lesions, right shoulder: Secondary | ICD-10-CM | POA: Insufficient documentation

## 2021-01-16 DIAGNOSIS — M25511 Pain in right shoulder: Secondary | ICD-10-CM | POA: Diagnosis not present

## 2021-01-16 DIAGNOSIS — M65811 Other synovitis and tenosynovitis, right shoulder: Secondary | ICD-10-CM | POA: Insufficient documentation

## 2021-01-16 DIAGNOSIS — M75111 Incomplete rotator cuff tear or rupture of right shoulder, not specified as traumatic: Secondary | ICD-10-CM | POA: Diagnosis not present

## 2021-01-16 DIAGNOSIS — G8918 Other acute postprocedural pain: Secondary | ICD-10-CM | POA: Diagnosis not present

## 2021-01-16 HISTORY — DX: Presence of dental prosthetic device (complete) (partial): Z97.2

## 2021-01-16 HISTORY — PX: SHOULDER ARTHROSCOPY WITH ROTATOR CUFF REPAIR AND SUBACROMIAL DECOMPRESSION: SHX5686

## 2021-01-16 SURGERY — SHOULDER ARTHROSCOPY WITH ROTATOR CUFF REPAIR AND SUBACROMIAL DECOMPRESSION
Anesthesia: General | Site: Shoulder | Laterality: Right

## 2021-01-16 MED ORDER — ONDANSETRON 4 MG PO TBDP: 4.0000 mg | ORAL_TABLET | Freq: Three times a day (TID) | ORAL | 0 refills | Status: DC | PRN

## 2021-01-16 MED ORDER — MIDAZOLAM HCL 2 MG/2ML IJ SOLN
INTRAMUSCULAR | Status: DC | PRN
  Administered 2021-01-16: 2 mg via INTRAVENOUS

## 2021-01-16 MED ORDER — LIDOCAINE HCL (CARDIAC) PF 100 MG/5ML IV SOSY
PREFILLED_SYRINGE | INTRAVENOUS | Status: DC | PRN
  Administered 2021-01-16: 50 mg via INTRAVENOUS

## 2021-01-16 MED ORDER — GLYCOPYRROLATE 0.2 MG/ML IJ SOLN
INTRAMUSCULAR | Status: DC | PRN
  Administered 2021-01-16: .1 mg via INTRAVENOUS

## 2021-01-16 MED ORDER — BUPIVACAINE LIPOSOME 1.3 % IJ SUSP
INTRAMUSCULAR | Status: DC | PRN
  Administered 2021-01-16: 20 mL

## 2021-01-16 MED ORDER — ONDANSETRON HCL 4 MG/2ML IJ SOLN
INTRAMUSCULAR | Status: DC | PRN
  Administered 2021-01-16: 4 mg via INTRAVENOUS

## 2021-01-16 MED ORDER — OXYCODONE HCL 5 MG PO TABS: 5.0000 mg | ORAL_TABLET | ORAL | 0 refills | Status: DC | PRN

## 2021-01-16 MED ORDER — LACTATED RINGERS IV SOLN
INTRAVENOUS | Status: DC | PRN
  Administered 2021-01-16: 12000 mL

## 2021-01-16 MED ORDER — ASPIRIN EC 325 MG PO TBEC: 325.0000 mg | DELAYED_RELEASE_TABLET | Freq: Every day | ORAL | 0 refills | Status: AC

## 2021-01-16 MED ORDER — ACETAMINOPHEN 500 MG PO TABS: 1000.0000 mg | ORAL_TABLET | Freq: Three times a day (TID) | ORAL | 2 refills | Status: AC

## 2021-01-16 MED ORDER — LACTATED RINGERS IV SOLN: INTRAVENOUS | Status: DC | PRN

## 2021-01-16 MED ORDER — CEFAZOLIN SODIUM-DEXTROSE 2-4 GM/100ML-% IV SOLN
2.0000 g | INTRAVENOUS | Status: AC
  Administered 2021-01-16: 2 g via INTRAVENOUS

## 2021-01-16 MED ORDER — DEXAMETHASONE SODIUM PHOSPHATE 10 MG/ML IJ SOLN
INTRAMUSCULAR | Status: DC | PRN
  Administered 2021-01-16: 4 mg via INTRAVENOUS

## 2021-01-16 MED ORDER — BUPIVACAINE-EPINEPHRINE (PF) 0.5% -1:200000 IJ SOLN
INTRAMUSCULAR | Status: DC | PRN
  Administered 2021-01-16: 20 mL

## 2021-01-16 MED ORDER — FENTANYL CITRATE (PF) 100 MCG/2ML IJ SOLN
INTRAMUSCULAR | Status: DC | PRN
  Administered 2021-01-16: 50 ug via INTRAVENOUS

## 2021-01-16 MED ORDER — PROPOFOL 500 MG/50ML IV EMUL
INTRAVENOUS | Status: DC | PRN
  Administered 2021-01-16: 75 ug/kg/min via INTRAVENOUS

## 2021-01-16 SURGICAL SUPPLY — 71 items
ADAPTER IRRIG TUBE 2 SPIKE SOL (ADAPTER) ×4 IMPLANT
ADH SKN CLS APL DERMABOND .7 (GAUZE/BANDAGES/DRESSINGS)
ADPR TBG 2 SPK PMP STRL ASCP (ADAPTER) ×2
ANCH SUT 2 SWLK 19.1 CLS EYLT (Anchor) ×1 IMPLANT
ANCH SUT 2X2.3 TAPE (Anchor) ×1 IMPLANT
ANCH SUT SHRT 12.5 CANN EYLT (Anchor) ×1 IMPLANT
ANCH SUT SWLK 19.1X4.75 (Anchor) ×1 IMPLANT
ANCHOR 2.3 SP SGL 1.2 XBRAID (Anchor) ×1 IMPLANT
ANCHOR SUT BIO SW 4.75X19.1 (Anchor) ×1 IMPLANT
ANCHOR SUT BIOCOMP LK 2.9X12.5 (Anchor) ×1 IMPLANT
ANCHOR SWIVELOCK BIO 4.75X19.1 (Anchor) ×1 IMPLANT
APL PRP STRL LF DISP 70% ISPRP (MISCELLANEOUS) ×2
BUR BR 5.5 12 FLUTE (BURR) ×2 IMPLANT
BUR RADIUS 4.0X18.5 (BURR) ×2 IMPLANT
CANNULA PART THRD DISP 5.75X7 (CANNULA) ×2 IMPLANT
CANNULA PARTIAL THREAD 2X7 (CANNULA) ×2 IMPLANT
CANNULA TWIST IN 8.25X7CM (CANNULA) ×1 IMPLANT
CHLORAPREP W/TINT 26 (MISCELLANEOUS) ×3 IMPLANT
COOLER POLAR GLACIER W/PUMP (MISCELLANEOUS) ×2 IMPLANT
COVER LIGHT HANDLE UNIVERSAL (MISCELLANEOUS) ×4 IMPLANT
DERMABOND ADVANCED (GAUZE/BANDAGES/DRESSINGS)
DERMABOND ADVANCED .7 DNX12 (GAUZE/BANDAGES/DRESSINGS) ×1 IMPLANT
DRAPE IMP U-DRAPE 54X76 (DRAPES) ×3 IMPLANT
DRAPE INCISE IOBAN 66X45 STRL (DRAPES) ×2 IMPLANT
DRAPE U-SHAPE 48X52 POLY STRL (PACKS) ×4 IMPLANT
DRSG TEGADERM 4X4.75 (GAUZE/BANDAGES/DRESSINGS) ×10 IMPLANT
ELECT REM PT RETURN 9FT ADLT (ELECTROSURGICAL) ×2
ELECTRODE REM PT RTRN 9FT ADLT (ELECTROSURGICAL) ×1 IMPLANT
GAUZE SPONGE 4X4 12PLY STRL (GAUZE/BANDAGES/DRESSINGS) ×2 IMPLANT
GAUZE XEROFORM 1X8 LF (GAUZE/BANDAGES/DRESSINGS) ×2 IMPLANT
GLOVE SRG 8 PF TXTR STRL LF DI (GLOVE) ×2 IMPLANT
GLOVE SURG ENC MOIS LTX SZ7.5 (GLOVE) ×4 IMPLANT
GLOVE SURG UNDER POLY LF SZ8 (GLOVE) ×4
GOWN STRL REIN 2XL XLG LVL4 (GOWN DISPOSABLE) ×2 IMPLANT
GOWN STRL REUS W/ TWL LRG LVL3 (GOWN DISPOSABLE) ×1 IMPLANT
GOWN STRL REUS W/TWL LRG LVL3 (GOWN DISPOSABLE) ×2
IV LACTATED RINGER IRRG 3000ML (IV SOLUTION) ×16
IV LR IRRIG 3000ML ARTHROMATIC (IV SOLUTION) ×8 IMPLANT
KIT DISPOSABLE PUSHLOCK 2.9MM (KITS) ×1 IMPLANT
KIT STABILIZATION SHOULDER (MISCELLANEOUS) ×2 IMPLANT
KIT TURNOVER KIT A (KITS) ×2 IMPLANT
MANIFOLD 4PT FOR NEPTUNE1 (MISCELLANEOUS) ×2 IMPLANT
MASK FACE SPIDER DISP (MASK) ×2 IMPLANT
MAT ABSORB  FLUID 56X50 GRAY (MISCELLANEOUS) ×2
MAT ABSORB FLUID 56X50 GRAY (MISCELLANEOUS) ×2 IMPLANT
NDL SAFETY ECLIPSE 18X1.5 (NEEDLE) ×1 IMPLANT
NEEDLE HYPO 18GX1.5 SHARP (NEEDLE) ×2
PACK ARTHROSCOPY SHOULDER (MISCELLANEOUS) ×2 IMPLANT
PAD WRAPON POLAR SHDR XLG (MISCELLANEOUS) ×1 IMPLANT
PASSER SUT FIRSTPASS SELF (INSTRUMENTS) ×1 IMPLANT
PASSER SUT SWIFTSTITCH HIP CRT (INSTRUMENTS) ×1 IMPLANT
PENCIL SMOKE EVACUATOR (MISCELLANEOUS) ×2 IMPLANT
SET TUBE SUCT SHAVER OUTFL 24K (TUBING) ×2 IMPLANT
SPONGE GAUZE 2X2 8PLY STRL LF (GAUZE/BANDAGES/DRESSINGS) ×2 IMPLANT
SPONGE LAP 18X18 RF (DISPOSABLE) ×1 IMPLANT
SUT 0 TIGERLINK 1.5 FWIRE CLS (SUTURE) ×2
SUT ETHILON 3-0 (SUTURE) ×2 IMPLANT
SUT ETHILON 3-0 FS-10 30 BLK (SUTURE) ×4
SUT MNCRL 4-0 (SUTURE)
SUT MNCRL 4-0 27XMFL (SUTURE)
SUT VIC AB 0 CT1 36 (SUTURE) ×1 IMPLANT
SUT VIC AB 2-0 CT2 27 (SUTURE) ×1 IMPLANT
SUTURE 0 TIGRLNK 1.5 FWIR CLS (SUTURE) IMPLANT
SUTURE EHLN 3-0 FS-10 30 BLK (SUTURE) IMPLANT
SUTURE MNCRL 4-0 27XMF (SUTURE) ×1 IMPLANT
SYR 10ML LL (SYRINGE) ×2 IMPLANT
TAPE MICROFOAM 4IN (TAPE) ×2 IMPLANT
TUBING ARTHRO INFLOW-ONLY STRL (TUBING) ×2 IMPLANT
TUBING CONNECTING 10 (TUBING) ×2 IMPLANT
WAND WEREWOLF FLOW 90D (MISCELLANEOUS) ×2 IMPLANT
WRAPON POLAR PAD SHDR XLG (MISCELLANEOUS) ×2

## 2021-01-16 NOTE — H&P (Signed)
Paper H&P to be scanned into permanent record. H&P reviewed. No significant changes noted.  

## 2021-01-16 NOTE — Transfer of Care (Signed)
Immediate Anesthesia Transfer of Care Note  Patient: Elizabeth [name]  Procedure(s) Performed: Right shoulder arthroscopic subscapularis repair, rotator cuff repair, subacromial decompression, and biceps tenodesis - Reche Dixon to Assist (Right Shoulder)  Patient Location: PACU  Anesthesia Type: General  Level of Consciousness: awake, alert  and patient cooperative  Airway and Oxygen Therapy: Patient Spontanous Breathing and Patient connected to supplemental oxygen  Post-op Assessment: Post-op Vital signs reviewed, Patient's Cardiovascular Status Stable, Respiratory Function Stable, Patent Airway and No signs of Nausea or vomiting  Post-op Vital Signs: Reviewed and stable  Complications: No complications documented.

## 2021-01-16 NOTE — Discharge Instructions (Signed)
Post-Op Instructions - Rotator Cuff Repair  1. Bracing: You will wear a shoulder immobilizer or sling for 6 weeks.   2. Driving: No driving for 3 weeks post-op. When driving, do not wear the immobilizer. Ideally, we recommend no driving for 6 weeks while sling is in place as one arm will be immobilized.   3. Activity: No active lifting for 2 months. Wrist, hand, and elbow motion only. Avoid lifting the upper arm away from the body except for hygiene. You are permitted to bend and straighten the elbow passively only (no active elbow motion). You may use your hand and wrist for typing, writing, and managing utensils (cutting food). Do not lift more than a coffee cup for 8 weeks.  When sleeping or resting, inclined positions (recliner chair or wedge pillow) and a pillow under the forearm for support may provide better comfort for up to 4 weeks.  Avoid long distance travel for 4 weeks.  Return to normal activities after rotator cuff repair repair normally takes 6 months on average. If rehab goes very well, may be able to do most activities at 4 months, except overhead or contact sports.  4. Physical Therapy: Begins 3-4 days after surgery, and proceed 1 time per week for the first 6 weeks, then 1-2 times per week from weeks 6-20 post-op.  5. Medications:  - You will be provided a prescription for narcotic pain medicine. After surgery, take 1-2 narcotic tablets every 4 hours if needed for severe pain.  - A prescription for anti-nausea medication will be provided in case the narcotic medicine causes nausea - take 1 tablet every 6 hours only if nauseated.   - Take tylenol 1000 mg (2 Extra Strength tablets or 3 regular strength) every 8 hours for pain.  May decrease or stop tylenol 5 days after surgery if you are having minimal pain. - Take ASA 325mg/day x 2 weeks to help prevent DVTs/PEs (blood clots).  - DO NOT take ANY nonsteroidal anti-inflammatory pain medications (Advil, Motrin, Ibuprofen, Aleve,  Naproxen, or Naprosyn). These medicines can inhibit healing of your shoulder repair.    If you are taking prescription medication for anxiety, depression, insomnia, muscle spasm, chronic pain, or for attention deficit disorder, you are advised that you are at a higher risk of adverse effects with use of narcotics post-op, including narcotic addiction/dependence, depressed breathing, death. If you use non-prescribed substances: alcohol, marijuana, cocaine, heroin, methamphetamines, etc., you are at a higher risk of adverse effects with use of narcotics post-op, including narcotic addiction/dependence, depressed breathing, death. You are advised that taking > 50 morphine milligram equivalents (MME) of narcotic pain medication per day results in twice the risk of overdose or death. For your prescription provided: oxycodone 5 mg - taking more than 6 tablets per day would result in > 50 morphine milligram equivalents (MME) of narcotic pain medication. Be advised that we will prescribe narcotics short-term, for acute post-operative pain only - 3 weeks for major operations such as shoulder repair/reconstruction surgeries.     6. Post-Op Appointment:  Your first post-op appointment will be 10-14 days post-op.  7. Work or School: For most, but not all procedures, we advise staying out of work or school for at least 1 to 2 weeks in order to recover from the stress of surgery and to allow time for healing.   If you need a work or school note this can be provided.   8. Smoking: If you are a smoker, you need to refrain from   smoking in the postoperative period. The nicotine in cigarettes will inhibit healing of your shoulder repair and decrease the chance of successful repair. Similarly, nicotine containing products (gum, patches) should be avoided.   Post-operative Brace: Apply and remove the brace you received as you were instructed to at the time of fitting and as described in detail as the brace's  instructions for use indicate.  Wear the brace for the period of time prescribed by your physician.  The brace can be cleaned with soap and water and allowed to air dry only.  Should the brace result in increased pain, decreased feeling (numbness/tingling), increased swelling or an overall worsening of your medical condition, please contact your doctor immediately.  If an emergency situation occurs as a result of wearing the brace after normal business hours, please dial 911 and seek immediate medical attention.  Let your doctor know if you have any further questions about the brace issued to you. Refer to the shoulder sling instructions for use if you have any questions regarding the correct fit of your shoulder sling.  [employer] Customer Care for Troubleshooting: [phone]  Video that illustrates how to properly use a shoulder sling: "Instructions for Proper Use of an Orthopaedic Sling" https://www.youtube.com/watch?v=AHZpn_Xo45w        General Anesthesia, Adult, Care After This sheet gives you information about how to care for yourself after your procedure. Your health care provider may also give you more specific instructions. If you have problems or questions, contact your health care provider. What can I expect after the procedure? After the procedure, the following side effects are common:  Pain or discomfort at the IV site.  Nausea.  Vomiting.  Sore throat.  Trouble concentrating.  Feeling cold or chills.  Feeling weak or tired.  Sleepiness and fatigue.  Soreness and body aches. These side effects can affect parts of the body that were not involved in surgery. Follow these instructions at home: For the time period you were told by your health care provider:  Rest.  Do not participate in activities where you could fall or become injured.  Do not drive or use machinery.  Do not drink alcohol.  Do not take sleeping pills or medicines that cause drowsiness.  Do not  make important decisions or sign legal documents.  Do not take care of children on your own.   Eating and drinking  Follow any instructions from your health care provider about eating or drinking restrictions.  When you feel hungry, start by eating small amounts of foods that are soft and easy to digest (bland), such as toast. Gradually return to your regular diet.  Drink enough fluid to keep your urine pale yellow.  If you vomit, rehydrate by drinking water, juice, or clear broth. General instructions  If you have sleep apnea, surgery and certain medicines can increase your risk for breathing problems. Follow instructions from your health care provider about wearing your sleep device: ? Anytime you are sleeping, including during daytime naps. ? While taking prescription pain medicines, sleeping medicines, or medicines that make you drowsy.  Have a responsible adult stay with you for the time you are told. It is important to have someone help care for you until you are awake and alert.  Return to your normal activities as told by your health care provider. Ask your health care provider what activities are safe for you.  Take over-the-counter and prescription medicines only as told by your health care provider.  If you smoke, do   not smoke without supervision.  Keep all follow-up visits as told by your health care provider. This is important. Contact a health care provider if:  You have nausea or vomiting that does not get better with medicine.  You cannot eat or drink without vomiting.  You have pain that does not get better with medicine.  You are unable to pass urine.  You develop a skin rash.  You have a fever.  You have redness around your IV site that gets worse. Get help right away if:  You have difficulty breathing.  You have chest pain.  You have blood in your urine or stool, or you vomit blood. Summary  After the procedure, it is common to have a sore throat  or nausea. It is also common to feel tired.  Have a responsible adult stay with you for the time you are told. It is important to have someone help care for you until you are awake and alert.  When you feel hungry, start by eating small amounts of foods that are soft and easy to digest (bland), such as toast. Gradually return to your regular diet.  Drink enough fluid to keep your urine pale yellow.  Return to your normal activities as told by your health care provider. Ask your health care provider what activities are safe for you. This information is not intended to replace advice given to you by your health care provider. Make sure you discuss any questions you have with your health care provider. Document Revised: 05/08/2020 Document Reviewed: 12/06/2019 Elsevier Patient Education  2021 Elsevier Inc.  

## 2021-01-16 NOTE — Anesthesia Procedure Notes (Signed)
Procedure Name: MAC Performed by: Mayme Genta, CRNA Pre-anesthesia Checklist: Patient identified, Emergency Drugs available, Suction available, Patient being monitored and Timeout performed Patient Re-evaluated:Patient Re-evaluated prior to induction Oxygen Delivery Method: Simple face mask Placement Confirmation: positive ETCO2 and breath sounds checked- equal and bilateral

## 2021-01-16 NOTE — Op Note (Signed)
SURGERY DATE: 01/16/2021   PRE-OP DIAGNOSIS:  1. Right subacromial impingement 2. Right biceps tendinopathy 3. Right rotator cuff tear   POST-OP DIAGNOSIS: 1. Right subacromial impingement 2. Right biceps tendon tear 3. Right rotator cuff tear   PROCEDURES:  1. Right arthroscopic rotator cuff repair 2. Right arthroscopic biceps tenodesis 3. Right arthroscopic subacromial decompression 4. Right arthroscopic extensive debridement of shoulder (glenohumeral and subacromial spaces)   SURGEON: Cato Mulligan, MD   ASSISTANT: Anitra Lauth, PA; [name] Pop, PA-S    ANESTHESIA: Gen with Exparel interscalene block   ESTIMATED BLOOD LOSS: 5cc    DRAINS:  none   TOTAL IV FLUIDS: per anesthesia      SPECIMENS: none   IMPLANTS:  - Arthrex 2.67mm PushLock x 1 - Arthrex 4.34mm SwiveLock x 2    OPERATIVE FINDINGS:  Examination under anesthesia: A careful examination under anesthesia was performed.  Passive range of motion was: FF: 150; ER at side: 45; ER in abduction: 90; IR in abduction: 45.  Anterior load shift: NT.  Posterior load shift: NT.  Sulcus in neutral: NT.  Sulcus in ER: NT.     Intra-operative findings: A thorough arthroscopic examination of the shoulder was performed.  The findings are: 1. Biceps tendon: tendinopathy with significant erythema and longitudinal split tearing 2. Superior labrum: erythema 3. Posterior labrum and capsule: normal 4. Inferior capsule and inferior recess: normal 5. Glenoid cartilage surface: Normal 6. Supraspinatus attachment: full-thickness tear of the supraspinatus 7. Posterior rotator cuff attachment: normal 8. Humeral head articular cartilage: There is a grade 1 degenerative change 9. Rotator interval: significant synovitis 10: Subscapularis tendon: attachment intact 11. Anterior labrum: Mildly degenerative 12. IGHL: normal   OPERATIVE REPORT:    Indications for procedure:  Elizabeth [name] is a 60 y.o. female with chronic shoulder pain  for many years that significantly worsened approximately 6 weeks ago when she had a fall since this fall, she has had significant difficulty lifting her arm over her head and increased pain at night.  She had failed nonoperative management consisting of activity modifications, medical management, and corticosteroid injection.  Clinical exam and MRI were suggestive of a full-thickness rotator cuff tear, biceps tendinopathy, and subacromial impingement. After discussion of risks, benefits, and alternatives to surgery, the patient elected to proceed.    Procedure in detail:   I identified Elizabeth [name] in the pre-operative holding area.  I marked the operative shoulder with my initials. I reviewed the risks and benefits of the proposed surgical intervention, and the patient wished to proceed.  Anesthesia was then performed with an Exparel interscalene block.  The patient was transferred to the operative suite and placed in the beach chair position.     Appropriate IV antibiotics were administered prior to incision. The operative upper extremity was then prepped and draped in standard fashion. A time out was performed confirming the correct extremity, correct patient, and correct procedure.    I then created a standard posterior portal with an 11 blade. The glenohumeral joint was easily entered with a blunt trocar and the arthroscope introduced. The findings of diagnostic arthroscopy are described above. I debrided degenerative tissue including the synovitic tissue about the rotator interval and anterior and superior labrum. I then coagulated the inflamed synovium to obtain hemostasis and reduce the risk of post-operative swelling using an Arthrocare radiofrequency device.   I then turned my attention to the arthroscopic biceps tenodesis.  I used the Loop n Tack technique to pass a FiberTape through  the biceps in a locked fashion adjacent to the biceps anchor.  A hole for a 2.9 mm Arthrex PushLock was drilled  in the bicipital groove just superior to the subscapularis tendon insertion.  The biceps tendon was then cut.  The biceps anchor complex was debrided back to a stable base using an ArthroCare wand and oscillating shaver. The FiberTape was loaded onto the PushLock anchor and impacted into place into the previously drilled hole in the bicipital groove.  This appropriately secured the biceps into the bicipital groove and took it off of tension.   Next, the arthroscope was then introduced into the subacromial space. A direct lateral portal was created with an 11-blade after spinal needle localization. An extensive subacromial bursectomy and debridement was performed using a combination of the shaver and Arthrocare wand. The entire acromial undersurface was exposed and the CA ligament was subperiosteally elevated to expose the anterior acromial hook. A burr was used to create a flat anterior and lateral aspect of the acromion, converting it from a Type 3 to a Type 1 acromion. Care was made to keep the deltoid fascia intact.   Next I created an accessory anteroolateral portal to assist with instrumentation.  I debrided the poor quality edges of the supraspinatus tendon.  This was a U-shaped tear of the supraspinatus.  I prepared the footprint using a burr to expose bleeding bone.    I then percutaneously placed 1 Iconix SPEED medial row anchor along the at the articular margin.  However, this did not achieve appropriate fixation.  Therefore the sutures were loaded onto an Arthrex 4.75 mm SwiveLock anchor and this was placed in the same hole. This achieved excellent fixation. I then shuttled the 4 strands of tape as well as the 2 strands of FiberWire through the rotator cuff just lateral to the musculotendinous junction using a FirstPass suture passer spanning the anterior to posterior extent of the tear.  All sick strands of suture were passed through an [employer] anchor.  This was placed approximately 2 cm  distal to the lateral edge of the footprint in line with the middle of the tear with appropriate tensioning of each suture prior to final fixation. There was a small dogear.  The knotless mechanism was utilized to reduce this dogear. This construct allowed for excellent reapproximation of the rotator cuff to its native footprint without undue tension.  Appropriate compression was achieved.  The repair was stable to external and internal rotation.   Fluid was evacuated from the shoulder, and the portals were closed with 3-0 Nylon. Xeroform was applied to the portals. A sterile dressing was applied, followed by a Polar Care sleeve and a SlingShot shoulder immobilizer/sling. The patient was awakened from anesthesia without difficulty and was transferred to the PACU in stable condition.    Of note, assistance from a PA was essential to performing the surgery.  PA was present for the entire surgery.  PA assisted with patient positioning, retraction, instrumentation, and wound closure. The surgery would have been more difficult and had longer operative time without PA assistance.   COMPLICATIONS: none   DISPOSITION: plan for discharge home after recovery in PACU     POSTOPERATIVE PLAN: Remain in sling (except hygiene and elbow/wrist/hand RoM exercises as instructed by PT) x 6 weeks and NWB for this time. PT to begin 3-4 days after surgery.  Large rotator cuff repair rehab protocol. ASA 325mg  daily x 2 weeks for DVT ppx.

## 2021-01-16 NOTE — Progress Notes (Signed)
Assisted Rochel Brome ANMD with right, interscalene  block. Side rails up, monitors on throughout procedure. See vital signs in flow sheet. Tolerated Procedure well.

## 2021-01-16 NOTE — Anesthesia Preprocedure Evaluation (Addendum)
Anesthesia Evaluation  Patient identified by MRN, date of birth, ID band Patient awake    Reviewed: Allergy & Precautions, H&P , NPO status , Patient's Chart, lab work & pertinent test results, reviewed documented beta blocker date and time   History of Anesthesia Complications (+) PONV and history of anesthetic complications  Airway Mallampati: II  TM Distance: >3 FB Neck ROM: full    Dental no notable dental hx.    Pulmonary former smoker,    Pulmonary exam normal breath sounds clear to auscultation       Cardiovascular Exercise Tolerance: Good hypertension, Normal cardiovascular exam Rhythm:regular Rate:Normal     Neuro/Psych PSYCHIATRIC DISORDERS Anxiety negative neurological ROS     GI/Hepatic Neg liver ROS, GERD  ,  Endo/Other  Hyperthyroidism   Renal/GU negative Renal ROS  negative genitourinary   Musculoskeletal   Abdominal   Peds  Hematology negative hematology ROS (+)   Anesthesia Other Findings Parathyroidectomy 01/05/21, probable paralyzed vocal cord on R with hoarseness  Reproductive/Obstetrics negative OB ROS                           Anesthesia Physical Anesthesia Plan  ASA: II  Anesthesia Plan: General   Post-op Pain Management:  Regional for Post-op pain   Induction:   PONV Risk Score and Plan:   Airway Management Planned: Mask  Additional Equipment:   Intra-op Plan:   Post-operative Plan:   Informed Consent: I have reviewed the patients History and Physical, chart, labs and discussed the procedure including the risks, benefits and alternatives for the proposed anesthesia with the patient or authorized representative who has indicated his/her understanding and acceptance.     Dental Advisory Given  Plan Discussed with: CRNA  Anesthesia Plan Comments:        Anesthesia Quick Evaluation

## 2021-01-16 NOTE — Anesthesia Postprocedure Evaluation (Signed)
Anesthesia Post Note  Patient: Elizabeth [name]  Procedure(s) Performed: Right shoulder arthroscopic subscapularis repair, rotator cuff repair, subacromial decompression, and biceps tenodesis - Reche Dixon to Assist (Right Shoulder)     Patient location during evaluation: PACU Anesthesia Type: General Level of consciousness: awake and alert Pain management: pain level controlled Vital Signs Assessment: post-procedure vital signs reviewed and stable Respiratory status: spontaneous breathing, nonlabored ventilation, respiratory function stable and patient connected to nasal cannula oxygen Cardiovascular status: blood pressure returned to baseline and stable Postop Assessment: no apparent nausea or vomiting Anesthetic complications: no   No complications documented.  Trecia Rogers

## 2021-01-16 NOTE — Anesthesia Procedure Notes (Signed)
Anesthesia Regional Block: Interscalene brachial plexus block   Pre-Anesthetic Checklist: ,, timeout performed, Correct Patient, Correct Site, Correct Laterality, Correct Procedure, Correct Position, site marked, Risks and benefits discussed,  Surgical consent,  Pre-op evaluation,  At surgeon's request and post-op pain management  Laterality: Right  Prep: chloraprep       Needles:  Injection technique: Single-shot  Needle Type: Stimiplex     Needle Length: 5cm  Needle Gauge: 22     Additional Needles:   Procedures:,,,, ultrasound used (permanent image in chart),,,,  Narrative:  Start time: 01/16/2021 9:29 AM End time: 01/16/2021 9:35 AM Injection made incrementally with aspirations every 5 mL.  Performed by: Personally  Anesthesiologist: Rochel Brome, MD  Additional Notes: Functioning IV was confirmed and monitors applied. Ultrasound guidance: relevant anatomy identified, needle position confirmed, local anesthetic spread visualized around nerve(s)., vascular puncture avoided.  Image printed for medical record.  Negative aspiration and no paresthesias; incremental administration of local anesthetic. The patient tolerated the procedure well. Vitals signes recorded in RN notes.

## 2021-01-19 ENCOUNTER — Encounter: Payer: Self-pay | Admitting: Orthopedic Surgery

## 2021-01-20 DIAGNOSIS — M25511 Pain in right shoulder: Secondary | ICD-10-CM | POA: Diagnosis not present

## 2021-01-20 DIAGNOSIS — G8929 Other chronic pain: Secondary | ICD-10-CM | POA: Diagnosis not present

## 2021-01-27 DIAGNOSIS — E210 Primary hyperparathyroidism: Secondary | ICD-10-CM | POA: Diagnosis not present

## 2021-01-30 DIAGNOSIS — G8929 Other chronic pain: Secondary | ICD-10-CM | POA: Diagnosis not present

## 2021-01-30 DIAGNOSIS — M25511 Pain in right shoulder: Secondary | ICD-10-CM | POA: Diagnosis not present

## 2021-02-03 ENCOUNTER — Other Ambulatory Visit: Payer: Self-pay | Admitting: Family Medicine

## 2021-02-04 DIAGNOSIS — M25511 Pain in right shoulder: Secondary | ICD-10-CM | POA: Diagnosis not present

## 2021-02-04 DIAGNOSIS — G8929 Other chronic pain: Secondary | ICD-10-CM | POA: Diagnosis not present

## 2021-02-05 ENCOUNTER — Ambulatory Visit: Payer: BLUE CROSS/BLUE SHIELD | Admitting: Internal Medicine

## 2021-02-09 ENCOUNTER — Encounter: Payer: Self-pay | Admitting: Internal Medicine

## 2021-02-09 ENCOUNTER — Other Ambulatory Visit: Payer: Self-pay

## 2021-02-09 ENCOUNTER — Ambulatory Visit: Payer: BLUE CROSS/BLUE SHIELD | Admitting: Internal Medicine

## 2021-02-09 VITALS — BP 140/90 | HR 76 | Temp 96.0°F | Ht 65.0 in | Wt 185.6 lb

## 2021-02-09 DIAGNOSIS — E892 Postprocedural hypoparathyroidism: Secondary | ICD-10-CM

## 2021-02-09 DIAGNOSIS — Z9889 Other specified postprocedural states: Secondary | ICD-10-CM

## 2021-02-09 DIAGNOSIS — K2970 Gastritis, unspecified, without bleeding: Secondary | ICD-10-CM

## 2021-02-09 DIAGNOSIS — R110 Nausea: Secondary | ICD-10-CM

## 2021-02-09 DIAGNOSIS — E0590 Thyrotoxicosis, unspecified without thyrotoxic crisis or storm: Secondary | ICD-10-CM | POA: Diagnosis not present

## 2021-02-09 DIAGNOSIS — E213 Hyperparathyroidism, unspecified: Secondary | ICD-10-CM | POA: Diagnosis not present

## 2021-02-09 DIAGNOSIS — B9681 Helicobacter pylori [H. pylori] as the cause of diseases classified elsewhere: Secondary | ICD-10-CM

## 2021-02-09 DIAGNOSIS — I10 Essential (primary) hypertension: Secondary | ICD-10-CM

## 2021-02-09 MED ORDER — PANTOPRAZOLE SODIUM 40 MG PO TBEC
40.0000 mg | DELAYED_RELEASE_TABLET | Freq: Every day | ORAL | 3 refills | Status: DC
Start: 2021-02-09 — End: 2021-02-09

## 2021-02-09 MED ORDER — PANTOPRAZOLE SODIUM 40 MG PO TBEC: 40.0000 mg | DELAYED_RELEASE_TABLET | Freq: Every day | ORAL | 3 refills | Status: DC

## 2021-02-09 MED ORDER — SPIRONOLACTONE 50 MG PO TABS: 50.0000 mg | ORAL_TABLET | Freq: Every day | ORAL | 1 refills | Status: DC

## 2021-02-09 NOTE — Progress Notes (Addendum)
Subjective:  Patient ID: Elizabeth [name], female    DOB: 14-Sep-1960  Age: 60 y.o. MRN: [id number]  CC: The primary encounter diagnosis was Hyperparathyroidism ([place]). Diagnoses of Hypertension, unspecified type, Hyperthyroidism, Nausea, S/P removal of parathyroid gland ([place]), S/P rotator cuff surgery, and Helicobacter pylori gastritis were also pertinent to this visit.  HPI [name] presents for evaluation and management of early  Morning nausea,   This visit occurred during the SARS-CoV-2 public health emergency.  Safety protocols were in place, including screening questions prior to the visit, additional usage of staff PPE, and extensive cleaning of exam room while observing appropriate contact time as indicated for disinfecting solutions.   Has been having recurrent Nausea that occurs every morning for less than 15 minutes,  before taking any medications.  Gone by mid morning .  Has been occurring for at least six months.  No vomiting. Stools are normal and she is not constipated.  No unintentional weight loss. Appetite fine.   Does not drink alcohol regularly or take NSAIDs regularly  Or recently.   Takes spironlactone daily with losartan,  But makes sure to drink 64 ounces water daily     Fell on right shoulder in March , acute rotator cuff tear, s/p  surgery May 13 .  Constant pain but improving/  Taking PT .  Taking 650 mg every 6 hours as needed  .  Stopped oxycodone   Had COVID  Infection in late December ; not sure if it started before or after   Parathyroidectomy  for adenoma done 4 weeks ago by [facility].  Voice still affected , but getting stronger per patient.  Muscle cramps occurring less frequently but still intermittent and sporadic (feet mostly)     Outpatient Medications Prior to Visit  Medication Sig Dispense Refill   acetaminophen (TYLENOL) 500 MG tablet Take 2 tablets (1,000 mg total) by mouth every 8 (eight) hours. 90 tablet 2   Cholecalciferol 25 MCG (1000 UT)  tablet Take 1,000 Units by mouth daily.     cyclobenzaprine (FLEXERIL) 5 MG tablet Take 1 tablet (5 mg total) by mouth at bedtime as needed for muscle spasms. 30 tablet 2   FLUTICASONE PROPIONATE NA Place 1-2 sprays into the nose 2 (two) times daily as needed (allergies).     gabapentin (NEURONTIN) 300 MG capsule Take 300 mg by mouth daily as needed (neurpoathy).     losartan (COZAAR) 100 MG tablet Take 1 tablet (100 mg total) by mouth daily. 90 tablet 3   ondansetron (ZOFRAN ODT) 4 MG disintegrating tablet Take 1 tablet (4 mg total) by mouth every 8 (eight) hours as needed for nausea or vomiting. 20 tablet 0   oxyCODONE (ROXICODONE) 5 MG immediate release tablet Take 1-2 tablets (5-10 mg total) by mouth every 4 (four) hours as needed (pain). 30 tablet 0   PARoxetine (PAXIL) 20 MG tablet Take 1 tablet (20 mg total) by mouth daily. With dinner 30 tablet 5   propylthiouracil (PTU) 50 MG tablet TAKE 1 TABLET(50 MG) BY MOUTH THREE TIMES DAILY (Patient taking differently: Take 50 mg by mouth 3 (three) times daily.) 270 tablet 0   propranolol (INDERAL) 20 MG tablet TAKE 1 TABLET(20 MG) BY MOUTH THREE TIMES DAILY 270 tablet 1   spironolactone (ALDACTONE) 50 MG tablet TAKE 1/2 TABLET BY MOUTH DAILY FOR 3 DAYS, THEN INCREASE TO 1 TABLET IF BLOOD PRESSURE>130/80 DAY 4 IN THE AM (Patient taking differently: Take 50 mg by mouth daily.) 90 tablet 0  No facility-administered medications prior to visit.    Review of Systems;  Patient denies headache, fevers, malaise, unintentional weight loss, skin rash, eye pain, sinus congestion and sinus pain, sore throat, dysphagia,  hemoptysis , cough, dyspnea, wheezing, chest pain, palpitations, orthopnea, edema, abdominal pain, nausea, melena, diarrhea, constipation, flank pain, dysuria, hematuria, urinary  Frequency, nocturia, numbness, tingling, seizures,  Focal weakness, Loss of consciousness,  Tremor, insomnia, depression, anxiety, and suicidal ideation.       Objective:  BP 140/90 (BP Location: Left Arm, Patient Position: Sitting)   Pulse 76   Temp (!) 96 F (35.6 C)   Ht 5\' 5"  (1.651 m)   Wt 185 lb 9.6 oz (84.2 kg)   SpO2 95%   BMI 30.89 kg/m   BP Readings from Last 3 Encounters:  02/09/21 140/90  01/16/21 130/75  01/05/21 (!) 145/76    Wt Readings from Last 3 Encounters:  02/09/21 185 lb 9.6 oz (84.2 kg)  01/16/21 183 lb (83 kg)  01/05/21 182 lb 1.6 oz (82.6 kg)    General appearance: alert, cooperative and appears stated age Ears: normal TM's and external ear canals both ears Throat: lips, mucosa, and tongue normal; teeth and gums normal Neck: no adenopathy, no carotid bruit, supple, symmetrical, trachea midline and thyroid not enlarged, symmetric, no tenderness/mass/nodules Back: symmetric, no curvature. ROM normal. No CVA tenderness. Lungs: clear to auscultation bilaterally Heart: regular rate and rhythm, S1, S2 normal, no murmur, click, rub or gallop Abdomen: soft, non-tender; bowel sounds normal; no masses,  no organomegaly Pulses: 2+ and symmetric Skin: Skin color, texture, turgor normal. No rashes or lesions Lymph nodes: Cervical, supraclavicular, and axillary nodes normal.  Lab Results  Component Value Date   HGBA1C 5.7 08/23/2019   HGBA1C 5.8 01/23/2019    Lab Results  Component Value Date   CREATININE 1.04 (H) 02/09/2021   CREATININE 1.03 (H) 01/01/2021   CREATININE 1.08 11/05/2020    Lab Results  Component Value Date   WBC 5.5 01/01/2021   HGB 13.4 01/01/2021   HCT 40.9 01/01/2021   PLT 299 01/01/2021   GLUCOSE 92 02/09/2021   CHOL 238 (H) 02/27/2019   TRIG 125.0 02/27/2019   HDL 54.90 02/27/2019   LDLCALC 158 (H) 02/27/2019   ALT 15 02/09/2021   AST 17 02/09/2021   NA 139 02/09/2021   K 4.2 02/09/2021   CL 100 02/09/2021   CREATININE 1.04 (H) 02/09/2021   BUN 13 02/09/2021   CO2 20 02/09/2021   TSH 3.050 02/09/2021   HGBA1C 5.7 08/23/2019   MICROALBUR 3.3 (H) 08/23/2019    No  results found.  Assessment & Plan:   Problem List Items Addressed This Visit       Unprioritized   Hyperthyroidism    Secondary to Grave's disease per Endocrinology, managed with  PTU and propranolol.  TSH is WNL  Lab Results  Component Value Date   TSH 3.050 02/09/2021          Relevant Orders   TSH (Completed)   Hyperparathyroidism ([place]) - Primary   Relevant Orders   Calcium, ionized (Completed)   VITAMIN D 25 Hydroxy (Vit-D Deficiency, Fractures) (Completed)   Nausea    Symptoms are brief but recurrent each morning in a fasting state.  Electrolytes and cr are unchanged  H pylori is pending  will treat empirically for gastritis with a PPI for one month.  If no resolution,  Imaging/EGD needed   Lab Results  Component Value Date   NA  139 02/09/2021   K 4.2 02/09/2021   CL 100 02/09/2021   CO2 20 02/09/2021   Lab Results  Component Value Date   CREATININE 1.04 (H) 02/09/2021          Relevant Orders   Comprehensive metabolic panel (Completed)   H Pylori, IGM, IGG, IGA AB (Completed)   Urinalysis (Completed)   S/P removal of parathyroid gland ([place])    Secondary to large adenoma, May 2022.  Calcium level is normal on repeat today  Lab Results  Component Value Date   CALCIUM 9.5 02/09/2021         S/P rotator cuff surgery    Right shoulder, March 2022 secondary to fall with acute tear of supraspinatus (full thickness) and infraspinatus muscles .  She continues to wear sling and receive PT.  No longer using opiates.  Advised to avoid NSAIDs given current c/o nausea       Helicobacter pylori gastritis    will treat with clarithromycin 500 mg BID x 14 days,  Amoxicillin 1000 mg BID x 14 days  And pantoprazole 40 mg bid x 14 days        Relevant Medications   clarithromycin (BIAXIN) 500 MG tablet   Other Visit Diagnoses     Hypertension, unspecified type       Relevant Medications   spironolactone (ALDACTONE) 50 MG tablet      I provided  30  minutes of  face-to-face time during this encounter reviewing patient's current c/o nausea,  Her recent shoulder and parathyroid  surgeries, labs and imaging studies, providing counseling on the above mentioned problems , and coordination  of care .  I have discontinued Shenelle Staszewski's pantoprazole and pantoprazole. I have also changed her spironolactone and pantoprazole. Additionally, I am having her start on clarithromycin and amoxicillin. Lastly, I am having her maintain her FLUTICASONE PROPIONATE NA, propylthiouracil, gabapentin, Cholecalciferol, losartan, cyclobenzaprine, PARoxetine, ondansetron, oxyCODONE, and acetaminophen.  Meds ordered this encounter  Medications   spironolactone (ALDACTONE) 50 MG tablet    Sig: Take 1 tablet (50 mg total) by mouth daily.    Dispense:  90 tablet    Refill:  1    **Patient requests 90 days supply**   DISCONTD: pantoprazole (PROTONIX) 40 MG tablet    Sig: Take 1 tablet (40 mg total) by mouth daily.    Dispense:  30 tablet    Refill:  3   DISCONTD: pantoprazole (PROTONIX) 40 MG tablet    Sig: Take 1 tablet (40 mg total) by mouth daily.    Dispense:  30 tablet    Refill:  3   DISCONTD: pantoprazole (PROTONIX) 40 MG tablet    Sig: Take 1 tablet (40 mg total) by mouth daily.    Dispense:  30 tablet    Refill:  3   pantoprazole (PROTONIX) 40 MG tablet    Sig: Take 1 tablet (40 mg total) by mouth 2 (two) times daily. Increase to twice daily as part of 14 day treatment protocol for h pylori gastritis    Dispense:  30 tablet    Refill:  0   clarithromycin (BIAXIN) 500 MG tablet    Sig: Take 1 tablet (500 mg total) by mouth 2 (two) times daily.    Dispense:  28 tablet    Refill:  0   amoxicillin (AMOXIL) 500 MG capsule    Sig: Take 2 capsules (1,000 mg total) by mouth 2 (two) times daily for 14 days.    Dispense:  56 capsule    Refill:  0    Medications Discontinued During This Encounter  Medication Reason   spironolactone (ALDACTONE) 50 MG  tablet Reorder   pantoprazole (PROTONIX) 40 MG tablet    pantoprazole (PROTONIX) 40 MG tablet    pantoprazole (PROTONIX) 40 MG tablet     Follow-up: Return in about 6 weeks (around 03/23/2021).   Crecencio Mc, MD

## 2021-02-09 NOTE — Patient Instructions (Addendum)
For your shoulder pain I recommend more aggressive treatment of pain:  On days of PT<  Take   3 pills  one hour before PT ,, and repeat the dose 2 more times that  Day (975 MG EVERY 8)  On days you are  NOT getting PT,   Take 2000 mg every day in divided  Doses.  (650 MG  EVERY 8 )     yOUR NAUSEA MAY BE DUE TO DEHDYRATION OR GASTRITIS  Start taking pantoprazole 40 mg every day in the evening before bedtime  To treat gastritis as the cause of  morning nausea   The urinalysis and labs will help rule out other causes

## 2021-02-10 DIAGNOSIS — Z9889 Other specified postprocedural states: Secondary | ICD-10-CM | POA: Insufficient documentation

## 2021-02-10 DIAGNOSIS — E892 Postprocedural hypoparathyroidism: Secondary | ICD-10-CM | POA: Insufficient documentation

## 2021-02-10 LAB — URINALYSIS
Bilirubin, UA: NEGATIVE
Glucose, UA: NEGATIVE
Ketones, UA: NEGATIVE
Leukocytes,UA: NEGATIVE
Nitrite, UA: NEGATIVE
Protein,UA: NEGATIVE
RBC, UA: NEGATIVE
Specific Gravity, UA: 1.011 (ref 1.005–1.030)
Urobilinogen, Ur: 0.2 mg/dL (ref 0.2–1.0)
pH, UA: 6.5 (ref 5.0–7.5)

## 2021-02-10 NOTE — Assessment & Plan Note (Addendum)
Right shoulder, March 2022 secondary to fall with acute tear of supraspinatus (full thickness) and infraspinatus muscles .  She continues to wear sling and receive PT.  No longer using opiates.  Advised to avoid NSAIDs given current c/o nausea

## 2021-02-10 NOTE — Assessment & Plan Note (Addendum)
Symptoms are brief but recurrent each morning in a fasting state.  Electrolytes and cr are unchanged  H pylori is pending  will treat empirically for gastritis with a PPI for one month.  If no resolution,  Imaging/EGD needed   Lab Results  Component Value Date   NA 139 02/09/2021   K 4.2 02/09/2021   CL 100 02/09/2021   CO2 20 02/09/2021   Lab Results  Component Value Date   CREATININE 1.04 (H) 02/09/2021

## 2021-02-10 NOTE — Assessment & Plan Note (Signed)
Secondary to Grave's disease per Endocrinology, managed with  PTU and propranolol.  TSH is WNL  Lab Results  Component Value Date   TSH 3.050 02/09/2021

## 2021-02-10 NOTE — Assessment & Plan Note (Signed)
Secondary to large adenoma, May 2022.  Calcium level is normal on repeat today  Lab Results  Component Value Date   CALCIUM 9.5 02/09/2021

## 2021-02-11 DIAGNOSIS — M25511 Pain in right shoulder: Secondary | ICD-10-CM | POA: Diagnosis not present

## 2021-02-11 DIAGNOSIS — G8929 Other chronic pain: Secondary | ICD-10-CM | POA: Diagnosis not present

## 2021-02-11 LAB — COMPREHENSIVE METABOLIC PANEL
ALT: 15 IU/L (ref 0–32)
AST: 17 IU/L (ref 0–40)
Albumin/Globulin Ratio: 1.5 (ref 1.2–2.2)
Albumin: 4.3 g/dL (ref 3.8–4.9)
Alkaline Phosphatase: 107 IU/L (ref 44–121)
BUN/Creatinine Ratio: 13 (ref 12–28)
BUN: 13 mg/dL (ref 8–27)
Bilirubin Total: 0.4 mg/dL (ref 0.0–1.2)
CO2: 20 mmol/L (ref 20–29)
Calcium: 9.5 mg/dL (ref 8.7–10.3)
Chloride: 100 mmol/L (ref 96–106)
Creatinine, Ser: 1.04 mg/dL — ABNORMAL HIGH (ref 0.57–1.00)
Globulin, Total: 2.8 g/dL (ref 1.5–4.5)
Glucose: 92 mg/dL (ref 65–99)
Potassium: 4.2 mmol/L (ref 3.5–5.2)
Sodium: 139 mmol/L (ref 134–144)
Total Protein: 7.1 g/dL (ref 6.0–8.5)
eGFR: 62 mL/min/{1.73_m2} (ref >59)

## 2021-02-11 LAB — H PYLORI, IGM, IGG, IGA AB
H pylori, IgM Abs: <9 units (ref 0.0–8.9)
H. pylori, IgA Abs: 21.8 units — ABNORMAL HIGH (ref 0.0–8.9)
H. pylori, IgG AbS: 7.21 Index Value — ABNORMAL HIGH (ref 0.00–0.79)

## 2021-02-11 LAB — VITAMIN D 25 HYDROXY (VIT D DEFICIENCY, FRACTURES): Vit D, 25-Hydroxy: 36.3 ng/mL (ref 30.0–100.0)

## 2021-02-11 LAB — CALCIUM, IONIZED: Calcium, Ion: 5.2 mg/dL (ref 4.5–5.6)

## 2021-02-11 LAB — TSH: TSH: 3.05 u[IU]/mL (ref 0.450–4.500)

## 2021-02-16 ENCOUNTER — Other Ambulatory Visit: Payer: Self-pay | Admitting: Family Medicine

## 2021-02-17 DIAGNOSIS — B9681 Helicobacter pylori [H. pylori] as the cause of diseases classified elsewhere: Secondary | ICD-10-CM

## 2021-02-17 DIAGNOSIS — K2970 Gastritis, unspecified, without bleeding: Secondary | ICD-10-CM | POA: Insufficient documentation

## 2021-02-17 HISTORY — DX: Helicobacter pylori (H. pylori) as the cause of diseases classified elsewhere: B96.81

## 2021-02-17 MED ORDER — CLARITHROMYCIN 500 MG PO TABS: 500.0000 mg | ORAL_TABLET | Freq: Two times a day (BID) | ORAL | 0 refills | Status: DC

## 2021-02-17 MED ORDER — PANTOPRAZOLE SODIUM 40 MG PO TBEC: 40.0000 mg | DELAYED_RELEASE_TABLET | Freq: Two times a day (BID) | ORAL | 0 refills | Status: DC

## 2021-02-17 MED ORDER — AMOXICILLIN 500 MG PO CAPS: 1000.0000 mg | ORAL_CAPSULE | Freq: Two times a day (BID) | ORAL | 0 refills | Status: AC

## 2021-02-17 NOTE — Assessment & Plan Note (Signed)
will treat with clarithromycin 500 mg BID x 14 days,  Amoxicillin 1000 mg BID x 14 days  And pantoprazole 40 mg bid x 14 days

## 2021-02-17 NOTE — Addendum Note (Signed)
Addended by: Crecencio Mc on: 02/17/2021 01:39 PM   Modules accepted: Orders

## 2021-02-19 DIAGNOSIS — G8929 Other chronic pain: Secondary | ICD-10-CM | POA: Diagnosis not present

## 2021-02-19 DIAGNOSIS — M25511 Pain in right shoulder: Secondary | ICD-10-CM | POA: Diagnosis not present

## 2021-02-22 ENCOUNTER — Other Ambulatory Visit: Payer: Self-pay | Admitting: Internal Medicine

## 2021-02-22 DIAGNOSIS — I10 Essential (primary) hypertension: Secondary | ICD-10-CM

## 2021-02-23 ENCOUNTER — Telehealth: Payer: Self-pay | Admitting: Internal Medicine

## 2021-02-23 NOTE — Telephone Encounter (Signed)
Patient needs a refill on her losartan (COZAAR) 100 MG tablet and propranolol (INDERAL) 20 MG tablet. Please send to [facility] in [place].

## 2021-02-24 ENCOUNTER — Other Ambulatory Visit: Payer: Self-pay

## 2021-02-24 DIAGNOSIS — I10 Essential (primary) hypertension: Secondary | ICD-10-CM

## 2021-02-24 MED ORDER — PROPRANOLOL HCL 20 MG PO TABS: ORAL_TABLET | ORAL | 1 refills | Status: DC

## 2021-02-24 MED ORDER — LOSARTAN POTASSIUM 100 MG PO TABS: 100.0000 mg | ORAL_TABLET | Freq: Every day | ORAL | 3 refills | Status: DC

## 2021-02-24 NOTE — Telephone Encounter (Signed)
Patient called and notified that prescriptions were sent.

## 2021-02-25 DIAGNOSIS — G8929 Other chronic pain: Secondary | ICD-10-CM | POA: Diagnosis not present

## 2021-02-25 DIAGNOSIS — M25511 Pain in right shoulder: Secondary | ICD-10-CM | POA: Diagnosis not present

## 2021-03-03 DIAGNOSIS — M25511 Pain in right shoulder: Secondary | ICD-10-CM | POA: Diagnosis not present

## 2021-03-03 DIAGNOSIS — G8929 Other chronic pain: Secondary | ICD-10-CM | POA: Diagnosis not present

## 2021-03-05 DIAGNOSIS — G8929 Other chronic pain: Secondary | ICD-10-CM | POA: Diagnosis not present

## 2021-03-05 DIAGNOSIS — M25511 Pain in right shoulder: Secondary | ICD-10-CM | POA: Diagnosis not present

## 2021-03-12 DIAGNOSIS — M25511 Pain in right shoulder: Secondary | ICD-10-CM | POA: Diagnosis not present

## 2021-03-12 DIAGNOSIS — G8929 Other chronic pain: Secondary | ICD-10-CM | POA: Diagnosis not present

## 2021-03-18 DIAGNOSIS — G8929 Other chronic pain: Secondary | ICD-10-CM | POA: Diagnosis not present

## 2021-03-18 DIAGNOSIS — M25511 Pain in right shoulder: Secondary | ICD-10-CM | POA: Diagnosis not present

## 2021-03-20 DIAGNOSIS — G8929 Other chronic pain: Secondary | ICD-10-CM | POA: Diagnosis not present

## 2021-03-20 DIAGNOSIS — M25511 Pain in right shoulder: Secondary | ICD-10-CM | POA: Diagnosis not present

## 2021-03-23 DIAGNOSIS — G8929 Other chronic pain: Secondary | ICD-10-CM | POA: Diagnosis not present

## 2021-03-23 DIAGNOSIS — M25511 Pain in right shoulder: Secondary | ICD-10-CM | POA: Diagnosis not present

## 2021-03-25 ENCOUNTER — Other Ambulatory Visit: Payer: Self-pay

## 2021-03-25 ENCOUNTER — Ambulatory Visit: Payer: BLUE CROSS/BLUE SHIELD | Admitting: Internal Medicine

## 2021-03-25 ENCOUNTER — Encounter: Payer: Self-pay | Admitting: Internal Medicine

## 2021-03-25 DIAGNOSIS — E785 Hyperlipidemia, unspecified: Secondary | ICD-10-CM | POA: Diagnosis not present

## 2021-03-25 DIAGNOSIS — B9681 Helicobacter pylori [H. pylori] as the cause of diseases classified elsewhere: Secondary | ICD-10-CM

## 2021-03-25 DIAGNOSIS — Z9889 Other specified postprocedural states: Secondary | ICD-10-CM | POA: Diagnosis not present

## 2021-03-25 DIAGNOSIS — K2970 Gastritis, unspecified, without bleeding: Secondary | ICD-10-CM

## 2021-03-25 DIAGNOSIS — B354 Tinea corporis: Secondary | ICD-10-CM | POA: Diagnosis not present

## 2021-03-25 DIAGNOSIS — I10 Essential (primary) hypertension: Secondary | ICD-10-CM

## 2021-03-25 NOTE — Assessment & Plan Note (Signed)
Still getting PT

## 2021-03-25 NOTE — Progress Notes (Signed)
Subjective:  Patient ID: Elizabeth [name], female    DOB: 1961/04/15  Age: 60 y.o. MRN: [id number]  CC: Diagnoses of S/P rotator cuff surgery, Helicobacter pylori gastritis, Ringworm of body, Hyperlipidemia, unspecified hyperlipidemia type, and Essential hypertension were pertinent to this visit.  HPI Elizabeth [name] presents for follow up on multiple issues  This visit occurred during the SARS-CoV-2 public health emergency.  Safety protocols were in place, including screening questions prior to the visit, additional usage of staff PPE, and extensive cleaning of exam room while observing appropriate contact time as indicated for disinfecting solutions.    1) H Pylori gastritis diagnosed in June wthout EGD   and treated  with amox and clarithromycin.  Symptoms resolved,  but has #10 amox remaining despite taking  2 bid.    2) spot on left breast .  Woke up this morning with a round superficial lesion on left nipple and the border at 9:00 position..  was not present the day before,  contact with several daycare aged grandchildren . (Looks like  ringworm)  3) HTN  :  taking spironolactone, losartan and propranolol. BPs have been  130/80 , often > 120/80 but not lower.       Outpatient Medications Prior to Visit  Medication Sig Dispense Refill   acetaminophen (TYLENOL) 500 MG tablet Take 2 tablets (1,000 mg total) by mouth every 8 (eight) hours. 90 tablet 2   cyclobenzaprine (FLEXERIL) 5 MG tablet Take 1 tablet (5 mg total) by mouth at bedtime as needed for muscle spasms. 30 tablet 2   FLUTICASONE PROPIONATE NA Place 1-2 sprays into the nose 2 (two) times daily as needed (allergies).     gabapentin (NEURONTIN) 300 MG capsule Take 300 mg by mouth daily as needed (neurpoathy).     losartan (COZAAR) 100 MG tablet Take 1 tablet (100 mg total) by mouth daily. 90 tablet 3   PARoxetine (PAXIL) 20 MG tablet Take 1 tablet (20 mg total) by mouth daily. With dinner 30 tablet 5   propranolol (INDERAL) 20  MG tablet TAKE 1 TABLET(20 MG) BY MOUTH THREE TIMES DAILY 270 tablet 1   propylthiouracil (PTU) 50 MG tablet TAKE 1 TABLET(50 MG) BY MOUTH THREE TIMES DAILY (Patient taking differently: Take 50 mg by mouth 3 (three) times daily.) 270 tablet 0   spironolactone (ALDACTONE) 50 MG tablet TAKE 1/2 TABLET BY MOUTH DAILY FOR 3 DAYS, THEN INCREASE TO 1 TABLET IF BLOOD PRESSURE IS GREATER THAN 130/80 DAY 4 IN THE MORNING 90 tablet 1   Cholecalciferol 25 MCG (1000 UT) tablet Take 1,000 Units by mouth daily. (Patient not taking: Reported on 03/25/2021)     clarithromycin (BIAXIN) 500 MG tablet Take 1 tablet (500 mg total) by mouth 2 (two) times daily. (Patient not taking: Reported on 03/25/2021) 28 tablet 0   ondansetron (ZOFRAN ODT) 4 MG disintegrating tablet Take 1 tablet (4 mg total) by mouth every 8 (eight) hours as needed for nausea or vomiting. (Patient not taking: Reported on 03/25/2021) 20 tablet 0   oxyCODONE (ROXICODONE) 5 MG immediate release tablet Take 1-2 tablets (5-10 mg total) by mouth every 4 (four) hours as needed (pain). (Patient not taking: Reported on 03/25/2021) 30 tablet 0   pantoprazole (PROTONIX) 40 MG tablet Take 1 tablet (40 mg total) by mouth 2 (two) times daily. Increase to twice daily as part of 14 day treatment protocol for h pylori gastritis (Patient not taking: Reported on 03/25/2021) 30 tablet 0   No facility-administered medications  prior to visit.    Review of Systems;  Patient denies headache, fevers, malaise, unintentional weight loss, skin rash, eye pain, sinus congestion and sinus pain, sore throat, dysphagia,  hemoptysis , cough, dyspnea, wheezing, chest pain, palpitations, orthopnea, edema, abdominal pain, nausea, melena, diarrhea, constipation, flank pain, dysuria, hematuria, urinary  Frequency, nocturia, numbness, tingling, seizures,  Focal weakness, Loss of consciousness,  Tremor, insomnia, depression, anxiety, and suicidal ideation.      Objective:  BP 118/76 (BP  Location: Left Arm, Patient Position: Sitting, Cuff Size: Large)   Pulse (!) 58   Temp (!) 95.6 F (35.3 C) (Temporal)   Resp 15   Ht 5\' 5"  (1.651 m)   Wt 188 lb 6.4 oz (85.5 kg)   SpO2 98%   BMI 31.35 kg/m   BP Readings from Last 3 Encounters:  03/25/21 118/76  02/09/21 140/90  01/16/21 130/75    Wt Readings from Last 3 Encounters:  03/25/21 188 lb 6.4 oz (85.5 kg)  02/09/21 185 lb 9.6 oz (84.2 kg)  01/16/21 183 lb (83 kg)    General appearance: alert, cooperative and appears stated age Ears: normal TM's and external ear canals both ears Throat: lips, mucosa, and tongue normal; teeth and gums normal Neck: no adenopathy, no carotid bruit, supple, symmetrical, trachea midline and thyroid not enlarged, symmetric, no tenderness/mass/nodules Back: symmetric, no curvature. ROM normal. No CVA tenderness. Lungs: clear to auscultation bilaterally Heart: regular rate and rhythm, S1, S2 normal, no murmur, click, rub or gallop Abdomen: soft, non-tender; bowel sounds normal; no masses,  no organomegaly Pulses: 2+ and symmetric Skin: left breast with dime sized annular lesion with raised border . ymph nodes: Cervical, supraclavicular, and axillary nodes normal.  Lab Results  Component Value Date   HGBA1C 5.7 08/23/2019   HGBA1C 5.8 01/23/2019    Lab Results  Component Value Date   CREATININE 1.04 (H) 02/09/2021   CREATININE 1.03 (H) 01/01/2021   CREATININE 1.08 11/05/2020    Lab Results  Component Value Date   WBC 5.5 01/01/2021   HGB 13.4 01/01/2021   HCT 40.9 01/01/2021   PLT 299 01/01/2021   GLUCOSE 92 02/09/2021   CHOL 238 (H) 02/27/2019   TRIG 125.0 02/27/2019   HDL 54.90 02/27/2019   LDLCALC 158 (H) 02/27/2019   ALT 15 02/09/2021   AST 17 02/09/2021   NA 139 02/09/2021   K 4.2 02/09/2021   CL 100 02/09/2021   CREATININE 1.04 (H) 02/09/2021   BUN 13 02/09/2021   CO2 20 02/09/2021   TSH 3.050 02/09/2021   HGBA1C 5.7 08/23/2019   MICROALBUR 3.3 (H) 08/23/2019     No results found.  Assessment & Plan:   Problem List Items Addressed This Visit       Unprioritized   Essential hypertension    Managed with spironolactone, propranolol and losartan  Lab Results  Component Value Date   NA 139 02/09/2021   K 4.2 02/09/2021   CL 100 02/09/2021   CO2 20 02/09/2021   Lab Results  Component Value Date   CREATININE 1.04 (H) 48/18/5631         Helicobacter pylori gastritis    Symptoms has resolved after treatment for H Pylori gastritis        Hyperlipidemia    Untreated due to patient preference  repeat level needed   Lab Results  Component Value Date   CHOL 238 (H) 02/27/2019   HDL 54.90 02/27/2019   LDLCALC 158 (H) 02/27/2019   TRIG  125.0 02/27/2019   CHOLHDL 4 02/27/2019         Ringworm of body    Suggested by appearance of annular lesion on breast  Which appeared yesterday.  Will treat with terbinafine if still present in one week        S/P rotator cuff surgery    Still getting PT         I have discontinued Wynonia Hazard Cholecalciferol, ondansetron, oxyCODONE, pantoprazole, and clarithromycin. I am also having her maintain her FLUTICASONE PROPIONATE NA, propylthiouracil, gabapentin, cyclobenzaprine, PARoxetine, acetaminophen, spironolactone, propranolol, and losartan.  No orders of the defined types were placed in this encounter.   Medications Discontinued During This Encounter  Medication Reason   Cholecalciferol 25 MCG (1000 UT) tablet    clarithromycin (BIAXIN) 500 MG tablet Completed Course   ondansetron (ZOFRAN ODT) 4 MG disintegrating tablet    oxyCODONE (ROXICODONE) 5 MG immediate release tablet    pantoprazole (PROTONIX) 40 MG tablet Completed Course    Follow-up: Return in about 6 months (around 09/25/2021).   Crecencio Mc, MD

## 2021-03-25 NOTE — Patient Instructions (Addendum)
Glad you are feeling better.  If stomach pain returns,  call to get a new rx for pantoprazole   Keep the amxocillin..  check the bottle to see if #56 were dispensed   Ask daughter about whether any of your grandchildren have had  ringworm.  This is a fungal infection that causes ring shaped spots . . . (Like the one on your left nipple!)   You may use desitin or A & D ointment or moisturizer.Marland Kitchen if the spot is still present after 1-2 weeks,  call for an ointment that treats ringworm    Blood pressure is perfect!

## 2021-03-27 DIAGNOSIS — G8929 Other chronic pain: Secondary | ICD-10-CM | POA: Diagnosis not present

## 2021-03-27 DIAGNOSIS — R21 Rash and other nonspecific skin eruption: Secondary | ICD-10-CM | POA: Insufficient documentation

## 2021-03-27 DIAGNOSIS — M25511 Pain in right shoulder: Secondary | ICD-10-CM | POA: Diagnosis not present

## 2021-03-27 DIAGNOSIS — B354 Tinea corporis: Secondary | ICD-10-CM | POA: Insufficient documentation

## 2021-03-27 NOTE — Assessment & Plan Note (Signed)
Managed with spironolactone, propranolol and losartan  Lab Results  Component Value Date   NA 139 02/09/2021   K 4.2 02/09/2021   CL 100 02/09/2021   CO2 20 02/09/2021   Lab Results  Component Value Date   CREATININE 1.04 (H) 02/09/2021

## 2021-03-27 NOTE — Assessment & Plan Note (Signed)
Suggested by appearance of annular lesion on breast  Which appeared yesterday.  Will treat with terbinafine if still present in one week

## 2021-03-27 NOTE — Assessment & Plan Note (Signed)
Untreated due to patient preference  repeat level needed   Lab Results  Component Value Date   CHOL 238 (H) 02/27/2019   HDL 54.90 02/27/2019   LDLCALC 158 (H) 02/27/2019   TRIG 125.0 02/27/2019   CHOLHDL 4 02/27/2019

## 2021-03-27 NOTE — Assessment & Plan Note (Signed)
Symptoms has resolved after treatment for H Pylori gastritis

## 2021-03-29 IMAGING — MG MM DIGITAL DIAGNOSTIC UNILAT*R* W/ TOMO W/ CAD
4 series · 4 of 12 positions shown · non-contrast
Comparison: Previous exam(s).

CLINICAL DATA: Patient for short-term follow-up probably benign
right breast mass.

EXAM:
DIGITAL DIAGNOSTIC RIGHT MAMMOGRAM WITH CAD AND TOMO
ULTRASOUND RIGHT BREAST

[R MLO synth-2D]
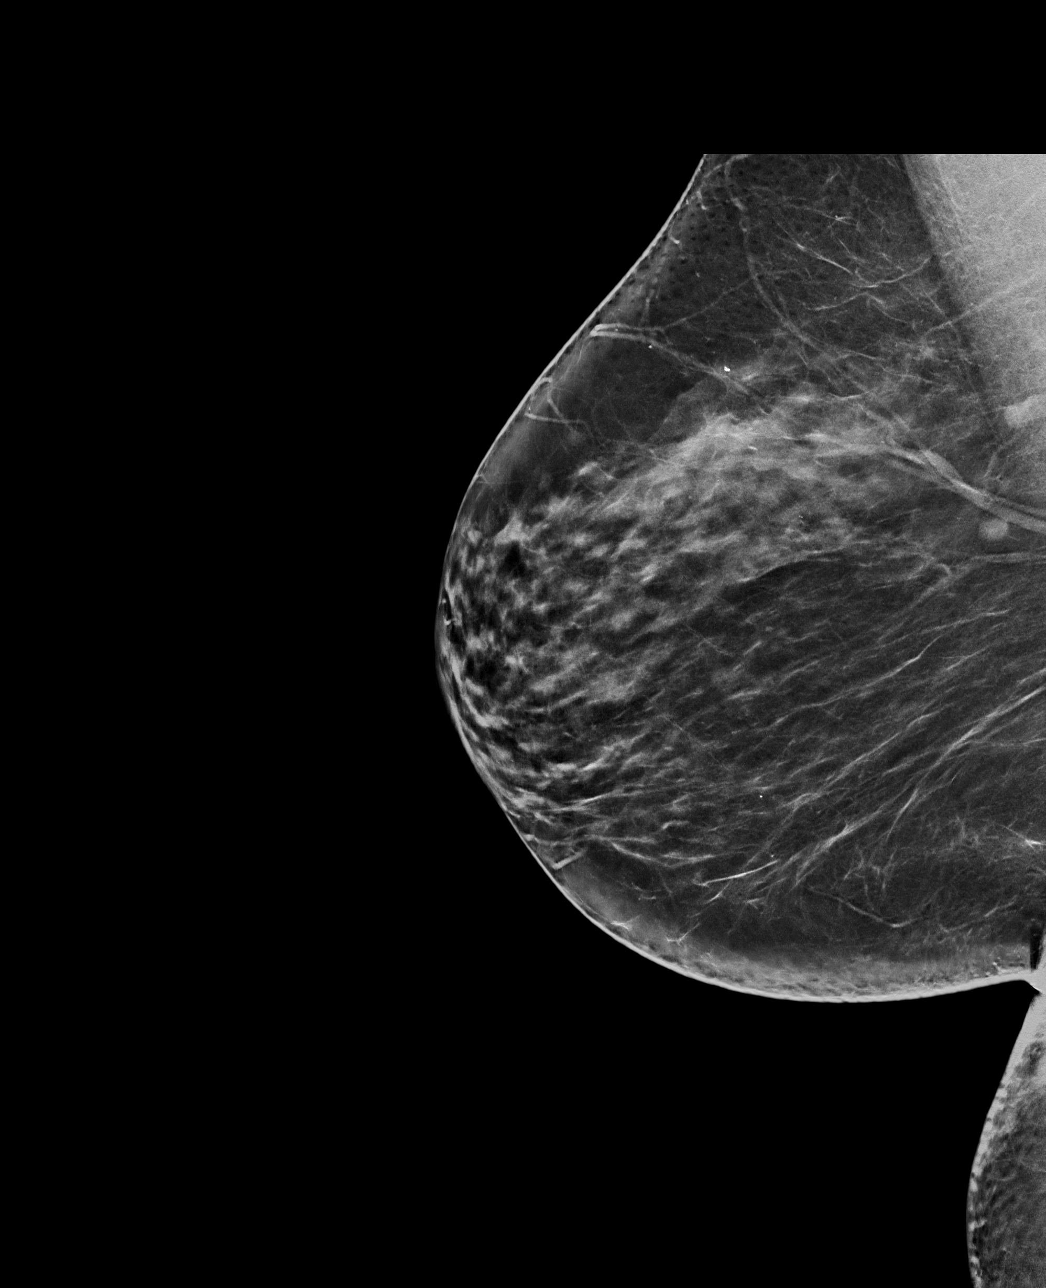

[R CC synth-2D]
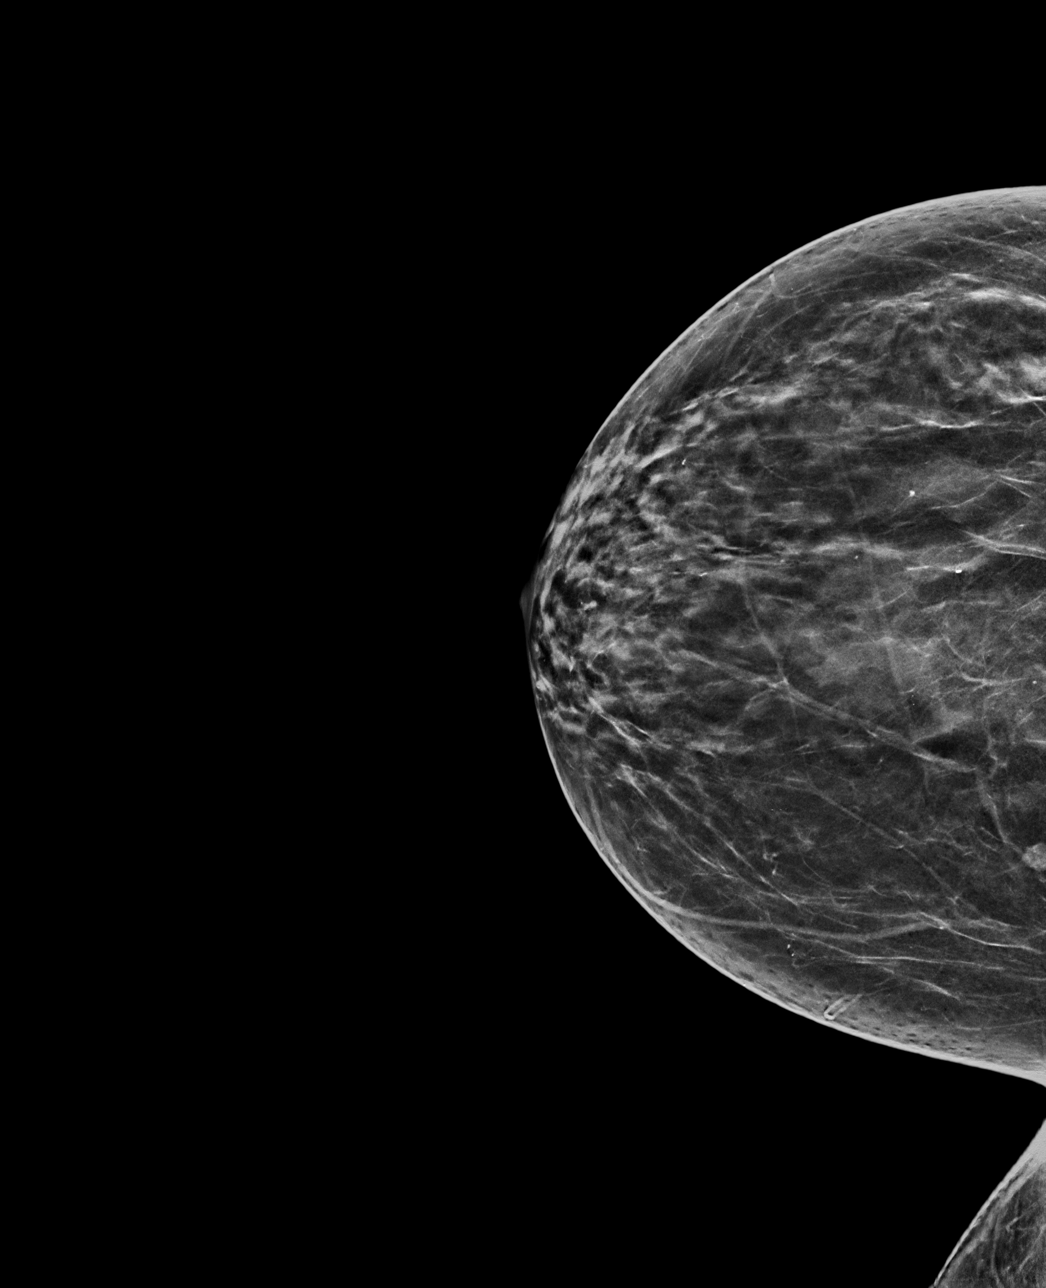

[R CC tomo · tomo slice 31/62.0]
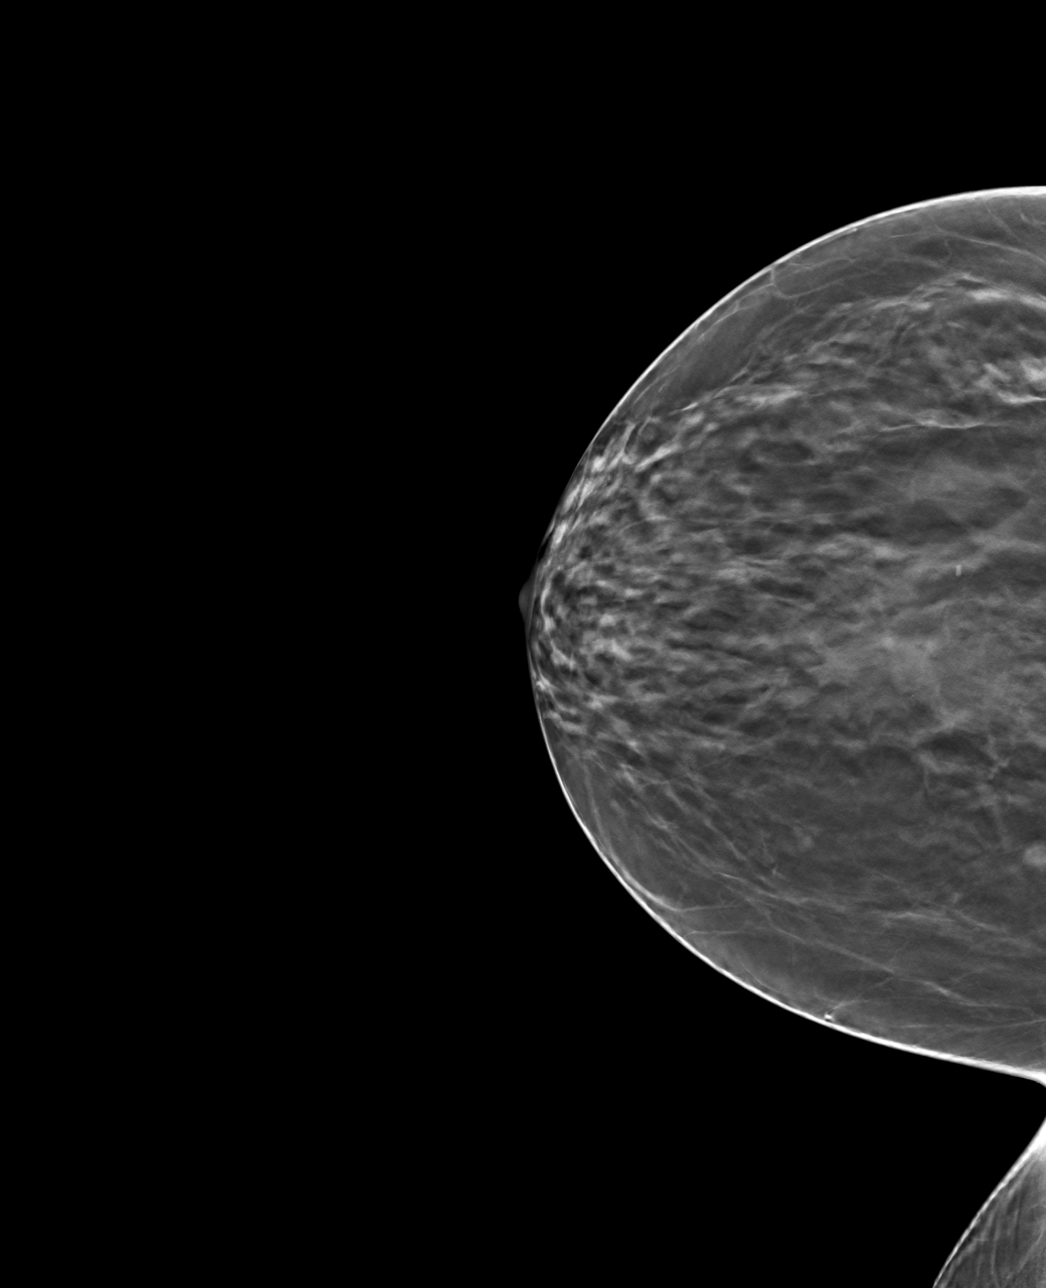

[R MLO tomo · tomo slice 37/72.0]
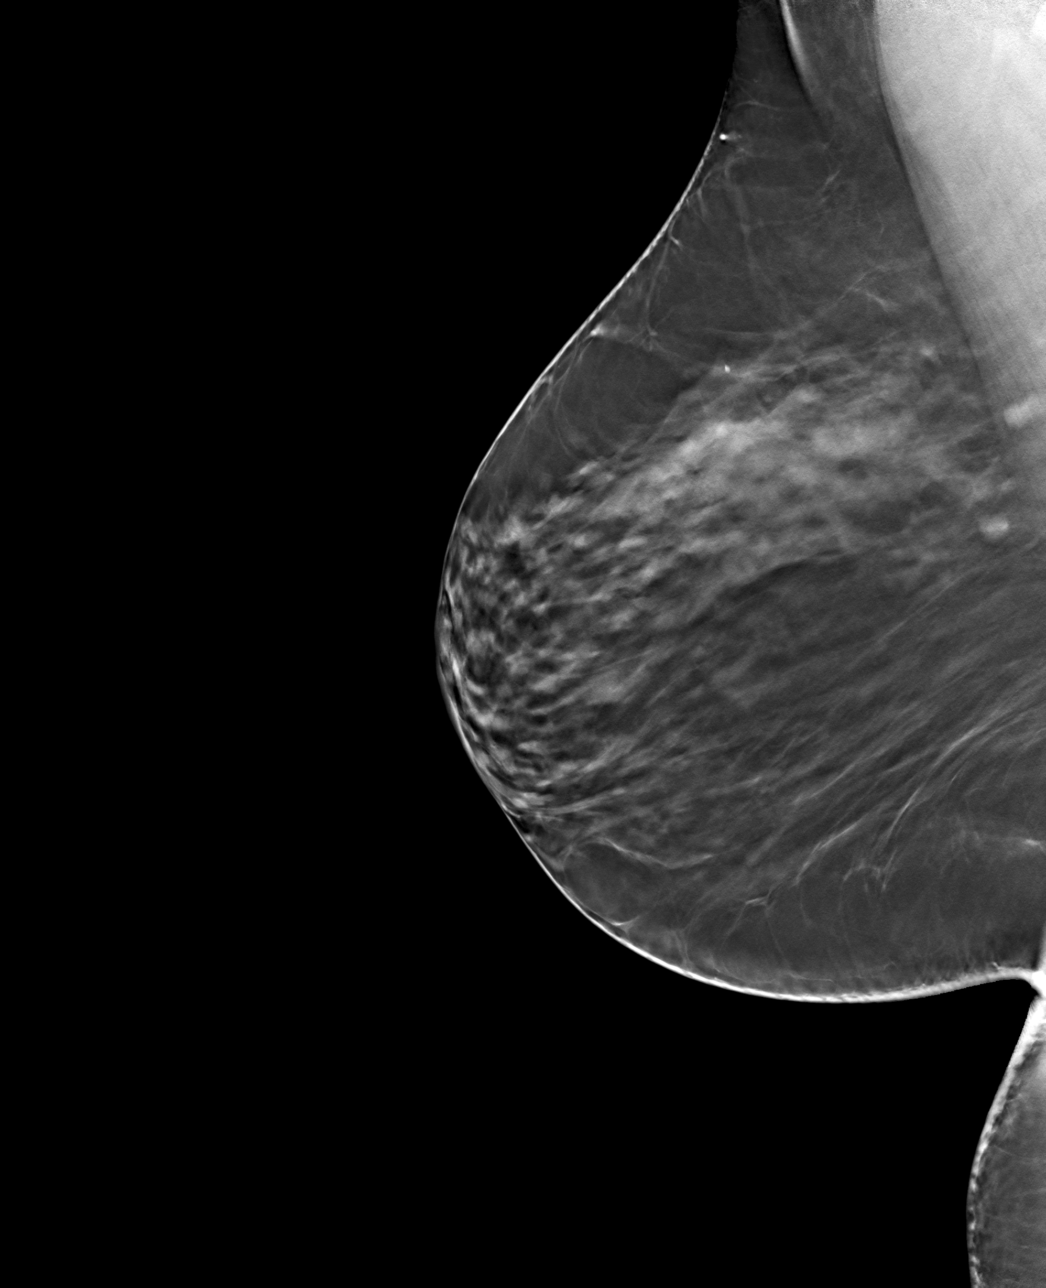

[4 of 12 positions shown; findings below may reference images not displayed]

ACR Breast Density Category c: The breast tissue is heterogeneously
dense, which may obscure small masses.
FINDINGS: Persistent oval mass within the upper inner right breast posterior
depth is unchanged when compared to most recent prior exam. No new
masses, calcifications or nonsurgical distortion identified.

Mammographic images were processed with CAD.

Targeted ultrasound is performed, showing a stable oval
circumscribed hypoechoic mass right breast 2 o'clock position 7 cm
from nipple measuring 9 x 3 x 5 mm, previously 9 x 3 x 5 mm.

There is an adjacent mass measuring 7 x 4 x 4 mm favored to
represent a cluster of cysts. This is also unchanged.
IMPRESSION: Stable probably benign right breast masses favored to represent
clusters of cysts.

RECOMMENDATION:
Bilateral diagnostic mammography with right breast ultrasound

I have discussed the findings and recommendations with the patient.
If applicable, a reminder letter will be sent to the patient
regarding the next appointment.

BI-RADS CATEGORY  3: Probably benign.

## 2021-04-02 DIAGNOSIS — M25511 Pain in right shoulder: Secondary | ICD-10-CM | POA: Diagnosis not present

## 2021-04-02 DIAGNOSIS — G8929 Other chronic pain: Secondary | ICD-10-CM | POA: Diagnosis not present

## 2021-04-07 ENCOUNTER — Telehealth: Payer: Self-pay | Admitting: Internal Medicine

## 2021-04-07 DIAGNOSIS — M25511 Pain in right shoulder: Secondary | ICD-10-CM | POA: Diagnosis not present

## 2021-04-07 DIAGNOSIS — G8929 Other chronic pain: Secondary | ICD-10-CM | POA: Diagnosis not present

## 2021-04-07 NOTE — Telephone Encounter (Signed)
Patient had surgery on 01/08/2021 and she still is having bloating and gas. She forgot to ask Dr Derrel Nip when she saw her on 03/25/2021 for some medication for the bloating and gas.

## 2021-04-07 NOTE — Telephone Encounter (Signed)
Called to speak with Elizabeth [name]. Mehgan states that she is not constipated. Gave her the providers recommendation of Gas x and Phazyme. Patient verbalized understanding and had no further questions.

## 2021-04-14 DIAGNOSIS — G8929 Other chronic pain: Secondary | ICD-10-CM | POA: Diagnosis not present

## 2021-04-14 DIAGNOSIS — M25511 Pain in right shoulder: Secondary | ICD-10-CM | POA: Diagnosis not present

## 2021-04-16 DIAGNOSIS — M25511 Pain in right shoulder: Secondary | ICD-10-CM | POA: Diagnosis not present

## 2021-04-16 DIAGNOSIS — G8929 Other chronic pain: Secondary | ICD-10-CM | POA: Diagnosis not present

## 2021-04-21 DIAGNOSIS — G8929 Other chronic pain: Secondary | ICD-10-CM | POA: Diagnosis not present

## 2021-04-21 DIAGNOSIS — M25511 Pain in right shoulder: Secondary | ICD-10-CM | POA: Diagnosis not present

## 2021-04-23 DIAGNOSIS — G8929 Other chronic pain: Secondary | ICD-10-CM | POA: Diagnosis not present

## 2021-04-23 DIAGNOSIS — M25511 Pain in right shoulder: Secondary | ICD-10-CM | POA: Diagnosis not present

## 2021-04-28 DIAGNOSIS — M25511 Pain in right shoulder: Secondary | ICD-10-CM | POA: Diagnosis not present

## 2021-04-28 DIAGNOSIS — G8929 Other chronic pain: Secondary | ICD-10-CM | POA: Diagnosis not present

## 2021-04-30 DIAGNOSIS — G8929 Other chronic pain: Secondary | ICD-10-CM | POA: Diagnosis not present

## 2021-04-30 DIAGNOSIS — M25511 Pain in right shoulder: Secondary | ICD-10-CM | POA: Diagnosis not present

## 2021-05-01 ENCOUNTER — Other Ambulatory Visit: Payer: Self-pay

## 2021-05-01 DIAGNOSIS — E0590 Thyrotoxicosis, unspecified without thyrotoxic crisis or storm: Secondary | ICD-10-CM

## 2021-05-01 MED ORDER — PROPYLTHIOURACIL 50 MG PO TABS: 50.0000 mg | ORAL_TABLET | Freq: Three times a day (TID) | ORAL | 0 refills | Status: DC

## 2021-05-05 DIAGNOSIS — M25511 Pain in right shoulder: Secondary | ICD-10-CM | POA: Diagnosis not present

## 2021-05-05 DIAGNOSIS — G8929 Other chronic pain: Secondary | ICD-10-CM | POA: Diagnosis not present

## 2021-05-06 DIAGNOSIS — S46011A Strain of muscle(s) and tendon(s) of the rotator cuff of right shoulder, initial encounter: Secondary | ICD-10-CM | POA: Diagnosis not present

## 2021-05-18 DIAGNOSIS — M25511 Pain in right shoulder: Secondary | ICD-10-CM | POA: Diagnosis not present

## 2021-05-18 DIAGNOSIS — G8929 Other chronic pain: Secondary | ICD-10-CM | POA: Diagnosis not present

## 2021-05-26 DIAGNOSIS — G8929 Other chronic pain: Secondary | ICD-10-CM | POA: Diagnosis not present

## 2021-05-26 DIAGNOSIS — M25511 Pain in right shoulder: Secondary | ICD-10-CM | POA: Diagnosis not present

## 2021-05-28 DIAGNOSIS — M25511 Pain in right shoulder: Secondary | ICD-10-CM | POA: Diagnosis not present

## 2021-05-28 DIAGNOSIS — G8929 Other chronic pain: Secondary | ICD-10-CM | POA: Diagnosis not present

## 2021-06-01 DIAGNOSIS — G8929 Other chronic pain: Secondary | ICD-10-CM | POA: Diagnosis not present

## 2021-06-01 DIAGNOSIS — M25511 Pain in right shoulder: Secondary | ICD-10-CM | POA: Diagnosis not present

## 2021-06-03 DIAGNOSIS — G8929 Other chronic pain: Secondary | ICD-10-CM | POA: Diagnosis not present

## 2021-06-03 DIAGNOSIS — S46011A Strain of muscle(s) and tendon(s) of the rotator cuff of right shoulder, initial encounter: Secondary | ICD-10-CM | POA: Diagnosis not present

## 2021-06-03 DIAGNOSIS — M25511 Pain in right shoulder: Secondary | ICD-10-CM | POA: Diagnosis not present

## 2021-06-04 ENCOUNTER — Telehealth: Payer: Self-pay | Admitting: Internal Medicine

## 2021-06-04 DIAGNOSIS — R252 Cramp and spasm: Secondary | ICD-10-CM

## 2021-06-04 NOTE — Telephone Encounter (Signed)
Access nurse instructed patient on home care and to be evaluated at [facility] due to no availability here. Patient refused to go to [facility] and wants an appointment here within a week. These sx have been ongoing for a couple weeks.

## 2021-06-04 NOTE — Telephone Encounter (Signed)
Labs ordered.  Please schedule her for OV 12:30 on Tuesday oct 4.  Labs can be done ahead of time if she has time to do this

## 2021-06-04 NOTE — Telephone Encounter (Signed)
Patient informed, Due to the high volume of calls and your symptoms we have to forward your call to our Triage Nurse to expedient your call. Please hold for the transfer.  Patient transferred to Access Nurse. Due to having muscle spasms that have been increasing the last few weeks with her fingers cramping up.No openings in office or virtual.

## 2021-06-04 NOTE — Telephone Encounter (Signed)
Pt has been schedueld for 06/09/2021 at 12:30. Pt stated that she can not due her labs before the appt. Labs will be done day of appt.

## 2021-06-09 ENCOUNTER — Other Ambulatory Visit: Payer: Self-pay

## 2021-06-09 ENCOUNTER — Ambulatory Visit: Payer: BLUE CROSS/BLUE SHIELD | Admitting: Internal Medicine

## 2021-06-09 ENCOUNTER — Encounter: Payer: Self-pay | Admitting: Internal Medicine

## 2021-06-09 VITALS — BP 132/84 | HR 64 | Temp 95.6°F | Ht 65.0 in | Wt 188.8 lb

## 2021-06-09 DIAGNOSIS — I10 Essential (primary) hypertension: Secondary | ICD-10-CM | POA: Diagnosis not present

## 2021-06-09 DIAGNOSIS — E892 Postprocedural hypoparathyroidism: Secondary | ICD-10-CM | POA: Diagnosis not present

## 2021-06-09 DIAGNOSIS — G8929 Other chronic pain: Secondary | ICD-10-CM | POA: Diagnosis not present

## 2021-06-09 DIAGNOSIS — E0590 Thyrotoxicosis, unspecified without thyrotoxic crisis or storm: Secondary | ICD-10-CM

## 2021-06-09 DIAGNOSIS — Z1331 Encounter for screening for depression: Secondary | ICD-10-CM

## 2021-06-09 DIAGNOSIS — Z9089 Acquired absence of other organs: Secondary | ICD-10-CM

## 2021-06-09 DIAGNOSIS — R21 Rash and other nonspecific skin eruption: Secondary | ICD-10-CM

## 2021-06-09 DIAGNOSIS — R252 Cramp and spasm: Secondary | ICD-10-CM | POA: Diagnosis not present

## 2021-06-09 DIAGNOSIS — M25511 Pain in right shoulder: Secondary | ICD-10-CM | POA: Diagnosis not present

## 2021-06-09 DIAGNOSIS — F320 Major depressive disorder, single episode, mild: Secondary | ICD-10-CM | POA: Insufficient documentation

## 2021-06-09 LAB — COMPREHENSIVE METABOLIC PANEL
ALT: 21 U/L (ref 0–35)
AST: 16 U/L (ref 0–37)
Albumin: 3.9 g/dL (ref 3.5–5.2)
Alkaline Phosphatase: 70 U/L (ref 39–117)
BUN: 21 mg/dL (ref 6–23)
CO2: 28 mEq/L (ref 19–32)
Calcium: 9.6 mg/dL (ref 8.4–10.5)
Chloride: 100 mEq/L (ref 96–112)
Creatinine, Ser: 1.23 mg/dL — ABNORMAL HIGH (ref 0.40–1.20)
GFR: 47.54 mL/min — ABNORMAL LOW (ref >60.00)
Glucose, Bld: 72 mg/dL (ref 70–99)
Potassium: 4.4 mEq/L (ref 3.5–5.1)
Sodium: 136 mEq/L (ref 135–145)
Total Bilirubin: 0.5 mg/dL (ref 0.2–1.2)
Total Protein: 7.1 g/dL (ref 6.0–8.3)

## 2021-06-09 LAB — TSH: TSH: 1.53 u[IU]/mL (ref 0.35–5.50)

## 2021-06-09 LAB — MAGNESIUM: Magnesium: 1.7 mg/dL (ref 1.5–2.5)

## 2021-06-09 MED ORDER — TRIAMCINOLONE ACETONIDE 0.1 % EX CREA: 1.0000 "application " | TOPICAL_CREAM | Freq: Two times a day (BID) | CUTANEOUS | 0 refills | Status: AC

## 2021-06-09 NOTE — Assessment & Plan Note (Signed)
Screening today was positive ,  She feels that her medical issues have aggravated her symptoms since she can no longer work.  She has been scheduled for follow up in one week to address with medication and counselling .  She denies suicidality

## 2021-06-09 NOTE — Patient Instructions (Addendum)
For the blood pressure:  Do not stop losartan unless BP is 100/70 or less .     For the overactive thyroid:  Continue PTU and propranolol 3 times daily  For the muscle cramps:  Start taking calcium supplement 2 times daily with food  Your muscle cramps may be due to low calcium since your parathyroid gland was removed in May.   For the rash  Start using the steroid cream twice daily on the nipple rash.  If it does not resolve after 1-2 weeks,  let me know so we can send you to a dermatologist for a biopsy

## 2021-06-09 NOTE — Assessment & Plan Note (Signed)
Suspect that her calcium has dropped since her parathyroidectomy as she has not had follow up with me, surgery or endocrinology since July  Lab Results  Component Value Date   CALCIUM 9.5 02/09/2021

## 2021-06-09 NOTE — Assessment & Plan Note (Signed)
Currently  confined to left nipple. Does not appear to be ringworm.  Trial of triamcinolone. If no resolution,  Biopsy needed

## 2021-06-09 NOTE — Progress Notes (Signed)
Subjective:  Patient ID: Elizabeth [name], female    DOB: September 09, 1960  Age: 60 y.o. MRN: [id number]  CC: The primary encounter diagnosis was Muscle cramping. Diagnoses of Essential hypertension, S/P removal of parathyroid gland ([place]), Hyperthyroidism, Rash in adult, Muscle cramps, and Positive depression screening were also pertinent to this visit.  HPI [name] presents for  Chief Complaint  Patient presents with   Acute Visit    Muscle spasms and hand cramping has gotten worse since getting a cortisone injection.    60 yr old female with hyperparathyroidism first noted in Oct 2021  managed with surveillance thus far , presents with muscle spasms and hand cramps that have become frequent   Hyperparathyroidisim was Last addressed in march at routine followup  at which time she was having confusion and bone pain ,  had not seen surgery due to fear of surgery,  urged to go ahead with surgery, given her persistently elevated calcium levels..   Had surgery in May by Dr Catalina Antigua at [facility] and has not had  a follow up calcium since June  (confirmed with ionized calcium 5.2  ).  He missed the surgery follow up.   Hands are cramping terribly,  toes and neck muscles.   Hyperthyroidism:  taking PTU tid     Treated for H pylori gastritis in June   Treated for traumatic rotator cuff tear s/p repair and tenodesis of biceps muscle in mid May 2022 by orthopedics and received a corticosteroid injection on August 31 which was complicated by gastritis and stiffness of toes and fingers PT   HTN:  takes losartan and propranolol , but had stopped the losartan "because my pressure was good"  Left nipple rash:  noted at July visit,  started after the surgery.  Developed several other places on her body (left forearm,  left upper chest wall)   all have resolved,  nipple skin still rough and occasionally itchy.  Last mammogram may 2021 ,  repeat screening has been delayed by limited mobility of right arm due to a  traumatic tear of rotator cuff    Positive screen for depression .  Not suicidal  mostly fatigue, feeling hopeless, having difficulty taking care of care of things at home.  Cannot raise right arm above shoulder level.   Outpatient Medications Prior to Visit  Medication Sig Dispense Refill   acetaminophen (TYLENOL) 500 MG tablet Take 2 tablets (1,000 mg total) by mouth every 8 (eight) hours. 90 tablet 2   cyclobenzaprine (FLEXERIL) 5 MG tablet Take 1 tablet (5 mg total) by mouth at bedtime as needed for muscle spasms. 30 tablet 2   FLUTICASONE PROPIONATE NA Place 1-2 sprays into the nose 2 (two) times daily as needed (allergies).     gabapentin (NEURONTIN) 300 MG capsule Take 300 mg by mouth daily as needed (neurpoathy).     losartan (COZAAR) 100 MG tablet Take 1 tablet (100 mg total) by mouth daily. 90 tablet 3   PARoxetine (PAXIL) 20 MG tablet Take 1 tablet (20 mg total) by mouth daily. With dinner 30 tablet 5   propranolol (INDERAL) 20 MG tablet TAKE 1 TABLET(20 MG) BY MOUTH THREE TIMES DAILY 270 tablet 1   propylthiouracil (PTU) 50 MG tablet Take 1 tablet (50 mg total) by mouth 3 (three) times daily. TAKE 1 TABLET(50 MG) BY MOUTH THREE TIMES DAILY 270 tablet 0   spironolactone (ALDACTONE) 50 MG tablet TAKE 1/2 TABLET BY MOUTH DAILY FOR 3 DAYS, THEN INCREASE TO  1 TABLET IF BLOOD PRESSURE IS GREATER THAN 130/80 DAY 4 IN THE MORNING 90 tablet 1   No facility-administered medications prior to visit.    Review of Systems;  Patient denies headache, fevers, malaise, unintentional weight loss, skin rash, eye pain, sinus congestion and sinus pain, sore throat, dysphagia,  hemoptysis , cough, dyspnea, wheezing, chest pain, palpitations, orthopnea, edema, abdominal pain, nausea, melena, diarrhea, constipation, flank pain, dysuria, hematuria, urinary  Frequency, nocturia, numbness, tingling, seizures,  Focal weakness, Loss of consciousness,  Tremor, insomnia, depression, anxiety, and suicidal ideation.       Objective:  BP 132/84 (BP Location: Left Arm, Patient Position: Sitting, Cuff Size: Large)   Pulse 64   Temp (!) 95.6 F (35.3 C) (Temporal)   Ht 5\' 5"  (1.651 m)   Wt 188 lb 12.8 oz (85.6 kg)   SpO2 98%   BMI 31.42 kg/m   BP Readings from Last 3 Encounters:  06/09/21 132/84  03/25/21 118/76  02/09/21 140/90    Wt Readings from Last 3 Encounters:  06/09/21 188 lb 12.8 oz (85.6 kg)  03/25/21 188 lb 6.4 oz (85.5 kg)  02/09/21 185 lb 9.6 oz (84.2 kg)    General appearance: alert, cooperative and appears stated age Ears: normal TM's and external ear canals both ears Throat: lips, mucosa, and tongue normal; teeth and gums normal Neck: no adenopathy, no carotid bruit, supple, symmetrical, trachea midline and thyroid not enlarged, symmetric, no tenderness/mass/nodules Breast:  left nipple with hyperpigmented papular rash at 7:00 position  Back: symmetric, no curvature. ROM normal. No CVA tenderness. Lungs: clear to auscultation bilaterally Heart: regular rate and rhythm, S1, S2 normal, no murmur, click, rub or gallop Abdomen: soft, non-tender; bowel sounds normal; no masses,  no organomegaly Pulses: 2+ and symmetric Skin: Skin color, texture, turgor normal other than left nipple.   No rashes or lesions Lymph nodes: Cervical, supraclavicular, and axillary nodes normal. MSK:  right arm with ROM limited to shoulder height flexion    Lab Results  Component Value Date   HGBA1C 5.7 08/23/2019   HGBA1C 5.8 01/23/2019    Lab Results  Component Value Date   CREATININE 1.04 (H) 02/09/2021   CREATININE 1.03 (H) 01/01/2021   CREATININE 1.08 11/05/2020    Lab Results  Component Value Date   WBC 5.5 01/01/2021   HGB 13.4 01/01/2021   HCT 40.9 01/01/2021   PLT 299 01/01/2021   GLUCOSE 92 02/09/2021   CHOL 238 (H) 02/27/2019   TRIG 125.0 02/27/2019   HDL 54.90 02/27/2019   LDLCALC 158 (H) 02/27/2019   ALT 15 02/09/2021   AST 17 02/09/2021   NA 139 02/09/2021   K 4.2  02/09/2021   CL 100 02/09/2021   CREATININE 1.04 (H) 02/09/2021   BUN 13 02/09/2021   CO2 20 02/09/2021   TSH 3.050 02/09/2021   HGBA1C 5.7 08/23/2019   MICROALBUR 3.3 (H) 08/23/2019    No results found.  Assessment & Plan:   Problem List Items Addressed This Visit       Unprioritized   Essential hypertension   Hyperthyroidism   Relevant Orders   TSH   Muscle cramps    Suspect that her calcium has dropped since her parathyroidectomy as she has not had follow up with me, surgery or endocrinology since July  Lab Results  Component Value Date   CALCIUM 9.5 02/09/2021        Positive depression screening    Screening today was positive ,  She feels  that her medical issues have aggravated her symptoms since she can no longer work.  She has been scheduled for follow up in one week to address with medication and counselling .  She denies suicidality       Rash in adult    Currently  confined to left nipple. Does not appear to be ringworm.  Trial of triamcinolone. If no resolution,  Biopsy needed       S/P removal of parathyroid gland (HCC)   Relevant Orders   Calcium, ionized   Other Visit Diagnoses     Muscle cramping    -  Primary   Relevant Orders   Calcium, ionized   Comprehensive metabolic panel   Magnesium       I spent 50 mintutes dedicated to the care of this patient on the date of this encounter to include pre-visit review of his medical history,  Face-to-face time with the patient , and post visit ordering of testing and therapeutics.  Meds ordered this encounter  Medications   triamcinolone cream (KENALOG) 0.1 %    Sig: Apply 1 application topically 2 (two) times daily.    Dispense:  30 g    Refill:  0    There are no discontinued medications.  Follow-up: No follow-ups on file.   Crecencio Mc, MD

## 2021-06-10 ENCOUNTER — Telehealth: Payer: Self-pay | Admitting: Internal Medicine

## 2021-06-10 ENCOUNTER — Other Ambulatory Visit: Payer: Self-pay | Admitting: Internal Medicine

## 2021-06-10 DIAGNOSIS — R944 Abnormal results of kidney function studies: Secondary | ICD-10-CM | POA: Insufficient documentation

## 2021-06-10 DIAGNOSIS — M542 Cervicalgia: Secondary | ICD-10-CM

## 2021-06-10 DIAGNOSIS — R252 Cramp and spasm: Secondary | ICD-10-CM

## 2021-06-10 DIAGNOSIS — N1831 Chronic kidney disease, stage 3a: Secondary | ICD-10-CM | POA: Insufficient documentation

## 2021-06-10 LAB — CALCIUM, IONIZED: Calcium, Ion: 5.14 mg/dL (ref 4.8–5.6)

## 2021-06-10 MED ORDER — GABAPENTIN 300 MG PO CAPS: 300.0000 mg | ORAL_CAPSULE | Freq: Every day | ORAL | 2 refills | Status: DC | PRN

## 2021-06-10 MED ORDER — CYCLOBENZAPRINE HCL 5 MG PO TABS: 5.0000 mg | ORAL_TABLET | Freq: Three times a day (TID) | ORAL | 2 refills | Status: DC | PRN

## 2021-06-10 NOTE — Assessment & Plan Note (Signed)
Screening labs suggestive of mild dehydration.  Advised to increase water /gatorade intake to 60 ounces daily and repet BMET in 2 weeks

## 2021-06-10 NOTE — Assessment & Plan Note (Signed)
Screening labs non diagnostic except for mild dehydration.  Advised to increase water /gatorade intake to 60 ounces daily and repet BMET in 2 weeks

## 2021-06-10 NOTE — Telephone Encounter (Signed)
Patient calling in to request the Gabapentin be filled by Dr Derrel Nip. States historical provider.   Patient wanting to know if she can take the Flexeril and the Gabapentin? If so will need the flexeril refilled also.   Patient would like this sent [facility] in [place].

## 2021-06-11 DIAGNOSIS — G8929 Other chronic pain: Secondary | ICD-10-CM | POA: Diagnosis not present

## 2021-06-11 DIAGNOSIS — M25511 Pain in right shoulder: Secondary | ICD-10-CM | POA: Diagnosis not present

## 2021-06-17 ENCOUNTER — Encounter: Payer: Self-pay | Admitting: Internal Medicine

## 2021-06-17 ENCOUNTER — Telehealth (INDEPENDENT_AMBULATORY_CARE_PROVIDER_SITE_OTHER): Payer: BLUE CROSS/BLUE SHIELD | Admitting: Internal Medicine

## 2021-06-17 DIAGNOSIS — F411 Generalized anxiety disorder: Secondary | ICD-10-CM

## 2021-06-17 DIAGNOSIS — F320 Major depressive disorder, single episode, mild: Secondary | ICD-10-CM

## 2021-06-17 MED ORDER — SERTRALINE HCL 50 MG PO TABS: 50.0000 mg | ORAL_TABLET | Freq: Every day | ORAL | 1 refills | Status: DC

## 2021-06-17 NOTE — Assessment & Plan Note (Signed)
paxil too sedating.   Changing to zoloft 50 mg starting dose

## 2021-06-17 NOTE — Assessment & Plan Note (Signed)
With concurrent depression and symptoms suggestive of obsessive worrying.  Changing from paxil to sertraline starting dose 50 mg qam.  Follow up 2 weeks

## 2021-06-17 NOTE — Progress Notes (Signed)
Virtual Visit via [facility]  Note  This visit type was conducted due to national recommendations for restrictions regarding the COVID-19 pandemic (e.g. social distancing).  This format is felt to be most appropriate for this patient at this time.  All issues noted in this document were discussed and addressed.  No physical exam was performed (except for noted visual exam findings with Video Visits).   I connected withNAME@ on 06/17/21 at  4:30 PM EDT by a video enabled telemedicine application  and verified that I am speaking with the correct person using two identifiers. Location patient: home Location provider: work or home office Persons participating in the virtual visit: patient, provider  I discussed the limitations, risks, security and privacy concerns of performing an evaluation and management service by telephone and the availability of in person appointments. I also discussed with the patient that there may be a patient responsible charge related to this service. The patient expressed understanding and agreed to proceed.   Reason for visit: worsening depression  HPI:  60 yr old female with history of GAD managed with paroxetine  since g 2021 presents for follow up on recent positive depression screen. She cites several factors that have contributed: her healthy mother died at 65 yrs of age 22 years, ago ,  and she feels the the [place] pandemic caused her death by delaying the knee replacement she was scheduled to have , which led to a worsening pf her heart condition.  She sought grief counselling at her church at the time, but since then stopped going to  church services and watched Church from home .  As of two weeks ago, she has returned to her church in person.  She also has been very disappointed by her loss of function of her arm despite undergoing shoulder surgery for rotator cuff tear, and she also underwent parathyroidectomy this summer.   She continues to worry excessively,   and her daughters have commented on her excessive worrying and "overthinking everything.".  She has had may nights of early waking but feels that the increased paxil dose makes her too sedated .  She has had no other SSRI that she can recall although she was treated for anxiety many years ago by her PCP in [place]    ROS: See pertinent positives and negatives per HPI.  Past Medical History:  Diagnosis Date   Anxiety    Arthritis    Complication of anesthesia    Slow to wake up   Dental bridge present    permanent upper   GERD (gastroesophageal reflux disease)    Gout    Hypertension    Hyperthyroidism    Thyroid disease    Vertigo     Past Surgical History:  Procedure Laterality Date   ENDOMETRIAL ABLATION  2010   PARATHYROIDECTOMY Right 01/05/2021   Procedure: RIGHT INFERIOR PARATHYROIDECTOMY;  Surgeon: Armandina Gemma, MD;  Location: [facility];  Service: General;  Laterality: Right;   SHOULDER ARTHROSCOPY WITH ROTATOR CUFF REPAIR AND SUBACROMIAL DECOMPRESSION Right 01/16/2021   Procedure: Right shoulder arthroscopic subscapularis repair, rotator cuff repair, subacromial decompression, and biceps tenodesis - Reche Dixon to Assist;  Surgeon: Leim Fabry, MD;  Location: [place];  Service: Orthopedics;  Laterality: Right;   WRIST SURGERY Right     Family History  Problem Relation Age of Onset   Hypertension Mother    Heart disease Mother    Heart failure Mother    Hypertension Father  SOCIAL HX:  reports that she quit smoking about 22 years ago. Her smoking use included cigarettes. She has never used smokeless tobacco. She reports current alcohol use. She reports that she does not use drugs.    Current Outpatient Medications:    acetaminophen (TYLENOL) 500 MG tablet, Take 2 tablets (1,000 mg total) by mouth every 8 (eight) hours., Disp: 90 tablet, Rfl: 2   cyclobenzaprine (FLEXERIL) 5 MG tablet, Take 1 tablet (5 mg total) by mouth 3 (three) times daily as needed for  muscle spasms., Disp: 90 tablet, Rfl: 2   FLUTICASONE PROPIONATE NA, Place 1-2 sprays into the nose 2 (two) times daily as needed (allergies)., Disp: , Rfl:    gabapentin (NEURONTIN) 300 MG capsule, Take 1 capsule (300 mg total) by mouth daily as needed (neurpoathy)., Disp: 30 capsule, Rfl: 2   losartan (COZAAR) 100 MG tablet, Take 1 tablet (100 mg total) by mouth daily., Disp: 90 tablet, Rfl: 3   propranolol (INDERAL) 20 MG tablet, TAKE 1 TABLET(20 MG) BY MOUTH THREE TIMES DAILY, Disp: 270 tablet, Rfl: 1   propylthiouracil (PTU) 50 MG tablet, Take 1 tablet (50 mg total) by mouth 3 (three) times daily. TAKE 1 TABLET(50 MG) BY MOUTH THREE TIMES DAILY, Disp: 270 tablet, Rfl: 0   sertraline (ZOLOFT) 50 MG tablet, Take 1 tablet (50 mg total) by mouth daily after breakfast., Disp: 90 tablet, Rfl: 1   spironolactone (ALDACTONE) 50 MG tablet, TAKE 1/2 TABLET BY MOUTH DAILY FOR 3 DAYS, THEN INCREASE TO 1 TABLET IF BLOOD PRESSURE IS GREATER THAN 130/80 DAY 4 IN THE MORNING, Disp: 90 tablet, Rfl: 1   triamcinolone cream (KENALOG) 0.1 %, Apply 1 application topically 2 (two) times daily., Disp: 30 g, Rfl: 0  EXAM:  VITALS per patient if applicable:  GENERAL: alert, oriented, appears well and in no acute distress  HEENT: atraumatic, conjunttiva clear, no obvious abnormalities on inspection of external nose and ears  NECK: normal movements of the head and neck  LUNGS: on inspection no signs of respiratory distress, breathing rate appears normal, no obvious gross SOB, gasping or wheezing  CV: no obvious cyanosis  MS: moves all visible extremities without noticeable abnormality  PSYCH/NEURO: pleasant and cooperative, no obvious depression or anxiety, speech and thought processing grossly intact  ASSESSMENT AND PLAN:  Discussed the following assessment and plan:  Generalized anxiety disorder  Depression, major, single episode, mild (HCC)  Generalized anxiety disorder With concurrent depression  and symptoms suggestive of obsessive worrying.  Changing from paxil to sertraline starting dose 50 mg qam.  Follow up 2 weeks   Depression, major, single episode, mild (HCC) paxil too sedating.   Changing to zoloft 50 mg starting dose     I discussed the assessment and treatment plan with the patient. The patient was provided an opportunity to ask questions and all were answered. The patient agreed with the plan and demonstrated an understanding of the instructions.   The patient was advised to call back or seek an in-person evaluation if the symptoms worsen or if the condition fails to improve as anticipated.   I spent 20 minutes dedicated to the care of this patient on the date of this encounter to include pre-visit review of his medical history,  Face-to-face time with the patient , and post visit ordering of testing and therapeutics.    Crecencio Mc, MD

## 2021-06-18 DIAGNOSIS — G8929 Other chronic pain: Secondary | ICD-10-CM | POA: Diagnosis not present

## 2021-06-18 DIAGNOSIS — M25511 Pain in right shoulder: Secondary | ICD-10-CM | POA: Diagnosis not present

## 2021-06-23 ENCOUNTER — Telehealth: Payer: Self-pay

## 2021-06-23 DIAGNOSIS — J069 Acute upper respiratory infection, unspecified: Secondary | ICD-10-CM

## 2021-06-23 NOTE — Telephone Encounter (Signed)
Patient called wanting to know what to take due to having a productive cough, patient believes it is her bronchitis acting up. Access nurse instructed patient on home care advise. Patietn was then instructed if sx worsen or does not get better call our office again.

## 2021-06-25 ENCOUNTER — Other Ambulatory Visit: Payer: Self-pay

## 2021-06-25 ENCOUNTER — Other Ambulatory Visit (INDEPENDENT_AMBULATORY_CARE_PROVIDER_SITE_OTHER): Payer: BLUE CROSS/BLUE SHIELD

## 2021-06-25 DIAGNOSIS — R944 Abnormal results of kidney function studies: Secondary | ICD-10-CM | POA: Diagnosis not present

## 2021-06-25 DIAGNOSIS — R252 Cramp and spasm: Secondary | ICD-10-CM | POA: Diagnosis not present

## 2021-06-25 DIAGNOSIS — R748 Abnormal levels of other serum enzymes: Secondary | ICD-10-CM

## 2021-06-25 DIAGNOSIS — J069 Acute upper respiratory infection, unspecified: Secondary | ICD-10-CM

## 2021-06-25 LAB — BASIC METABOLIC PANEL
BUN: 11 mg/dL (ref 6–23)
CO2: 27 mEq/L (ref 19–32)
Calcium: 9.6 mg/dL (ref 8.4–10.5)
Chloride: 102 mEq/L (ref 96–112)
Creatinine, Ser: 1.14 mg/dL (ref 0.40–1.20)
GFR: 52.06 mL/min — ABNORMAL LOW (ref >60.00)
Glucose, Bld: 98 mg/dL (ref 70–99)
Potassium: 4.3 mEq/L (ref 3.5–5.1)
Sodium: 138 mEq/L (ref 135–145)

## 2021-06-25 LAB — CK: Total CK: 210 U/L — ABNORMAL HIGH (ref 7–177)

## 2021-06-25 NOTE — Addendum Note (Signed)
Addended by: Crecencio Mc on: 06/25/2021 01:00 PM   Modules accepted: Orders

## 2021-06-25 NOTE — Addendum Note (Signed)
Addended by: Leeanne Rio on: 06/25/2021 02:32 PM   Modules accepted: Orders

## 2021-06-25 NOTE — Telephone Encounter (Signed)
Pt came in for labs today & thought she was seeing Dr. Derrel Nip as well. Pt states that her cough and hoarseness started on Saturday. Pt denies sore throat.  Labs were drawn & I also swabbed pt for COVID since she ended up in the lab. Please place future order if okay to send.

## 2021-06-26 LAB — NOVEL CORONAVIRUS, NAA: SARS-CoV-2, NAA: NOT DETECTED

## 2021-06-26 LAB — SARS-COV-2, NAA 2 DAY TAT

## 2021-06-28 NOTE — Addendum Note (Signed)
Addended by: Crecencio Mc on: 06/28/2021 10:06 PM   Modules accepted: Orders

## 2021-07-07 DIAGNOSIS — G8929 Other chronic pain: Secondary | ICD-10-CM | POA: Diagnosis not present

## 2021-07-07 DIAGNOSIS — M25511 Pain in right shoulder: Secondary | ICD-10-CM | POA: Diagnosis not present

## 2021-07-09 DIAGNOSIS — G8929 Other chronic pain: Secondary | ICD-10-CM | POA: Diagnosis not present

## 2021-07-09 DIAGNOSIS — M25511 Pain in right shoulder: Secondary | ICD-10-CM | POA: Diagnosis not present

## 2021-07-10 ENCOUNTER — Other Ambulatory Visit: Payer: Self-pay

## 2021-07-10 ENCOUNTER — Other Ambulatory Visit (INDEPENDENT_AMBULATORY_CARE_PROVIDER_SITE_OTHER): Payer: BLUE CROSS/BLUE SHIELD

## 2021-07-10 DIAGNOSIS — R748 Abnormal levels of other serum enzymes: Secondary | ICD-10-CM | POA: Diagnosis not present

## 2021-07-10 NOTE — Addendum Note (Signed)
Addended by: Neta Ehlers on: 07/10/2021 02:35 PM   Modules accepted: Orders

## 2021-07-10 NOTE — Addendum Note (Signed)
Addended by: Neta Ehlers on: 07/10/2021 02:36 PM   Modules accepted: Orders

## 2021-07-11 LAB — COMPREHENSIVE METABOLIC PANEL
AG Ratio: 1.2 (calc) (ref 1.0–2.5)
ALT: 14 U/L (ref 6–29)
AST: 14 U/L (ref 10–35)
Albumin: 3.8 g/dL (ref 3.6–5.1)
Alkaline phosphatase (APISO): 66 U/L (ref 37–153)
BUN/Creatinine Ratio: 14 (calc) (ref 6–22)
BUN: 15 mg/dL (ref 7–25)
CO2: 25 mmol/L (ref 20–32)
Calcium: 9.4 mg/dL (ref 8.6–10.4)
Chloride: 101 mmol/L (ref 98–110)
Creat: 1.09 mg/dL — ABNORMAL HIGH (ref 0.50–1.05)
Globulin: 3.2 g/dL (calc) (ref 1.9–3.7)
Glucose, Bld: 91 mg/dL (ref 65–99)
Potassium: 3.9 mmol/L (ref 3.5–5.3)
Sodium: 136 mmol/L (ref 135–146)
Total Bilirubin: 0.4 mg/dL (ref 0.2–1.2)
Total Protein: 7 g/dL (ref 6.1–8.1)

## 2021-07-11 LAB — CK: Total CK: 93 U/L (ref 29–143)

## 2021-07-15 DIAGNOSIS — S46011A Strain of muscle(s) and tendon(s) of the rotator cuff of right shoulder, initial encounter: Secondary | ICD-10-CM | POA: Diagnosis not present

## 2021-07-15 DIAGNOSIS — G8929 Other chronic pain: Secondary | ICD-10-CM | POA: Diagnosis not present

## 2021-07-15 DIAGNOSIS — M25511 Pain in right shoulder: Secondary | ICD-10-CM | POA: Diagnosis not present

## 2021-07-24 ENCOUNTER — Telehealth: Payer: Self-pay | Admitting: Internal Medicine

## 2021-07-24 NOTE — Telephone Encounter (Signed)
Spoke with pt and she stated that she has had a cough for about four weeks now. She stated that she has now developed PND and very hoarse voice. Pt stated that she believes it has turned to Laryngitis. Pt is wanting to know if something can be called in. Pt has no fever and no other symptoms.

## 2021-07-24 NOTE — Telephone Encounter (Signed)
Pt called in regards to her voice. Pt has severe hoarseness in her voice and an uncontrollable dry cough. Pt is wondering if she can be prescribed medication. Pt has been experiencing this for over four weeks now.

## 2021-07-24 NOTE — Telephone Encounter (Signed)
Left message for patient to return call back.  

## 2021-07-27 NOTE — Telephone Encounter (Signed)
Patient has schedule appointment with michelle for tomorrow due to her sx.

## 2021-07-28 ENCOUNTER — Other Ambulatory Visit: Payer: Self-pay

## 2021-07-28 ENCOUNTER — Ambulatory Visit: Payer: BLUE CROSS/BLUE SHIELD | Admitting: Adult Health

## 2021-07-28 ENCOUNTER — Encounter: Payer: Self-pay | Admitting: Adult Health

## 2021-07-28 ENCOUNTER — Ambulatory Visit (INDEPENDENT_AMBULATORY_CARE_PROVIDER_SITE_OTHER): Payer: BLUE CROSS/BLUE SHIELD

## 2021-07-28 VITALS — BP 138/84 | HR 58 | Temp 95.9°F | Ht 65.0 in | Wt 188.8 lb

## 2021-07-28 DIAGNOSIS — R051 Acute cough: Secondary | ICD-10-CM

## 2021-07-28 DIAGNOSIS — R490 Dysphonia: Secondary | ICD-10-CM | POA: Diagnosis not present

## 2021-07-28 DIAGNOSIS — R059 Cough, unspecified: Secondary | ICD-10-CM | POA: Diagnosis not present

## 2021-07-28 DIAGNOSIS — J0100 Acute maxillary sinusitis, unspecified: Secondary | ICD-10-CM | POA: Insufficient documentation

## 2021-07-28 LAB — CBC WITH DIFFERENTIAL/PLATELET
Basophils Absolute: 0 10*3/uL (ref 0.0–0.1)
Basophils Relative: 0.5 % (ref 0.0–3.0)
Eosinophils Absolute: 0.1 10*3/uL (ref 0.0–0.7)
Eosinophils Relative: 1.4 % (ref 0.0–5.0)
HCT: 39.3 % (ref 36.0–46.0)
Hemoglobin: 12.6 g/dL (ref 12.0–15.0)
Lymphocytes Relative: 38.7 % (ref 12.0–46.0)
Lymphs Abs: 2.7 10*3/uL (ref 0.7–4.0)
MCHC: 32.1 g/dL (ref 30.0–36.0)
MCV: 78.7 fl (ref 78.0–100.0)
Monocytes Absolute: 0.5 10*3/uL (ref 0.1–1.0)
Monocytes Relative: 7 % (ref 3.0–12.0)
Neutro Abs: 3.7 10*3/uL (ref 1.4–7.7)
Neutrophils Relative %: 52.4 % (ref 43.0–77.0)
Platelets: 282 10*3/uL (ref 150.0–400.0)
RBC: 4.99 Mil/uL (ref 3.87–5.11)
RDW: 15.1 % (ref 11.5–15.5)
WBC: 7.1 10*3/uL (ref 4.0–10.5)

## 2021-07-28 LAB — POCT INFLUENZA A/B
Influenza A, POC: NEGATIVE
Influenza B, POC: NEGATIVE

## 2021-07-28 MED ORDER — BENZONATATE 100 MG PO CAPS
100.0000 mg | ORAL_CAPSULE | Freq: Two times a day (BID) | ORAL | 0 refills | Status: DC | PRN
Start: 2021-07-28 — End: 2021-11-10

## 2021-07-28 MED ORDER — AMOXICILLIN-POT CLAVULANATE 875-125 MG PO TABS: 1.0000 | ORAL_TABLET | Freq: Two times a day (BID) | ORAL | 0 refills | Status: DC

## 2021-07-28 MED ORDER — CETIRIZINE HCL 10 MG PO TABS: 10.0000 mg | ORAL_TABLET | Freq: Every day | ORAL | 11 refills | Status: DC

## 2021-07-28 MED ORDER — PREDNISONE 10 MG PO TABS: ORAL_TABLET | ORAL | 0 refills | Status: AC

## 2021-07-28 NOTE — Progress Notes (Signed)
Acute Office Visit  Subjective:    Patient ID: Elizabeth [name], female    DOB: 01/06/1961, 60 y.o.   MRN: [id number]  Chief Complaint  Patient presents with   Hoarse        Cough    She reports she started with symptoms of cough, congestion. Hoarseness started  October 24 th 2022. She testes negative for covid then.  URI  This is a new problem. The current episode started 1 to 4 weeks ago (tested negative for covid.). The problem has been gradually worsening. There has been no fever. Associated symptoms include congestion, coughing, ear pain, a plugged ear sensation, rhinorrhea and sinus pain. Pertinent negatives include no abdominal pain, chest pain, diarrhea, dysuria, headaches, joint pain, joint swelling, nausea, neck pain, rash, sneezing, sore throat, swollen glands, vomiting or wheezing. She has tried decongestant (mucinex) for the symptoms. The treatment provided mild relief.   She does have a history of hypercalcemia and elevated parathyroid hormone level, with a right inferior parathyroid adenoma, she had a minimally invasive right inferior parathyroidectomy as an outpatient procedure with Dr. Armandina Gemma.  Reviewed pathology report as below.  Patient reports that after her surgery she was worse for a week or 2 but then that resolved completely surgery was Jan 05, 2021.  She had no persistent hoarseness from that time forward. FINAL MICROSCOPIC DIAGNOSIS:   A. PARATHYROID, RIGHT INFERIOR, PARATHYROIDECTOMY:  -  Hypercellular parathyroid tissue (2.753 g) consistent with adenoma  INTRAOPERATIVE DIAGNOSIS:  A. Right inferior parathyroid adenoma: "Parathyroid adenoma"  Intraoperative diagnosis rendered by Dr. Saralyn Pilar at 8:30 AM on 01/05/2021.   She does have some seasonal allergies. She is not taking zyrtec.   Denies any edema or swelling. Mild fatigue noted occasionally. Denies any problems swallowing pain and neck or discomfort. Denies dysphagia. Denies any wheezing. Patient   denies any fever, body aches,chills, rash, chest pain, shortness of breath, nausea, vomiting, or diarrhea.   Past Medical History:  Diagnosis Date   Anxiety    Arthritis    Complication of anesthesia    Slow to wake up   Dental bridge present    permanent upper   GERD (gastroesophageal reflux disease)    Gout    Hypertension    Hyperthyroidism    Thyroid disease    Vertigo     Past Surgical History:  Procedure Laterality Date   ENDOMETRIAL ABLATION  2010   PARATHYROIDECTOMY Right 01/05/2021   Procedure: RIGHT INFERIOR PARATHYROIDECTOMY;  Surgeon: Armandina Gemma, MD;  Location: [facility];  Service: General;  Laterality: Right;   SHOULDER ARTHROSCOPY WITH ROTATOR CUFF REPAIR AND SUBACROMIAL DECOMPRESSION Right 01/16/2021   Procedure: Right shoulder arthroscopic subscapularis repair, rotator cuff repair, subacromial decompression, and biceps tenodesis - Reche Dixon to Assist;  Surgeon: Leim Fabry, MD;  Location: [place];  Service: Orthopedics;  Laterality: Right;   WRIST SURGERY Right     Family History  Problem Relation Age of Onset   Hypertension Mother    Heart disease Mother    Heart failure Mother    Hypertension Father     Social History   Socioeconomic History   Marital status: Married    Spouse name: Not on file   Number of children: Not on file   Years of education: Not on file   Highest education level: Not on file  Occupational History   Not on file  Tobacco Use   Smoking status: Former    Types: Cigarettes    Quit  date: 01/12/1999    Years since quitting: 22.5   Smokeless tobacco: Never  Vaping Use   Vaping Use: Never used  Substance and Sexual Activity   Alcohol use: Yes    Comment: occ   Drug use: Never   Sexual activity: Not on file  Other Topics Concern   Not on file  Social History Narrative   Lives with husband Herbie [place] (also a pt Writer)   Right handed   Social Determinants of Health   Financial Resource Strain:  Not on file  Food Insecurity: Not on file  Transportation Needs: Not on file  Physical Activity: Not on file  Stress: Not on file  Social Connections: Not on file  Intimate Partner Violence: Not on file    Outpatient Medications Prior to Visit  Medication Sig Dispense Refill   acetaminophen (TYLENOL) 500 MG tablet Take 2 tablets (1,000 mg total) by mouth every 8 (eight) hours. 90 tablet 2   cyclobenzaprine (FLEXERIL) 5 MG tablet Take 1 tablet (5 mg total) by mouth 3 (three) times daily as needed for muscle spasms. 90 tablet 2   FLUTICASONE PROPIONATE NA Place 1-2 sprays into the nose 2 (two) times daily as needed (allergies).     gabapentin (NEURONTIN) 300 MG capsule Take 1 capsule (300 mg total) by mouth daily as needed (neurpoathy). 30 capsule 2   losartan (COZAAR) 100 MG tablet Take 1 tablet (100 mg total) by mouth daily. 90 tablet 3   propranolol (INDERAL) 20 MG tablet TAKE 1 TABLET(20 MG) BY MOUTH THREE TIMES DAILY 270 tablet 1   propylthiouracil (PTU) 50 MG tablet Take 1 tablet (50 mg total) by mouth 3 (three) times daily. TAKE 1 TABLET(50 MG) BY MOUTH THREE TIMES DAILY 270 tablet 0   sertraline (ZOLOFT) 50 MG tablet Take 1 tablet (50 mg total) by mouth daily after breakfast. 90 tablet 1   spironolactone (ALDACTONE) 50 MG tablet TAKE 1/2 TABLET BY MOUTH DAILY FOR 3 DAYS, THEN INCREASE TO 1 TABLET IF BLOOD PRESSURE IS GREATER THAN 130/80 DAY 4 IN THE MORNING 90 tablet 1   triamcinolone cream (KENALOG) 0.1 % Apply 1 application topically 2 (two) times daily. 30 g 0   No facility-administered medications prior to visit.    Allergies  Allergen Reactions   Codeine     Groggy   Metoprolol     SOB   Tramadol Other (See Comments)    Made her a zombie     Review of Systems  Constitutional:  Positive for fatigue. Negative for activity change, appetite change, chills, diaphoresis, fever and unexpected weight change.  HENT:  Positive for congestion, ear pain, postnasal drip,  rhinorrhea, sinus pressure, sinus pain and voice change (hoarseness reported for 4 weeks, not worsening, no difficulty or problems swallowing per patient.). Negative for dental problem, drooling, ear discharge, facial swelling, hearing loss, mouth sores, nosebleeds, sneezing, sore throat, tinnitus and trouble swallowing.   Respiratory:  Positive for cough. Negative for apnea, choking, chest tightness, shortness of breath, wheezing and stridor.   Cardiovascular:  Negative for chest pain, palpitations and leg swelling.  Gastrointestinal: Negative.  Negative for abdominal pain, diarrhea, nausea and vomiting.  Endocrine: Negative for cold intolerance, heat intolerance, polydipsia, polyphagia and polyuria.  Genitourinary: Negative.  Negative for dysuria.  Musculoskeletal: Negative.  Negative for joint pain and neck pain.  Skin: Negative.  Negative for rash.  Neurological: Negative.  Negative for headaches.  Psychiatric/Behavioral: Negative.        Objective:  Physical Exam Vitals reviewed.  Constitutional:      General: She is not in acute distress.    Appearance: She is obese. She is not ill-appearing, toxic-appearing or diaphoretic.  HENT:     Head: Normocephalic and atraumatic.     Jaw: There is normal jaw occlusion.     Salivary Glands: Right salivary gland is not diffusely enlarged or tender. Left salivary gland is not diffusely enlarged or tender.     Right Ear: A middle ear effusion is present. Tympanic membrane is erythematous.     Left Ear: A middle ear effusion is present. Tympanic membrane is not erythematous.     Nose: Congestion and rhinorrhea present.     Right Turbinates: Not enlarged, swollen or pale.     Left Turbinates: Not enlarged, swollen or pale.     Right Sinus: Maxillary sinus tenderness present.     Left Sinus: Maxillary sinus tenderness present. No frontal sinus tenderness.     Mouth/Throat:     Pharynx: Uvula midline. Posterior oropharyngeal erythema present. No  pharyngeal swelling, oropharyngeal exudate or uvula swelling.     Tonsils: No tonsillar exudate.  Neck:     Thyroid: No thyroid mass or thyroid tenderness.     Trachea: Trachea normal. No tracheal tenderness or tracheal deviation.     Comments: Mild hoarseness of voice  Cardiovascular:     Rate and Rhythm: Normal rate and regular rhythm.     Pulses: Normal pulses.     Heart sounds: Normal heart sounds. No murmur heard.   No friction rub. No gallop.  Pulmonary:     Effort: Pulmonary effort is normal. No respiratory distress.     Breath sounds: Normal breath sounds. No stridor. No wheezing, rhonchi or rales.  Chest:     Chest wall: No tenderness.  Abdominal:     General: There is no distension.     Palpations: Abdomen is soft.     Tenderness: There is no abdominal tenderness.    BP 138/84   Pulse (!) 58   Temp (!) 95.9 F (35.5 C)   Ht _0  (1.651 m)   Wt 188 lb 12.8 oz (85.6 kg)   SpO2 97%   BMI 31.42 kg/m  Wt Readings from Last 3 Encounters:  07/28/21 188 lb 12.8 oz (85.6 kg)  06/17/21 188 lb (85.3 kg)  06/09/21 188 lb 12.8 oz (85.6 kg)    Health Maintenance Due  Topic Date Due   Zoster Vaccines- Shingrix (1 of 2) Never done   COVID-19 Vaccine (3 - Booster for Pfizer series) 04/21/2020    There are no preventive care reminders to display for this patient.   Lab Results  Component Value Date   TSH 1.53 06/09/2021   Lab Results  Component Value Date   WBC 5.5 01/01/2021   HGB 13.4 01/01/2021   HCT 40.9 01/01/2021   MCV 80.4 01/01/2021   PLT 299 01/01/2021   Lab Results  Component Value Date   NA 136 07/10/2021   K 3.9 07/10/2021   CO2 25 07/10/2021   GLUCOSE 91 07/10/2021   BUN 15 07/10/2021   CREATININE 1.09 (H) 07/10/2021   BILITOT 0.4 07/10/2021   ALKPHOS 70 06/09/2021   AST 14 07/10/2021   ALT 14 07/10/2021   PROT 7.0 07/10/2021   ALBUMIN 3.9 06/09/2021   CALCIUM 9.4 07/10/2021   ANIONGAP 7 01/01/2021   EGFR 62 02/09/2021   GFR 52.06 (L)  06/25/2021   Lab Results  Component Value Date   CHOL 238 (H) 02/27/2019   Lab Results  Component Value Date   HDL 54.90 02/27/2019   Lab Results  Component Value Date   LDLCALC 158 (H) 02/27/2019   Lab Results  Component Value Date   TRIG 125.0 02/27/2019   Lab Results  Component Value Date   CHOLHDL 4 02/27/2019   Lab Results  Component Value Date   HGBA1C 5.7 08/23/2019       Assessment & Plan:   Problem List Items Addressed This Visit       Respiratory   Acute non-recurrent maxillary sinusitis - Primary   Relevant Medications   cetirizine (ZYRTEC) 10 MG tablet   amoxicillin-clavulanate (AUGMENTIN) 875-125 MG tablet   predniSONE (DELTASONE) 10 MG tablet   benzonatate (TESSALON) 100 MG capsule   Other Relevant Orders   CBC with Differential/Platelet   POCT Influenza A/B     Other   Acute cough   Relevant Medications   cetirizine (ZYRTEC) 10 MG tablet   predniSONE (DELTASONE) 10 MG tablet   benzonatate (TESSALON) 100 MG capsule   Other Relevant Orders   CBC with Differential/Platelet   DG Chest 2 View   POCT Influenza A/B   Hoarseness of voice- last 4 weeks seen on 07/28/21     Meds ordered this encounter  Medications   cetirizine (ZYRTEC) 10 MG tablet    Sig: Take 1 tablet (10 mg total) by mouth daily.    Dispense:  30 tablet    Refill:  11   amoxicillin-clavulanate (AUGMENTIN) 875-125 MG tablet    Sig: Take 1 tablet by mouth 2 (two) times daily.    Dispense:  20 tablet    Refill:  0   predniSONE (DELTASONE) 10 MG tablet    Sig: Take 3 tablets (30 mg total) by mouth daily with breakfast for 3 days, THEN 2 tablets (20 mg total) daily with breakfast for 2 days, THEN 1 tablet (10 mg total) daily with breakfast for 1 day.    Dispense:  14 tablet    Refill:  0   benzonatate (TESSALON) 100 MG capsule    Sig: Take 1 capsule (100 mg total) by mouth 2 (two) times daily as needed for cough.    Dispense:  20 capsule    Refill:  0   Recommended that  patient does start her Zyrtec back since she was previously on this, sent refills to her pharmacy.  Will prescribe for acute sinusitis given her duration of symptoms for 4 weeks she does have sinus pressure and pain as well as congestion and erythematous tympanic membrane.  Sent Augmentin discussed side effects of C. difficile and other side effects of medication.  Sent low-dose prednisone as that she has a history of tachycardia in the past we will not do prednisone Dosepak.  Tessalon Perles also sent for cough. Patient is advised that given her history of the parathyroid adenoma that if her voice does not return to completely normal she should follow-up after this treatment.  Patient verbalized understanding of this and she will keep her primary care follow-ups with Dr. Derrel Nip.  Return if symptoms worsen or fail to improve, for at any time for any worsening symptoms, Go to Emergency room/ urgent care if worse.   Red Flags discussed. The patient was given clear instructions to go to ER or return to medical center if any red flags develop, symptoms do not improve, worsen or new problems develop. They verbalized understanding.  Marcille Buffy, FNP

## 2021-07-28 NOTE — Patient Instructions (Signed)
Benzonatate Capsules What is this medication? BENZONATATE (ben ZOE na tate) is used to relieve cough. It works by calming your cough reflex. It belongs to a group of medications called cough suppressants. This medicine may be used for other purposes; ask your health care provider or pharmacist if you have questions. COMMON BRAND NAME(S): Tessalon Perles, Zonatuss What should I tell my care team before I take this medication? They need to know if you have any of these conditions: Kidney or liver disease An unusual or allergic reaction to benzonatate, anesthetics, other medications, foods, dyes, or preservatives Pregnant or trying to get pregnant Breast-feeding How should I use this medication? Take this medication by mouth with a glass of water. Follow the directions on the prescription label. Avoid breaking, chewing, or sucking the capsule, as this can cause serious side effects. Take your medication at regular intervals. Do not take your medication more often than directed. Talk to your care team about the use of this medication in children. While this medication may be prescribed for children as young as 98 years old for selected conditions, precautions do apply. Overdosage: If you think you have taken too much of this medicine contact a poison control center or emergency room at once. NOTE: This medicine is only for you. Do not share this medicine with others. What if I miss a dose? If you miss a dose, take it as soon as you can. If it is almost time for your next dose, take only that dose. Do not take double or extra doses. What may interact with this medication? Do not take this medication with any of the following: MAOIs like Carbex, Eldepryl, Marplan, Nardil, and Parnate This list may not describe all possible interactions. Give your health care provider a list of all the medicines, herbs, non-prescription drugs, or dietary supplements you use. Also tell them if you smoke, drink alcohol,  or use illegal drugs. Some items may interact with your medicine. What should I watch for while using this medication? Tell your care team if your symptoms do not improve or if they get worse. If you have a high fever, skin rash, or headache, see your care team. You may get drowsy or dizzy. Do not drive, use machinery, or do anything that needs mental alertness until you know how this medication affects you. Do not sit or stand up quickly, especially if you are an older patient. This reduces the risk of dizzy or fainting spells. What side effects may I notice from receiving this medication? Side effects that you should report to your care team as soon as possible: Allergic reactions--skin rash, itching, hives, swelling of the face, lips, tongue, or throat Confusion Hallucinations Side effects that usually do not require medical attention (report to your care team if they continue or are bothersome): Burning or tingling of the tongue, mouth, throat, or face Dizziness Drowsiness This list may not describe all possible side effects. Call your doctor for medical advice about side effects. You may report side effects to FDA at 1-800-FDA-1088. Where should I keep my medication? Keep out of the reach of children. Store at room temperature between 15 and 30 degrees C (59 and 86 degrees F). Keep tightly closed. Protect from light and moisture. Throw away any unused medication after the expiration date. NOTE: This sheet is a summary. It may not cover all possible information. If you have questions about this medicine, talk to your doctor, pharmacist, or health care provider.  2022 Elsevier/Gold Standard (2020-12-03  00:00:00) Prednisone Tablets What is this medication? PREDNISONE (PRED ni sone) treats many conditions such as asthma, allergic reactions, arthritis, inflammatory bowel diseases, adrenal, and blood or bone marrow disorders. It works by decreasing inflammation, slowing down an overactive immune  system, or replacing cortisol normally made in the body. Cortisol is a hormone that plays an important role in how the body responds to stress, illness, and injury. It belongs to a group of medications called steroids. This medicine may be used for other purposes; ask your health care provider or pharmacist if you have questions. COMMON BRAND NAME(S): Deltasone, Predone, Sterapred, Sterapred DS What should I tell my care team before I take this medication? They need to know if you have any of these conditions: Cushing's syndrome Diabetes Glaucoma Heart disease High blood pressure Infection (especially a virus infection such as chickenpox, cold sores, or herpes) Kidney disease Liver disease Mental illness Myasthenia gravis Osteoporosis Seizures Stomach or intestine problems Thyroid disease An unusual or allergic reaction to lactose, prednisone, other medications, foods, dyes, or preservatives Pregnant or trying to get pregnant Breast-feeding How should I use this medication? Take this medication by mouth with a glass of water. Follow the directions on the prescription label. Take this medication with food. If you are taking this medication once a day, take it in the morning. Do not take more medication than you are told to take. Do not suddenly stop taking your medication because you may develop a severe reaction. Your care team will tell you how much medication to take. If your care team wants you to stop the medication, the dose may be slowly lowered over time to avoid any side effects. Talk to your care team about the use of this medication in children. Special care may be needed. Overdosage: If you think you have taken too much of this medicine contact a poison control center or emergency room at once. NOTE: This medicine is only for you. Do not share this medicine with others. What if I miss a dose? If you miss a dose, take it as soon as you can. If it is almost time for your next  dose, talk to your care team. You may need to miss a dose or take an extra dose. Do not take double or extra doses without advice. What may interact with this medication? Do not take this medication with any of the following: Metyrapone Mifepristone This medication may also interact with the following: Aminoglutethimide Amphotericin B Aspirin and aspirin-like medications Barbiturates Certain medications for diabetes, like glipizide or glyburide Cholestyramine Cholinesterase inhibitors Cyclosporine Digoxin Diuretics Ephedrine Female hormones, like estrogens and birth control pills Isoniazid Ketoconazole NSAIDS, medications for pain and inflammation, like ibuprofen or naproxen Phenytoin Rifampin Toxoids Vaccines Warfarin This list may not describe all possible interactions. Give your health care provider a list of all the medicines, herbs, non-prescription drugs, or dietary supplements you use. Also tell them if you smoke, drink alcohol, or use illegal drugs. Some items may interact with your medicine. What should I watch for while using this medication? Visit your care team for regular checks on your progress. If you are taking this medication over a prolonged period, carry an identification card with your name and address, the type and dose of your medication, and your care team's name and address. This medication may increase your risk of getting an infection. Tell your care team if you are around anyone with measles or chickenpox, or if you develop sores or blisters that do not  heal properly. If you are going to have surgery, tell your care team that you have taken this medication within the last twelve months. Ask your care team about your diet. You may need to lower the amount of salt you eat. This medication may increase blood sugar. Ask your care team if changes in diet or medications are needed if you have diabetes. What side effects may I notice from receiving this  medication? Side effects that you should report to your care team as soon as possible: Allergic reactions--skin rash, itching, hives, swelling of the face, lips, tongue, or throat Cushing syndrome--increased fat around the midsection, upper back, neck, or face, pink or purple stretch marks on the skin, thinning, fragile skin that easily bruises, unexpected hair growth High blood sugar (hyperglycemia)--increased thirst or amount of urine, unusual weakness or fatigue, blurry vision Increase in blood pressure Infection--fever, chills, cough, sore throat, wounds that don't heal, pain or trouble when passing urine, general feeling of discomfort or being unwell Low adrenal gland function--nausea, vomiting, loss of appetite, unusual weakness or fatigue, dizziness Mood and behavior changes--anxiety, nervousness, confusion, hallucinations, irritability, hostility, thoughts of suicide or self-harm, worsening mood, feelings of depression Stomach bleeding--bloody or black, tar-like stools, vomiting blood or brown material that looks like coffee grounds Swelling of the ankles, hands, or feet Side effects that usually do not require medical attention (report to your care team if they continue or are bothersome): Acne General discomfort and fatigue Headache Increase in appetite Nausea Trouble sleeping Weight gain This list may not describe all possible side effects. Call your doctor for medical advice about side effects. You may report side effects to FDA at 1-800-FDA-1088. Where should I keep my medication? Keep out of the reach of children. Store at room temperature between 15 and 30 degrees C (59 and 86 degrees F). Protect from light. Keep container tightly closed. Throw away any unused medication after the expiration date. NOTE: This sheet is a summary. It may not cover all possible information. If you have questions about this medicine, talk to your doctor, pharmacist, or health care provider.  2022  Elsevier/Gold Standard (2020-11-21 00:00:00) Amoxicillin; Clavulanic Acid Tablets What is this medication? AMOXICILLIN; CLAVULANIC ACID (a mox i SIL in; KLAV yoo lan ic AS id) treats infections caused by bacteria. It belongs to a group of medications called penicillin antibiotics. It will not treat colds, the flu, or infections caused by viruses. This medicine may be used for other purposes; ask your health care provider or pharmacist if you have questions. COMMON BRAND NAME(S): Augmentin What should I tell my care team before I take this medication? They need to know if you have any of these conditions: Kidney disease Liver disease Mononucleosis Stomach or intestine problems such as colitis An unusual or allergic reaction to amoxicillin, other penicillin or cephalosporin antibiotics, clavulanic acid, other medications, foods, dyes, or preservatives Pregnant or trying to get pregnant Breast-feeding How should I use this medication? Take this medication by mouth. Take it as directed on the prescription label at the same time every day. Take it with food at the start of a meal or snack. Take all of this medication unless your care team tells you to stop it early. Keep taking it even if you think you are better. Talk to your care team about the use of this medication in children. While it may be prescribed for selected conditions, precautions do apply. Overdosage: If you think you have taken too much of this medicine contact  a poison control center or emergency room at once. NOTE: This medicine is only for you. Do not share this medicine with others. What if I miss a dose? If you miss a dose, take it as soon as you can. If it is almost time for your next dose, take only that dose. Do not take double or extra doses. What may interact with this medication? Allopurinol Anticoagulants Birth control pills Methotrexate Probenecid This list may not describe all possible interactions. Give your  health care provider a list of all the medicines, herbs, non-prescription drugs, or dietary supplements you use. Also tell them if you smoke, drink alcohol, or use illegal drugs. Some items may interact with your medicine. What should I watch for while using this medication? Tell your care team if your symptoms do not start to get better or if they get worse. This medication may cause serious skin reactions. They can happen weeks to months after starting the medication. Contact your care team right away if you notice fevers or flu-like symptoms with a rash. The rash may be red or purple and then turn into blisters or peeling of the skin. Or, you might notice a red rash with swelling of the face, lips or lymph nodes in your neck or under your arms. Do not treat diarrhea with over the counter products. Contact your care team if you have diarrhea that lasts more than 2 days or if it is severe and watery. If you have diabetes, you may get a false-positive result for sugar in your urine. Check with your care team. Birth control may not work properly while you are taking this medication. Talk to your care team about using an extra method of birth control. What side effects may I notice from receiving this medication? Side effects that you should report to your care team as soon as possible: Allergic reactions--skin rash, itching, hives, swelling of the face, lips, tongue, or throat Liver injury--right upper belly pain, loss of appetite, nausea, light-colored stool, dark yellow or brown urine, yellowing skin or eyes, unusual weakness or fatigue Redness, blistering, peeling, or loosening of the skin, including inside the mouth Severe diarrhea, fever Unusual vaginal discharge, itching, or odor Side effects that usually do not require medical attention (report to your care team if they continue or are bothersome): Diarrhea Nausea Vomiting This list may not describe all possible side effects. Call your doctor  for medical advice about side effects. You may report side effects to FDA at 1-800-FDA-1088. Where should I keep my medication? Keep out of the reach of children and pets. Store at room temperature between 20 and 25 degrees C (68 and 77 degrees F). Throw away any unused medication after the expiration date. NOTE: This sheet is a summary. It may not cover all possible information. If you have questions about this medicine, talk to your doctor, pharmacist, or health care provider.  2022 Elsevier/Gold Standard (2020-08-17 00:00:00) Sinusitis, Adult Sinusitis is inflammation of your sinuses. Sinuses are hollow spaces in the bones around your face. Your sinuses are located: Around your eyes. In the middle of your forehead. Behind your nose. In your cheekbones. Mucus normally drains out of your sinuses. When your nasal tissues become inflamed or swollen, mucus can become trapped or blocked. This allows bacteria, viruses, and fungi to grow, which leads to infection. Most infections of the sinuses are caused by a virus. Sinusitis can develop quickly. It can last for up to 4 weeks (acute) or for more than  12 weeks (chronic). Sinusitis often develops after a cold. What are the causes? This condition is caused by anything that creates swelling in the sinuses or stops mucus from draining. This includes: Allergies. Asthma. Infection from bacteria or viruses. Deformities or blockages in your nose or sinuses. Abnormal growths in the nose (nasal polyps). Pollutants, such as chemicals or irritants in the air. Infection from fungi (rare). What increases the risk? You are more likely to develop this condition if you: Have a weak body defense system (immune system). Do a lot of swimming or diving. Overuse nasal sprays. Smoke. What are the signs or symptoms? The main symptoms of this condition are pain and a feeling of pressure around the affected sinuses. Other symptoms include: Stuffy nose or  congestion. Thick drainage from your nose. Swelling and warmth over the affected sinuses. Headache. Upper toothache. A cough that may get worse at night. Extra mucus that collects in the throat or the back of the nose (postnasal drip). Decreased sense of smell and taste. Fatigue. A fever. Sore throat. Bad breath. How is this diagnosed? This condition is diagnosed based on: Your symptoms. Your medical history. A physical exam. Tests to find out if your condition is acute or chronic. This may include: Checking your nose for nasal polyps. Viewing your sinuses using a device that has a light (endoscope). Testing for allergies or bacteria. Imaging tests, such as an MRI or CT scan. In rare cases, a bone biopsy may be done to rule out more serious types of fungal sinus disease. How is this treated? Treatment for sinusitis depends on the cause and whether your condition is chronic or acute. If caused by a virus, your symptoms should go away on their own within 10 days. You may be given medicines to relieve symptoms. They include: Medicines that shrink swollen nasal passages (topical intranasal decongestants). Medicines that treat allergies (antihistamines). A spray that eases inflammation of the nostrils (topical intranasal corticosteroids). Rinses that help get rid of thick mucus in your nose (nasal saline washes). If caused by bacteria, your health care provider may recommend waiting to see if your symptoms improve. Most bacterial infections will get better without antibiotic medicine. You may be given antibiotics if you have: A severe infection. A weak immune system. If caused by narrow nasal passages or nasal polyps, you may need to have surgery. Follow these instructions at home: Medicines Take, use, or apply over-the-counter and prescription medicines only as told by your health care provider. These may include nasal sprays. If you were prescribed an antibiotic medicine, take it as  told by your health care provider. Do not stop taking the antibiotic even if you start to feel better. Hydrate and humidify  Drink enough fluid to keep your urine pale yellow. Staying hydrated will help to thin your mucus. Use a cool mist humidifier to keep the humidity level in your home above 50%. Inhale steam for 10-15 minutes, 3-4 times a day, or as told by your health care provider. You can do this in the bathroom while a hot shower is running. Limit your exposure to cool or dry air. Rest Rest as much as possible. Sleep with your head raised (elevated). Make sure you get enough sleep each night. General instructions  Apply a warm, moist washcloth to your face 3-4 times a day or as told by your health care provider. This will help with discomfort. Wash your hands often with soap and water to reduce your exposure to germs. If soap and  water are not available, use hand sanitizer. Do not smoke. Avoid being around people who are smoking (secondhand smoke). Keep all follow-up visits as told by your health care provider. This is important. Contact a health care provider if: You have a fever. Your symptoms get worse. Your symptoms do not improve within 10 days. Get help right away if: You have a severe headache. You have persistent vomiting. You have severe pain or swelling around your face or eyes. You have vision problems. You develop confusion. Your neck is stiff. You have trouble breathing. Summary Sinusitis is soreness and inflammation of your sinuses. Sinuses are hollow spaces in the bones around your face. This condition is caused by nasal tissues that become inflamed or swollen. The swelling traps or blocks the flow of mucus. This allows bacteria, viruses, and fungi to grow, which leads to infection. If you were prescribed an antibiotic medicine, take it as told by your health care provider. Do not stop taking the antibiotic even if you start to feel better. Keep all follow-up  visits as told by your health care provider. This is important. This information is not intended to replace advice given to you by your health care provider. Make sure you discuss any questions you have with your health care provider. Document Revised: 01/23/2018 Document Reviewed: 01/23/2018 Elsevier Patient Education  [place]. Cough, Adult A cough helps to clear your throat and lungs. A cough may be a sign of an illness or another medical condition. An acute cough may only last 2-3 weeks, while a chronic cough may last 8 or more weeks. Many things can cause a cough. They include: Germs (viruses or bacteria) that attack the airway. Breathing in things that bother (irritate) your lungs. Allergies. Asthma. Mucus that runs down the back of your throat (postnasal drip). Smoking. Acid backing up from the stomach into the tube that moves food from the mouth to the stomach (gastroesophageal reflux). Some medicines. Lung problems. Other medical conditions, such as heart failure or a blood clot in the lung (pulmonary embolism). Follow these instructions at home: Medicines Take over-the-counter and prescription medicines only as told by your doctor. Talk with your doctor before you take medicines that stop a cough (cough suppressants). Lifestyle  Do not smoke, and try not to be around smoke. Do not use any products that contain nicotine or tobacco, such as cigarettes, e-cigarettes, and chewing tobacco. If you need help quitting, ask your doctor. Drink enough fluid to keep your pee (urine) pale yellow. Avoid caffeine. Do not drink alcohol if your doctor tells you not to drink. General instructions  Watch for any changes in your cough. Tell your doctor about them. Always cover your mouth when you cough. Stay away from things that make you cough, such as perfume, candles, campfire smoke, or cleaning products. If the air is dry, use a cool mist vaporizer or humidifier in your home. If  your cough is worse at night, try using extra pillows to raise your head up higher while you sleep. Rest as needed. Keep all follow-up visits as told by your doctor. This is important. Contact a doctor if: You have new symptoms. You cough up pus. Your cough does not get better after 2-3 weeks, or your cough gets worse. Cough medicine does not help your cough and you are not sleeping well. You have pain that gets worse or pain that is not helped with medicine. You have a fever. You are losing weight and you do not  know why. You have night sweats. Get help right away if: You cough up blood. You have trouble breathing. Your heartbeat is very fast. These symptoms may be an emergency. Do not wait to see if the symptoms will go away. Get medical help right away. Call your local emergency services (911 in the [place] Do not drive yourself to the hospital. Summary A cough helps to clear your throat and lungs. Many things can cause a cough. Take over-the-counter and prescription medicines only as told by your doctor. Always cover your mouth when you cough. Contact a doctor if you have new symptoms or you have a cough that does not get better or gets worse. This information is not intended to replace advice given to you by your health care provider. Make sure you discuss any questions you have with your health care provider. Document Revised: 10/12/2019 Document Reviewed: 09/11/2018 Elsevier Patient Education  [place].

## 2021-07-28 NOTE — Progress Notes (Signed)
Cbc within normal limits.

## 2021-07-28 NOTE — Progress Notes (Signed)
Chest x ray within normal limits.

## 2021-07-28 NOTE — Progress Notes (Signed)
Negative flu a and b

## 2021-07-30 ENCOUNTER — Other Ambulatory Visit: Payer: Self-pay | Admitting: Family Medicine

## 2021-07-30 DIAGNOSIS — E0590 Thyrotoxicosis, unspecified without thyrotoxic crisis or storm: Secondary | ICD-10-CM

## 2021-08-16 ENCOUNTER — Other Ambulatory Visit: Payer: Self-pay | Admitting: Internal Medicine

## 2021-08-16 DIAGNOSIS — I10 Essential (primary) hypertension: Secondary | ICD-10-CM

## 2021-08-18 ENCOUNTER — Other Ambulatory Visit: Payer: Self-pay | Admitting: Internal Medicine

## 2021-08-20 ENCOUNTER — Telehealth: Payer: Self-pay | Admitting: Internal Medicine

## 2021-08-20 NOTE — Telephone Encounter (Signed)
Pt called in stating that NP Flinchum advise Pt if her voice doesn't get any better to call the office again. Pt Called in stating that her voice is still the same. Pt stated that her voice been like this for 3 weeks. Pt requesting callback

## 2021-08-20 NOTE — Telephone Encounter (Signed)
Please schedule her for follow up.

## 2021-08-20 NOTE — Telephone Encounter (Signed)
Pt stated that she was advised to call the office back if the hoarseness in her voice had not gotten better. Pt stated that her voice has not changed since speaking with Sharyn Lull, NP on 07/28/2021.

## 2021-08-21 NOTE — Telephone Encounter (Signed)
Spoke with pt and scheduled her a follow up appt.  

## 2021-08-25 ENCOUNTER — Other Ambulatory Visit: Payer: Self-pay

## 2021-08-25 ENCOUNTER — Ambulatory Visit: Payer: BLUE CROSS/BLUE SHIELD | Admitting: Adult Health

## 2021-08-25 ENCOUNTER — Encounter: Payer: Self-pay | Admitting: Adult Health

## 2021-08-25 VITALS — BP 130/88 | HR 69 | Temp 97.9°F | Ht 65.0 in | Wt 194.0 lb

## 2021-08-25 DIAGNOSIS — R490 Dysphonia: Secondary | ICD-10-CM | POA: Diagnosis not present

## 2021-08-25 DIAGNOSIS — R0982 Postnasal drip: Secondary | ICD-10-CM

## 2021-08-25 DIAGNOSIS — R21 Rash and other nonspecific skin eruption: Secondary | ICD-10-CM | POA: Diagnosis not present

## 2021-08-25 DIAGNOSIS — Z1231 Encounter for screening mammogram for malignant neoplasm of breast: Secondary | ICD-10-CM

## 2021-08-25 DIAGNOSIS — E892 Postprocedural hypoparathyroidism: Secondary | ICD-10-CM | POA: Diagnosis not present

## 2021-08-25 DIAGNOSIS — Z87898 Personal history of other specified conditions: Secondary | ICD-10-CM

## 2021-08-25 LAB — TSH: TSH: 3.22 u[IU]/mL (ref 0.35–5.50)

## 2021-08-25 LAB — COMPREHENSIVE METABOLIC PANEL
ALT: 17 U/L (ref 0–35)
AST: 17 U/L (ref 0–37)
Albumin: 3.9 g/dL (ref 3.5–5.2)
Alkaline Phosphatase: 70 U/L (ref 39–117)
BUN: 9 mg/dL (ref 6–23)
CO2: 26 mEq/L (ref 19–32)
Calcium: 9.6 mg/dL (ref 8.4–10.5)
Chloride: 102 mEq/L (ref 96–112)
Creatinine, Ser: 1.01 mg/dL (ref 0.40–1.20)
GFR: 60.13 mL/min (ref >60.00)
Glucose, Bld: 69 mg/dL — ABNORMAL LOW (ref 70–99)
Potassium: 4.3 mEq/L (ref 3.5–5.1)
Sodium: 135 mEq/L (ref 135–145)
Total Bilirubin: 0.4 mg/dL (ref 0.2–1.2)
Total Protein: 7.1 g/dL (ref 6.0–8.3)

## 2021-08-25 LAB — CBC WITH DIFFERENTIAL/PLATELET
Basophils Absolute: 0.1 10*3/uL (ref 0.0–0.1)
Basophils Relative: 0.7 % (ref 0.0–3.0)
Eosinophils Absolute: 0.1 10*3/uL (ref 0.0–0.7)
Eosinophils Relative: 1.6 % (ref 0.0–5.0)
HCT: 40.1 % (ref 36.0–46.0)
Hemoglobin: 12.9 g/dL (ref 12.0–15.0)
Lymphocytes Relative: 44.2 % (ref 12.0–46.0)
Lymphs Abs: 3 10*3/uL (ref 0.7–4.0)
MCHC: 32.2 g/dL (ref 30.0–36.0)
MCV: 79.3 fl (ref 78.0–100.0)
Monocytes Absolute: 0.7 10*3/uL (ref 0.1–1.0)
Monocytes Relative: 10.6 % (ref 3.0–12.0)
Neutro Abs: 2.9 10*3/uL (ref 1.4–7.7)
Neutrophils Relative %: 42.9 % — ABNORMAL LOW (ref 43.0–77.0)
Platelets: 283 10*3/uL (ref 150.0–400.0)
RBC: 5.06 Mil/uL (ref 3.87–5.11)
RDW: 14.8 % (ref 11.5–15.5)
WBC: 6.7 10*3/uL (ref 4.0–10.5)

## 2021-08-25 MED ORDER — FLUTICASONE PROPIONATE 50 MCG/ACT NA SUSP
1.0000 | Freq: Two times a day (BID) | NASAL | 1 refills | Status: DC | PRN
Start: 2021-08-25 — End: 2023-02-28

## 2021-08-25 NOTE — Progress Notes (Signed)
CMP glucose is mildly low, just monitor for hypoglycemia, and eat healthy meals during day. TSH is within normal limits. CBC no signs of blood infection.

## 2021-08-25 NOTE — Progress Notes (Signed)
Acute Office Visit  Subjective:    Patient ID: Elizabeth [name], female    DOB: 10/12/1960, 60 y.o.   MRN: [id number]  Chief Complaint  Patient presents with   Follow-up    HPI Patient is in today for follow up on persistent hoarseness. She was treated for sinusitis with prednisone, Augmentin and given benzonatate for cough as well as advised to start Zyrtec. Not using Flonase. No glaucoma history.   She is still continuing, Zyrtec. She does feel she improved with Augmentin however she is still having hoarseness. Cough has resolved. Denies any wheezing. Denies any chest congestion.    Sees Dr. Lonzo Cloud endocrinology. History of parathyroid nodule, removed. Was told it was on her vocal cord, was told not cancerous. 01/05/2021  She had shoulder surgery right  of 01/16/21   Patient  denies any fever, body aches,chills, chest pain, shortness of breath, nausea, vomiting, or diarrhea.  Denies dizziness, lightheadedness, pre syncopal or syncopal episodes.    Past Medical History:  Diagnosis Date   Anxiety    Arthritis    Complication of anesthesia    Slow to wake up   Dental bridge present    permanent upper   GERD (gastroesophageal reflux disease)    Gout    Hypertension    Hyperthyroidism    Thyroid disease    Vertigo     Past Surgical History:  Procedure Laterality Date   ENDOMETRIAL ABLATION  2010   PARATHYROIDECTOMY Right 01/05/2021   Procedure: RIGHT INFERIOR PARATHYROIDECTOMY;  Surgeon: Darnell Level, MD;  Location: [facility];  Service: General;  Laterality: Right;   SHOULDER ARTHROSCOPY WITH ROTATOR CUFF REPAIR AND SUBACROMIAL DECOMPRESSION Right 01/16/2021   Procedure: Right shoulder arthroscopic subscapularis repair, rotator cuff repair, subacromial decompression, and biceps tenodesis - Dedra Skeens to Assist;  Surgeon: Signa Kell, MD;  Location: [facility];  Service: Orthopedics;  Laterality: Right;   WRIST SURGERY Right     Family History  Problem Relation  Age of Onset   Hypertension Mother    Heart disease Mother    Heart failure Mother    Hypertension Father     Social History   Socioeconomic History   Marital status: Married    Spouse name: Not on file   Number of children: Not on file   Years of education: Not on file   Highest education level: Not on file  Occupational History   Not on file  Tobacco Use   Smoking status: Former    Types: Cigarettes    Quit date: 01/12/1999    Years since quitting: 22.6   Smokeless tobacco: Never  Vaping Use   Vaping Use: Never used  Substance and Sexual Activity   Alcohol use: Yes    Comment: occ   Drug use: Never   Sexual activity: Not on file  Other Topics Concern   Not on file  Social History Narrative   Lives with husband [name] (also a pt Geographical information systems officer)   Right handed   Social Determinants of Health   Financial Resource Strain: Not on file  Food Insecurity: Not on file  Transportation Needs: Not on file  Physical Activity: Not on file  Stress: Not on file  Social Connections: Not on file  Intimate Partner Violence: Not on file    Outpatient Medications Prior to Visit  Medication Sig Dispense Refill   acetaminophen (TYLENOL) 500 MG tablet Take 2 tablets (1,000 mg total) by mouth every 8 (eight) hours. 90 tablet 2  amoxicillin-clavulanate (AUGMENTIN) 875-125 MG tablet Take 1 tablet by mouth 2 (two) times daily. 20 tablet 0   benzonatate (TESSALON) 100 MG capsule Take 1 capsule (100 mg total) by mouth 2 (two) times daily as needed for cough. 20 capsule 0   cetirizine (ZYRTEC) 10 MG tablet Take 1 tablet (10 mg total) by mouth daily. 30 tablet 11   cyclobenzaprine (FLEXERIL) 5 MG tablet Take 1 tablet (5 mg total) by mouth 3 (three) times daily as needed for muscle spasms. 90 tablet 2   gabapentin (NEURONTIN) 300 MG capsule Take 1 capsule (300 mg total) by mouth daily as needed (neurpoathy). 30 capsule 2   losartan (COZAAR) 100 MG tablet Take 1 tablet (100 mg  total) by mouth daily. 90 tablet 3   propranolol (INDERAL) 20 MG tablet TAKE 1 TABLET(20 MG) BY MOUTH THREE TIMES DAILY 270 tablet 1   propylthiouracil (PTU) 50 MG tablet TAKE 1 TABLET BY MOUTH 3 TIMES A DAY 270 tablet 0   sertraline (ZOLOFT) 50 MG tablet Take 1 tablet (50 mg total) by mouth daily after breakfast. 90 tablet 1   spironolactone (ALDACTONE) 50 MG tablet TAKE 1 TABLET BY MOUTH EVERY DAY 90 tablet 1   triamcinolone cream (KENALOG) 0.1 % Apply 1 application topically 2 (two) times daily. 30 g 0   FLUTICASONE PROPIONATE NA Place 1-2 sprays into the nose 2 (two) times daily as needed (allergies).     No facility-administered medications prior to visit.    Allergies  Allergen Reactions   Codeine     Groggy   Metoprolol     SOB   Tramadol Other (See Comments)    Made her a zombie     Review of Systems  Constitutional: Negative.   HENT:  Positive for postnasal drip, rhinorrhea and voice change. Negative for congestion, dental problem, drooling, ear discharge, ear pain, facial swelling, hearing loss, mouth sores, nosebleeds, sinus pressure, sinus pain, sneezing, sore throat, tinnitus and trouble swallowing.   Respiratory: Negative.  Negative for apnea, cough, choking, chest tightness, shortness of breath, wheezing and stridor.   Cardiovascular: Negative.  Negative for chest pain, palpitations and leg swelling.      Objective:    Physical Exam Vitals reviewed.  Constitutional:      General: She is not in acute distress.    Appearance: She is well-developed. She is not diaphoretic.     Interventions: She is not intubated. HENT:     Head: Normocephalic and atraumatic.     Jaw: There is normal jaw occlusion.     Right Ear: Hearing, ear canal and external ear normal. No middle ear effusion. Tympanic membrane is not perforated or erythematous.     Left Ear: Hearing, ear canal and external ear normal.  No middle ear effusion. Tympanic membrane is not perforated or erythematous.      Nose: Nose normal.     Mouth/Throat:     Pharynx: Oropharynx is clear. Uvula midline. No pharyngeal swelling, oropharyngeal exudate, posterior oropharyngeal erythema or uvula swelling.  Eyes:     General: Lids are normal. No scleral icterus.       Right eye: No discharge.        Left eye: No discharge.     Conjunctiva/sclera: Conjunctivae normal.     Right eye: Right conjunctiva is not injected. No exudate or hemorrhage.    Left eye: Left conjunctiva is not injected. No exudate or hemorrhage.    Pupils: Pupils are equal, round, and reactive to  light.  Neck:     Thyroid: No thyroid mass.     Vascular: Normal carotid pulses. No carotid bruit, hepatojugular reflux or JVD.     Trachea: Trachea and phonation normal. No tracheal tenderness or tracheal deviation.     Meningeal: Brudzinski's sign and Kernig's sign absent.     Comments: Hoarse voice persists no change since last visit.  Cardiovascular:     Rate and Rhythm: Normal rate and regular rhythm.     Pulses: Normal pulses.          Radial pulses are 2+ on the right side and 2+ on the left side.       Dorsalis pedis pulses are 2+ on the right side and 2+ on the left side.       Posterior tibial pulses are 2+ on the right side and 2+ on the left side.     Heart sounds: Normal heart sounds, S1 normal and S2 normal. Heart sounds not distant. No murmur heard.   No friction rub. No gallop.  Pulmonary:     Effort: Pulmonary effort is normal. No tachypnea, bradypnea, accessory muscle usage or respiratory distress. She is not intubated.     Breath sounds: Normal breath sounds. No stridor. No wheezing, rhonchi or rales.  Chest:     Chest wall: No tenderness.       Comments: Declined breast exam at this time  due to right shoulder surgery recovery.   2cm area of hyperpigmented skin, dry Patient denies any breast changes, nipple  drainage or discharge.  Abdominal:     General: Bowel sounds are normal. There is no distension or abdominal  bruit.     Palpations: Abdomen is soft. There is no shifting dullness, fluid wave, hepatomegaly, splenomegaly, mass or pulsatile mass.     Tenderness: There is no abdominal tenderness. There is no guarding or rebound.     Hernia: No hernia is present.  Musculoskeletal:        General: No tenderness or deformity. Normal range of motion.     Cervical back: Full passive range of motion without pain, normal range of motion and neck supple. No edema, erythema or rigidity. No spinous process tenderness or muscular tenderness. Normal range of motion.  Lymphadenopathy:     Head:     Right side of head: No submental, submandibular, tonsillar, preauricular, posterior auricular or occipital adenopathy.     Left side of head: No submental, submandibular, tonsillar, preauricular, posterior auricular or occipital adenopathy.     Cervical: No cervical adenopathy.     Right cervical: No superficial, deep or posterior cervical adenopathy.    Left cervical: No superficial, deep or posterior cervical adenopathy.     Upper Body:     Right upper body: No supraclavicular or pectoral adenopathy.     Left upper body: No supraclavicular or pectoral adenopathy.  Skin:    General: Skin is warm and dry.     Coloration: Skin is not pale.     Findings: No abrasion, bruising, burn, ecchymosis, erythema, lesion, petechiae or rash.     Nails: There is no clubbing.  Neurological:     Mental Status: She is alert and oriented to person, place, and time.     GCS: GCS eye subscore is 4. GCS verbal subscore is 5. GCS motor subscore is 6.     Cranial Nerves: No cranial nerve deficit.     Sensory: No sensory deficit.     Motor: No tremor, atrophy,  abnormal muscle tone or seizure activity.     Coordination: Coordination normal.     Gait: Gait normal.     Deep Tendon Reflexes: Reflexes are normal and symmetric. Reflexes normal. Babinski sign absent on the right side. Babinski sign absent on the left side.     Reflex Scores:       Tricep reflexes are 2+ on the right side and 2+ on the left side.      Bicep reflexes are 2+ on the right side and 2+ on the left side.      Brachioradialis reflexes are 2+ on the right side and 2+ on the left side.      Patellar reflexes are 2+ on the right side and 2+ on the left side.      Achilles reflexes are 2+ on the right side and 2+ on the left side. Psychiatric:        Speech: Speech normal.        Behavior: Behavior normal.        Thought Content: Thought content normal.        Judgment: Judgment normal.    BP 130/88    Pulse 69    Temp 97.9 F (36.6 C)    Ht $R'5\' 5"'er$  (1.651 m)    Wt 194 lb (88 kg)    SpO2 96%    BMI 32.28 kg/m  Wt Readings from Last 3 Encounters:  08/25/21 194 lb (88 kg)  07/28/21 188 lb 12.8 oz (85.6 kg)  06/17/21 188 lb (85.3 kg)    There are no preventive care reminders to display for this patient.  There are no preventive care reminders to display for this patient.   Lab Results  Component Value Date   TSH 3.22 08/25/2021   Lab Results  Component Value Date   WBC 6.7 08/25/2021   HGB 12.9 08/25/2021   HCT 40.1 08/25/2021   MCV 79.3 08/25/2021   PLT 283.0 08/25/2021   Lab Results  Component Value Date   NA 135 08/25/2021   K 4.3 08/25/2021   CO2 26 08/25/2021   GLUCOSE 69 (L) 08/25/2021   BUN 9 08/25/2021   CREATININE 1.01 08/25/2021   BILITOT 0.4 08/25/2021   ALKPHOS 70 08/25/2021   AST 17 08/25/2021   ALT 17 08/25/2021   PROT 7.1 08/25/2021   ALBUMIN 3.9 08/25/2021   CALCIUM 9.6 08/25/2021   ANIONGAP 7 01/01/2021   EGFR 62 02/09/2021   GFR 60.13 08/25/2021   Lab Results  Component Value Date   CHOL 238 (H) 02/27/2019   Lab Results  Component Value Date   HDL 54.90 02/27/2019   Lab Results  Component Value Date   LDLCALC 158 (H) 02/27/2019   Lab Results  Component Value Date   TRIG 125.0 02/27/2019   Lab Results  Component Value Date   CHOLHDL 4 02/27/2019   Lab Results  Component Value Date   HGBA1C 5.7  08/23/2019       Assessment & Plan:   Problem List Items Addressed This Visit       Other   S/P removal of parathyroid gland ([place])   Relevant Orders   Ambulatory referral to ENT   CBC with Differential/Platelet (Completed)   Comprehensive metabolic panel (Completed)   TSH (Completed)   Other Visit Diagnoses     Hoarseness    -  Primary   Relevant Orders   Ambulatory referral to ENT   Culture, Group A Strep (Completed)  Post-nasal drip       Relevant Medications   fluticasone (FLONASE) 50 MCG/ACT nasal spray   Skin rash - left breast chronic x 2 months        Relevant Orders   Ambulatory referral to Dermatology   Screening mammogram for breast cancer       Relevant Orders   MM Digital Diagnostic Bilat   History of abnormal mammogram       Relevant Orders   MM Digital Diagnostic Bilat      1. Hoarseness Strep test is negative today in the office, given her persistent hoarseness and history of parathyroid gland removal, she will be referred to ENT for follow-up this was placed as an urgent referral.  She has no difficulty swallowing or painful swallowing.  But has had persistent hoarseness. - Ambulatory referral to ENT she was told that she should hear from this referral within 2 weeks if she is not she should contact the office. - Culture, Group A Strep  2. S/P removal of parathyroid gland ([place]) Advised her to call her endocrinologist for follow up as well within 1- 2 weeks.  - Ambulatory referral to ENT - CBC with Differential/Platelet - Comprehensive metabolic panel - TSH  3. Post-nasal drip Continue Zyrtec and Flonase nasal spray restart, she has no refills of Flonase have sent.  - fluticasone (FLONASE) 50 MCG/ACT nasal spray; Place 1 spray into both nostrils 2 (two) times daily as needed for rhinitis or allergies (allergies).  Dispense: 16 mL; Refill: 1  4. Skin rash - left breast chronic x 2 months  She reports she tried cream PCP gave her and has seen no  improvement in rash and request dermatology.  Denies any breast concerns, she is due for repeat diagnostic mammogram since August, this was ordered, she will call breast center at [facility] to schedule, it was delayed due to her shoulder surgery. She is advised she should have this done ASAP.  - Ambulatory referral to Dermatology  5. Screening mammogram for breast cancer Recommended her have a recheck diagnostic mammogram as recommended by radiologist at her last she is overdue. - MM Digital Diagnostic Bilat  6. History of abnormal mammogram Declined breast exam today she has noticed no skin dimpling, breast changes, change in breast size or lumps with self breast exam. - MM Digital Diagnostic Bilat   Meds ordered this encounter  Medications   fluticasone (FLONASE) 50 MCG/ACT nasal spray    Sig: Place 1 spray into both nostrils 2 (two) times daily as needed for rhinitis or allergies (allergies).    Dispense:  16 mL    Refill:  1  Red Flags discussed. The patient was given clear instructions to go to ER or return to medical center if any red flags develop, symptoms do not improve, worsen or new problems develop. They verbalized understanding.  Return in about 3 weeks (around 09/15/2021), or if symptoms worsen or fail to improve, for Go to Emergency room/ urgent care if worse, at any time for any worsening symptoms.  Red Flags discussed. The patient was given clear instructions to go to ER or return to medical center if any red flags develop, symptoms do not improve, worsen or new problems develop. They verbalized understanding.  Marcille Buffy, FNP

## 2021-08-25 NOTE — Patient Instructions (Addendum)
Red Flags discussed. The patient was given clear instructions to go to ER or return to medical center if any red flags develop, symptoms do not improve, worsen or new problems develop. They verbalized understanding. Should hear from ENT and Dermatology referral in 2 weeks if not let me know.  Recommend going ahead and scheduling mammogram now that you have mobility back in shoulder since your right shoulder surgery. Follow up with your endocrinologist.  Return in about 3 weeks (around 09/15/2021), or if symptoms worsen or fail to improve, for Go to Emergency room/ urgent care if worse, at any time for any worsening symptoms.  Hoarseness Hoarseness, also called dysphonia, is any abnormal change in your voice that can make it difficult to speak. Your voice may sound raspy, breathy, or strained. Hoarseness is caused by a problem with your vocal cords (vocal folds). These are two bands of tissue inside your voice box (larynx). When you speak, your vocal cords move back and forth to create sound. The surfaces of your vocal cords need to be smooth for your voice to sound clear. Swelling or lumps on your vocal cords can cause hoarseness. Vocal cord problems may be the result of injuries or abnormal growths, certain diseases, upper respiratory infection, or allergies. Other causes may include medicine side effects and exposure to irritants. Follow these instructions at home: Pay attention to any changes in your symptoms. Take these actions to stay safe and to help relieve your symptoms: Lifestyle Do not eat foods that give you heartburn, such as spicy or acidic foods like hot peppers and orange juice. These foods can cause a gastroesophageal reflux that may worsen your vocal cord problems. Limit how much alcohol and caffeine you drink as told by your health care provider. Drink enough fluid to keep your urine pale yellow. Do not use any products that contain nicotine or tobacco. These products include  cigarettes, chewing tobacco, and vaping devices, such as e-cigarettes. If you need help quitting, ask your health care provider. Avoid secondhand smoke. General instructions Use a humidifier if the air in your home is dry. Avoid coughing or clearing your throat. Do not whisper. Whispering can cause muscle strain. Do not speak in a loud or harsh voice. Rest your voice. If recommended by your health care provider, schedule an appointment with a speech-language specialist. This specialist may give you methods to try that can help you avoid misusing your voice. Contact a health care provider if: Your voice is hoarse longer than 2 weeks. You almost lose or completely lose your voice for more than 3 days. You have pain when you swallow or try to talk. You feel a lump in your neck. Get help right away if: You have trouble swallowing. You feel like you are choking when you swallow. You cough up blood or vomit blood. You have trouble breathing. You choke, cannot swallow, or cannot breathe if you lie flat. You notice swelling or a rash on your body, face, or tongue. These symptoms may represent a serious problem that is an emergency. Do not wait to see if the symptoms will go away. Get medical help right away. Call your local emergency services (911 in the [place] Do not drive yourself to the hospital. Summary Hoarseness, also called dysphonia, is any abnormal change in your voice that can make it difficult to speak. Your voice may sound raspy, breathy, or strained. Hoarseness is caused by a problem with your vocal cords (vocal folds). Do not speak in  a loud or harsh voice, whisper, use nicotine or tobacco products, or eat foods that give you heartburn. See your health care provider if your hoarseness does not improve after 2 weeks. This information is not intended to replace advice given to you by your health care provider. Make sure you discuss any questions you have with your health care  provider. Document Revised: 02/10/2021 Document Reviewed: 02/10/2021 Elsevier Patient Education  [place].

## 2021-08-27 ENCOUNTER — Ambulatory Visit: Payer: BLUE CROSS/BLUE SHIELD | Admitting: Adult Health

## 2021-08-27 LAB — CULTURE, GROUP A STREP
MICRO NUMBER:: 12780102
SPECIMEN QUALITY:: ADEQUATE

## 2021-08-27 NOTE — Progress Notes (Signed)
Strep negative in throat.

## 2021-09-10 ENCOUNTER — Telehealth: Payer: Self-pay | Admitting: Internal Medicine

## 2021-09-10 NOTE — Telephone Encounter (Signed)
Ambulatory referral to DermatologyOrdered 08/25/2021 External Referral, Urgent, To - [facility], Dermatology, Specialty Services Required   Ordered on: 08/25/2021  Associated Dx: Skin rash  Authorizing provider: Doreen Beam, FNP   In office note 08/25/21 she is female and is in [place], the wait list for [facility] is very long does she want me to change it  ?

## 2021-09-10 NOTE — Telephone Encounter (Signed)
Rejection Reason - Patient Declined - Pt wanted to be seen closer to home" Elizabeth [name] said on Sep 10, 2021 11:46 AM  Msg from Babs Bertin  I will sent referral to [facility] in [facility]

## 2021-09-10 NOTE — Telephone Encounter (Signed)
Patient was referred to dermatology by Laverna Peace. Patient is requesting the referral be in [place] and a women provider.

## 2021-09-14 ENCOUNTER — Ambulatory Visit: Payer: BLUE CROSS/BLUE SHIELD | Admitting: Dermatology

## 2021-09-14 ENCOUNTER — Other Ambulatory Visit: Payer: Self-pay

## 2021-09-14 DIAGNOSIS — R21 Rash and other nonspecific skin eruption: Secondary | ICD-10-CM

## 2021-09-14 DIAGNOSIS — L309 Dermatitis, unspecified: Secondary | ICD-10-CM | POA: Diagnosis not present

## 2021-09-14 NOTE — Progress Notes (Signed)
° °  New Patient Visit   Subjective  Elizabeth [name] is a 61 y.o. female who presents for the following: Rash (Started on left breast and is now on the right breast and is starting to itch. Tmc 0.1% cream made it red/).  The following portions of the chart were reviewed this encounter and updated as appropriate:   Tobacco   Allergies   Meds   Problems   Med Hx   Surg Hx   Fam Hx      Review of Systems:  No other skin or systemic complaints except as noted in HPI or Assessment and Plan.  Objective  Well appearing patient in no apparent distress; mood and affect are within normal limits.  A focused examination was performed including breasts. Relevant physical exam findings are noted in the Assessment and Plan.  Left Breast Patchy erythema with scaliness         Assessment & Plan  Rash Left Breast  Skin / nail biopsy - Left Breast Type of biopsy: punch   Informed consent: discussed and consent obtained   Patient was prepped and draped in usual sterile fashion: Area prepped with alcohol. Anesthesia: the lesion was anesthetized in a standard fashion   Anesthetic:  1% lidocaine w/ epinephrine 1-100,000 buffered w/ 8.4% NaHCO3 Suture type: nylon   Suture removal (days):  7 Hemostasis achieved with: suture and pressure   Outcome: patient tolerated procedure well   Post-procedure details: wound care instructions given   Post-procedure details comment:  Ointment and small bandage applied  Specimen 1 - Surgical pathology Differential Diagnosis: Eczema vs Padget's Disease vs Breast Ca Check Margins: No   Return in about 1 week (around 09/21/2021) for Biopsy Follow up.  I, Ashok Cordia, CMA, am acting as scribe for Elizabeth Ser, MD . Documentation: I have reviewed the above documentation for accuracy and completeness, and I agree with the above.  Elizabeth Ser, MD

## 2021-09-14 NOTE — Patient Instructions (Signed)

## 2021-09-15 ENCOUNTER — Encounter: Payer: Self-pay | Admitting: Dermatology

## 2021-09-21 ENCOUNTER — Ambulatory Visit: Payer: BLUE CROSS/BLUE SHIELD | Admitting: Dermatology

## 2021-09-21 ENCOUNTER — Other Ambulatory Visit: Payer: Self-pay

## 2021-09-21 DIAGNOSIS — L309 Dermatitis, unspecified: Secondary | ICD-10-CM

## 2021-09-21 MED ORDER — OPZELURA 1.5 % EX CREA: 1.0000 "application " | TOPICAL_CREAM | Freq: Every day | CUTANEOUS | 1 refills | Status: DC

## 2021-09-21 NOTE — Patient Instructions (Signed)

## 2021-09-21 NOTE — Progress Notes (Signed)
° °  Follow-Up Visit   Subjective  Elizabeth [name] is a 61 y.o. female who presents for the following: Suture removal/discuss pathology results (Punch biopsy of the L areola - bx proven atopic dermatitis vs contact dermatitis. Patient here today for suture removal and to discuss treatment options. ).  The following portions of the chart were reviewed this encounter and updated as appropriate:   Tobacco   Allergies   Meds   Problems   Med Hx   Surg Hx   Fam Hx      Review of Systems:  No other skin or systemic complaints except as noted in HPI or Assessment and Plan.  Objective  Well appearing patient in no apparent distress; mood and affect are within normal limits.  A focused examination was performed including the face. Relevant physical exam findings are noted in the Assessment and Plan.  B/L breast Patchy erythema with scaliness.   Assessment & Plan  Dermatitis B/L breast Bx proven atopic dermatitis vs contact dermatitis -  Clinical pathological correlation most consistent with atopic dermatitis/eczema. Patient failed topical corticosteroids in the past given by her PCP Start Opzelura twice daily.    Encounter for Removal of Sutures - Incision site at the L areola is clean, dry and intact - Wound cleansed, sutures removed, wound cleansed and steri strips applied.  - Discussed pathology results showing atopic dermatitis vs contact dermatitis  - Patient advised to keep steri-strips dry until they fall off. - Scars remodel for a full year. - Once steri-strips fall off, patient can apply over-the-counter silicone scar cream each night to help with scar remodeling if desired. - Patient advised to call with any concerns or if they notice any new or changing lesions.  Start Opzelura cream to aa QD PRN.   Ruxolitinib Phosphate (OPZELURA) 1.5 % CREA - B/L breast Apply 1 application topically daily. Apply to rash QD until clear then PRN.  Return in about 6 weeks (around 11/02/2021)  for rash f/u .  Luther Redo, CMA, am acting as scribe for Sarina Ser, MD . Documentation: I have reviewed the above documentation for accuracy and completeness, and I agree with the above.  Sarina Ser, MD

## 2021-09-22 ENCOUNTER — Encounter: Payer: Self-pay | Admitting: Dermatology

## 2021-09-25 ENCOUNTER — Ambulatory Visit: Payer: BLUE CROSS/BLUE SHIELD | Admitting: Internal Medicine

## 2021-09-28 ENCOUNTER — Ambulatory Visit: Payer: BLUE CROSS/BLUE SHIELD | Admitting: Internal Medicine

## 2021-10-05 ENCOUNTER — Ambulatory Visit: Payer: BLUE CROSS/BLUE SHIELD | Admitting: Internal Medicine

## 2021-10-15 ENCOUNTER — Telehealth: Payer: Self-pay | Admitting: Internal Medicine

## 2021-10-15 DIAGNOSIS — J0100 Acute maxillary sinusitis, unspecified: Secondary | ICD-10-CM

## 2021-10-15 NOTE — Telephone Encounter (Signed)
"  Rejection Reason - Patient did not respond" Elizabeth [name] said on Oct 15, 2021 10:35 AM  Msg from [place] [facility]

## 2021-11-02 ENCOUNTER — Ambulatory Visit: Payer: BLUE CROSS/BLUE SHIELD | Admitting: Dermatology

## 2021-11-09 NOTE — Progress Notes (Signed)
Name: Elizabeth [name]  MRN/ DOB: [id number], [birth year]    Age/ Sex: 61 y.o., female     PCP: Crecencio Mc, MD   Reason for Endocrinology Evaluation: Hyperthyroidism/ Hypercalcemia      Initial Endocrinology Clinic Visit: 09/06/2019    PATIENT IDENTIFIER: Elizabeth [name] is a 61 y.o., female with a past medical history of HTN, Hyperthyroidism and Hypercalcemia . She has followed with [facility] since 09/06/2019 for consultative assistance with management of her Hypercalcemia and hyperthyroidism.      HISTORICAL SUMMARY: The patient was first diagnosed with hypercalcemia many years ago.  Ca/Cr ratio 0.013 in 11/2019 which is inconclusive.  DXA 09/25/2019 - normal results.  Status post right inferior parathyroidectomy 01/2021     THYROID HISTORY :  Has been diagnosed with hyperthyroidism in early 2000's. She has been on PTU since her diagnosis.    HTN: Renin was normal 0.464 ng/Ml/hr with normal aldosterone level at 9.1 ng/dL  In 07/2020. Prior to that she had inconclusive testing but confirmed normal by 07/2020 with normal serum potassium.   SUBJECTIVE:     Today (11/10/2021):  [name] is here for hyperthyroidism. The pt has NOT been to our clinic in 1 year.     Denies fever Denies constipation or diarrhea  Denies palpitations  Has noted local neck swelling  Has noted voice hoarseness for the past couple of months.    PTU 50 mg daily      HISTORY:  Past Medical History:  Past Medical History:  Diagnosis Date   Anxiety    Arthritis    Complication of anesthesia    Slow to wake up   Dental bridge present    permanent upper   GERD (gastroesophageal reflux disease)    Gout    Hypertension    Hyperthyroidism    Thyroid disease    Vertigo    Past Surgical History:  Past Surgical History:  Procedure Laterality Date   ENDOMETRIAL ABLATION  2010   PARATHYROIDECTOMY Right 01/05/2021   Procedure: RIGHT INFERIOR PARATHYROIDECTOMY;   Surgeon: Armandina Gemma, MD;  Location: [facility];  Service: General;  Laterality: Right;   SHOULDER ARTHROSCOPY WITH ROTATOR CUFF REPAIR AND SUBACROMIAL DECOMPRESSION Right 01/16/2021   Procedure: Right shoulder arthroscopic subscapularis repair, rotator cuff repair, subacromial decompression, and biceps tenodesis - Reche Dixon to Assist;  Surgeon: Leim Fabry, MD;  Location: [facility];  Service: Orthopedics;  Laterality: Right;   WRIST SURGERY Right    Social History:  reports that she quit smoking about 22 years ago. Her smoking use included cigarettes. She has never used smokeless tobacco. She reports current alcohol use. She reports that she does not use drugs. Family History:  Family History  Problem Relation Age of Onset   Hypertension Mother    Heart disease Mother    Heart failure Mother    Hypertension Father      HOME MEDICATIONS: Allergies as of 11/10/2021       Reactions   Codeine    Groggy   Metoprolol    SOB   Tramadol Other (See Comments)   Made her a zombie        Medication List        Accurate as of November 10, 2021  8:16 AM. If you have any questions, ask your nurse or doctor.          STOP taking these medications    amoxicillin-clavulanate 875-125 MG tablet Commonly known as: Augmentin Stopped  by: Dorita Sciara, MD   benzonatate 100 MG capsule Commonly known as: TESSALON Stopped by: Dorita Sciara, MD       TAKE these medications    acetaminophen 500 MG tablet Commonly known as: TYLENOL Take 2 tablets (1,000 mg total) by mouth every 8 (eight) hours.   cetirizine 10 MG tablet Commonly known as: ZYRTEC Take 1 tablet (10 mg total) by mouth daily.   cyclobenzaprine 5 MG tablet Commonly known as: FLEXERIL Take 1 tablet (5 mg total) by mouth 3 (three) times daily as needed for muscle spasms.   fluticasone 50 MCG/ACT nasal spray Commonly known as: FLONASE Place 1 spray into both nostrils 2 (two) times daily as needed for  rhinitis or allergies (allergies).   gabapentin 300 MG capsule Commonly known as: NEURONTIN Take 1 capsule (300 mg total) by mouth daily as needed (neurpoathy).   losartan 100 MG tablet Commonly known as: COZAAR Take 1 tablet (100 mg total) by mouth daily.   Opzelura 1.5 % Crea Generic drug: Ruxolitinib Phosphate Apply 1 application topically daily. Apply to rash QD until clear then PRN.   propranolol 20 MG tablet Commonly known as: INDERAL TAKE 1 TABLET(20 MG) BY MOUTH THREE TIMES DAILY   propylthiouracil 50 MG tablet Commonly known as: PTU TAKE 1 TABLET BY MOUTH 3 TIMES A DAY   sertraline 50 MG tablet Commonly known as: ZOLOFT Take 1 tablet (50 mg total) by mouth daily after breakfast.   spironolactone 50 MG tablet Commonly known as: ALDACTONE TAKE 1 TABLET BY MOUTH EVERY DAY   triamcinolone cream 0.1 % Commonly known as: KENALOG Apply 1 application topically 2 (two) times daily.          OBJECTIVE:   PHYSICAL EXAM: VS: BP 132/80 (BP Location: Left Arm, Patient Position: Sitting, Cuff Size: Large)    Pulse 64    Ht '5\' 5"'$  (1.651 m)    Wt 191 lb 12.8 oz (87 kg)    SpO2 98%    BMI 31.92 kg/m    EXAM: General: Pt appears well and is in NAD  Neck: General: Supple without adenopathy. Thyroid: Thyroid size normal.  No goiter or nodules appreciated.   Lungs: Clear with good BS bilat with no rales, rhonchi, or wheezes  Heart: Auscultation: RRR.  Abdomen: Normoactive bowel sounds, soft, nontender, without masses or organomegaly palpable  Extremities:  BL LE: No pretibial edema normal ROM and strength.  Neuro: Cranial nerves: II - XII grossly intact  Motor: Normal strength throughout DTRs: 2+ and symmetric in UE without delay in relaxation phase     DATA REVIEWED:  Latest Reference Range & Units 11/10/21 08:20  WBC 4.0 - 10.5 K/uL 6.1  RBC 3.87 - 5.11 Mil/uL 4.71  Hemoglobin 12.0 - 15.0 g/dL 12.3  HCT 36.0 - 46.0 % 37.3  MCV 78.0 - 100.0 fl 79.2  MCHC 30.0  - 36.0 g/dL 33.0  RDW 11.5 - 15.5 % 14.8  Platelets 150.0 - 400.0 K/uL 279.0  Neutrophils 43.0 - 77.0 % 46.5  Lymphocytes 12.0 - 46.0 % 41.8  Monocytes Relative 3.0 - 12.0 % 9.1  Eosinophil 0.0 - 5.0 % 2.0  Basophil 0.0 - 3.0 % 0.6  NEUT# 1.4 - 7.7 K/uL 2.8  Lymphocyte # 0.7 - 4.0 K/uL 2.6  Monocyte # 0.1 - 1.0 K/uL 0.6  Eosinophils Absolute 0.0 - 0.7 K/uL 0.1  Basophils Absolute 0.0 - 0.1 K/uL 0.0    Latest Reference Range & Units 11/10/21 08:20  TSH  0.35 - 5.50 uIU/mL 3.81  T4,Free(Direct) 0.60 - 1.60 ng/dL 0.84    Latest Reference Range & Units 08/25/21 14:03  Sodium 135 - 145 mEq/L 135  Potassium 3.5 - 5.1 mEq/L 4.3  Chloride 96 - 112 mEq/L 102  CO2 19 - 32 mEq/L 26  Glucose 70 - 99 mg/dL 69 (L)  BUN 6 - 23 mg/dL 9  Creatinine 0.40 - 1.20 mg/dL 1.01  Calcium 8.4 - 10.5 mg/dL 9.6  Alkaline Phosphatase 39 - 117 U/L 70  Albumin 3.5 - 5.2 g/dL 3.9  AST 0 - 37 U/L 17  ALT 0 - 35 U/L 17  Total Protein 6.0 - 8.3 g/dL 7.1  Total Bilirubin 0.2 - 1.2 mg/dL 0.4  GFR >60.00 mL/min 60.13    Latest Reference Range & Units 08/25/21 14:03  WBC 4.0 - 10.5 K/uL 6.7  RBC 3.87 - 5.11 Mil/uL 5.06  Hemoglobin 12.0 - 15.0 g/dL 12.9  HCT 36.0 - 46.0 % 40.1  MCV 78.0 - 100.0 fl 79.3  MCHC 30.0 - 36.0 g/dL 32.2  RDW 11.5 - 15.5 % 14.8  Platelets 150.0 - 400.0 K/uL 283.0    Latest Reference Range & Units 08/25/21 14:03  TSH 0.35 - 5.50 uIU/mL 3.22     ASSESSMENT / PLAN / RECOMMENDATIONS:   Hyperthyroidism:    - Clinically she is euthyroid  - D/D graves' disease vs toxic thyroid nodule (s) - We had discussed risks/benefits of PTU vs Methimazole but she opted to stay with PTU -She had thyroid ultrasound a year ago but due to recent symptoms of swelling and voice hoarseness we will proceed with repeat thyroid ultrasound -TFTs normal   Medications : Continue PTU 50 mg daily      2. Hx of Primary Hyperparathyroidism   - She is S/P parathyroidectomy 01/2021  - Repeat serum  calcium has normalized    3.  Voice hoarseness:   -We will proceed with thyroid ultrasound -We will proceed with CXR given that she is an ex-smoker -She has been referred to ENT by her PCP and December 2022, patient has not heard anything, patient advised to contact referring provider for further information   Follow-up in 6 months  Signed electronically by: Mack Guise, MD  [facility]  [facility] [address] [place], [place] [address] Phone: [phone] FAX: [phone]      CC: Crecencio Mc, MD [address] [place] [address] Phone: [phone]  Fax: [phone]   Return to Endocrinology clinic as below: No future appointments.

## 2021-11-10 ENCOUNTER — Encounter: Payer: Self-pay | Admitting: Internal Medicine

## 2021-11-10 ENCOUNTER — Ambulatory Visit
Admission: RE | Admit: 2021-11-10 | Discharge: 2021-11-10 | Disposition: A | Discharge status: Home / Self Care | Payer: BLUE CROSS/BLUE SHIELD | Source: Ambulatory Visit | Attending: Internal Medicine | Admitting: Internal Medicine

## 2021-11-10 ENCOUNTER — Other Ambulatory Visit: Payer: Self-pay

## 2021-11-10 ENCOUNTER — Ambulatory Visit: Payer: BLUE CROSS/BLUE SHIELD | Admitting: Internal Medicine

## 2021-11-10 VITALS — BP 132/80 | HR 64 | Ht 65.0 in | Wt 191.8 lb

## 2021-11-10 DIAGNOSIS — E0590 Thyrotoxicosis, unspecified without thyrotoxic crisis or storm: Secondary | ICD-10-CM | POA: Diagnosis not present

## 2021-11-10 DIAGNOSIS — R490 Dysphonia: Secondary | ICD-10-CM | POA: Diagnosis not present

## 2021-11-10 DIAGNOSIS — K449 Diaphragmatic hernia without obstruction or gangrene: Secondary | ICD-10-CM | POA: Diagnosis not present

## 2021-11-10 LAB — CBC WITH DIFFERENTIAL/PLATELET
Basophils Absolute: 0 10*3/uL (ref 0.0–0.1)
Basophils Relative: 0.6 % (ref 0.0–3.0)
Eosinophils Absolute: 0.1 10*3/uL (ref 0.0–0.7)
Eosinophils Relative: 2 % (ref 0.0–5.0)
HCT: 37.3 % (ref 36.0–46.0)
Hemoglobin: 12.3 g/dL (ref 12.0–15.0)
Lymphocytes Relative: 41.8 % (ref 12.0–46.0)
Lymphs Abs: 2.6 10*3/uL (ref 0.7–4.0)
MCHC: 33 g/dL (ref 30.0–36.0)
MCV: 79.2 fl (ref 78.0–100.0)
Monocytes Absolute: 0.6 10*3/uL (ref 0.1–1.0)
Monocytes Relative: 9.1 % (ref 3.0–12.0)
Neutro Abs: 2.8 10*3/uL (ref 1.4–7.7)
Neutrophils Relative %: 46.5 % (ref 43.0–77.0)
Platelets: 279 10*3/uL (ref 150.0–400.0)
RBC: 4.71 Mil/uL (ref 3.87–5.11)
RDW: 14.8 % (ref 11.5–15.5)
WBC: 6.1 10*3/uL (ref 4.0–10.5)

## 2021-11-10 LAB — TSH: TSH: 3.81 u[IU]/mL (ref 0.35–5.50)

## 2021-11-10 LAB — T4, FREE: Free T4: 0.84 ng/dL (ref 0.60–1.60)

## 2021-11-10 MED ORDER — PROPYLTHIOURACIL 50 MG PO TABS: 50.0000 mg | ORAL_TABLET | Freq: Every day | ORAL | 3 refills | Status: DC

## 2021-11-11 ENCOUNTER — Encounter: Payer: Self-pay | Admitting: Adult Health

## 2021-11-18 ENCOUNTER — Other Ambulatory Visit: Payer: Self-pay

## 2021-11-18 ENCOUNTER — Ambulatory Visit
Admission: RE | Admit: 2021-11-18 | Discharge: 2021-11-18 | Disposition: A | Discharge status: Home / Self Care | Payer: BLUE CROSS/BLUE SHIELD | Source: Ambulatory Visit | Attending: Internal Medicine | Admitting: Internal Medicine

## 2021-11-18 DIAGNOSIS — R490 Dysphonia: Secondary | ICD-10-CM

## 2022-01-14 DIAGNOSIS — R490 Dysphonia: Secondary | ICD-10-CM | POA: Diagnosis not present

## 2022-01-14 DIAGNOSIS — K219 Gastro-esophageal reflux disease without esophagitis: Secondary | ICD-10-CM | POA: Diagnosis not present

## 2022-02-14 ENCOUNTER — Other Ambulatory Visit: Payer: Self-pay | Admitting: Internal Medicine

## 2022-02-14 DIAGNOSIS — I10 Essential (primary) hypertension: Secondary | ICD-10-CM

## 2022-02-15 ENCOUNTER — Other Ambulatory Visit: Payer: Self-pay | Admitting: Internal Medicine

## 2022-02-15 DIAGNOSIS — I10 Essential (primary) hypertension: Secondary | ICD-10-CM

## 2022-05-13 ENCOUNTER — Ambulatory Visit: Payer: BLUE CROSS/BLUE SHIELD | Admitting: Internal Medicine

## 2022-05-13 ENCOUNTER — Encounter: Payer: Self-pay | Admitting: Internal Medicine

## 2022-05-13 VITALS — BP 116/74 | HR 70 | Ht 65.0 in | Wt 192.0 lb

## 2022-05-13 DIAGNOSIS — E0590 Thyrotoxicosis, unspecified without thyrotoxic crisis or storm: Secondary | ICD-10-CM

## 2022-05-13 LAB — T4, FREE: Free T4: 0.88 ng/dL (ref 0.60–1.60)

## 2022-05-13 LAB — TSH: TSH: 3.17 u[IU]/mL (ref 0.35–5.50)

## 2022-05-13 NOTE — Progress Notes (Signed)
Name: Elizabeth [name]  MRN/ DOB: [id number], 1961/02/01    Age/ Sex: 61 y.o., female     PCP: Crecencio Mc, MD   Reason for Endocrinology Evaluation: Hyperthyroidism/ Hypercalcemia      Initial Endocrinology Clinic Visit: 09/06/2019    PATIENT IDENTIFIER: Elizabeth [name] is a 61 y.o., female with a past medical history of HTN, Hyperthyroidism and Hypercalcemia . She has followed with [facility] since 09/06/2019 for consultative assistance with management of her Hypercalcemia and hyperthyroidism.      HISTORICAL SUMMARY: The patient was first diagnosed with hypercalcemia many years ago.  Ca/Cr ratio 0.013 in 11/2019 which is inconclusive.  DXA 09/25/2019 - normal results.  Status post right inferior parathyroidectomy 01/2021 with normalization of her calcium      THYROID HISTORY :  Has been diagnosed with hyperthyroidism in early 2000's. She has been on PTU since her diagnosis. Thyroid ultrasound in February 2022 and March 2023 showed subcentimeter nodules, and no further imaging was recommended    HTN: Renin was normal 0.464 ng/Ml/hr with normal aldosterone level at 9.1 ng/dL  In 07/2020. Prior to that she had inconclusive testing but confirmed normal by 07/2020 with normal serum potassium.   SUBJECTIVE:     Today (05/13/2022):  [name] is here for hyperthyroidism.    Weight stable  Denies fever or abdominal  Denies constipation or diarrhea  Denies palpitations  Has noted local neck swelling  Has noted voice hoarseness , was seen by ENT and was attributes to GERD, she has not started PPI therapy    PTU 50 mg daily      HISTORY:  Past Medical History:  Past Medical History:  Diagnosis Date   Anxiety    Arthritis    Complication of anesthesia    Slow to wake up   Dental bridge present    permanent upper   GERD (gastroesophageal reflux disease)    Gout    Hypertension    Hyperthyroidism    Thyroid disease    Vertigo    Past  Surgical History:  Past Surgical History:  Procedure Laterality Date   ENDOMETRIAL ABLATION  2010   PARATHYROIDECTOMY Right 01/05/2021   Procedure: RIGHT INFERIOR PARATHYROIDECTOMY;  Surgeon: Armandina Gemma, MD;  Location: [facility];  Service: General;  Laterality: Right;   SHOULDER ARTHROSCOPY WITH ROTATOR CUFF REPAIR AND SUBACROMIAL DECOMPRESSION Right 01/16/2021   Procedure: Right shoulder arthroscopic subscapularis repair, rotator cuff repair, subacromial decompression, and biceps tenodesis - Reche Dixon to Assist;  Surgeon: Leim Fabry, MD;  Location: [facility];  Service: Orthopedics;  Laterality: Right;   WRIST SURGERY Right    Social History:  reports that she quit smoking about 23 years ago. Her smoking use included cigarettes. She has never used smokeless tobacco. She reports current alcohol use. She reports that she does not use drugs. Family History:  Family History  Problem Relation Age of Onset   Hypertension Mother    Heart disease Mother    Heart failure Mother    Hypertension Father      HOME MEDICATIONS: Allergies as of 05/13/2022       Reactions   Codeine    Groggy   Metoprolol    SOB   Tramadol Other (See Comments)   Made her a zombie        Medication List        Accurate as of May 13, 2022 10:09 AM. If you have any questions, ask your nurse or  doctor.          cetirizine 10 MG tablet Commonly known as: ZYRTEC Take 1 tablet (10 mg total) by mouth daily.   cyclobenzaprine 5 MG tablet Commonly known as: FLEXERIL Take 1 tablet (5 mg total) by mouth 3 (three) times daily as needed for muscle spasms.   fluticasone 50 MCG/ACT nasal spray Commonly known as: FLONASE Place 1 spray into both nostrils 2 (two) times daily as needed for rhinitis or allergies (allergies).   gabapentin 300 MG capsule Commonly known as: NEURONTIN Take 1 capsule (300 mg total) by mouth daily as needed (neurpoathy).   losartan 100 MG tablet Commonly known as:  COZAAR TAKE 1 TABLET BY MOUTH EVERY DAY   Opzelura 1.5 % Crea Generic drug: Ruxolitinib Phosphate Apply 1 application topically daily. Apply to rash QD until clear then PRN.   propranolol 20 MG tablet Commonly known as: INDERAL TAKE 1 TABLET BY MOUTH THREE TIMES A DAY   propylthiouracil 50 MG tablet Commonly known as: PTU Take 1 tablet (50 mg total) by mouth daily.   sertraline 50 MG tablet Commonly known as: ZOLOFT Take 1 tablet (50 mg total) by mouth daily after breakfast.   spironolactone 50 MG tablet Commonly known as: ALDACTONE TAKE 1 TABLET BY MOUTH EVERY DAY   triamcinolone cream 0.1 % Commonly known as: KENALOG Apply 1 application topically 2 (two) times daily.          OBJECTIVE:   PHYSICAL EXAM: VS: BP 116/74 (BP Location: Left Arm, Patient Position: Sitting, Cuff Size: Small)   Pulse 70   Ht '5\' 5"'$  (1.651 m)   Wt 192 lb (87.1 kg)   SpO2 97%   BMI 31.95 kg/m    EXAM: General: Pt appears well and is in NAD  Neck: General: Supple without adenopathy. Thyroid: Thyroid size normal.  No goiter or nodules appreciated.   Lungs: Clear with good BS bilat with no rales, rhonchi, or wheezes  Heart: Auscultation: RRR.  Abdomen: Normoactive bowel sounds, soft, nontender, without masses or organomegaly palpable  Extremities:  BL LE: No pretibial edema normal ROM and strength.  Neuro: Cranial nerves: II - XII grossly intact  Motor: Normal strength throughout DTRs: 2+ and symmetric in UE without delay in relaxation phase     DATA REVIEWED:     Latest Reference Range & Units 05/13/22 10:42  TSH 0.35 - 5.50 uIU/mL 3.17  Triiodothyronine (T3) 76 - 181 ng/dL 118  T4,Free(Direct) 0.60 - 1.60 ng/dL 0.88      Latest Reference Range & Units 11/10/21 08:20  WBC 4.0 - 10.5 K/uL 6.1  RBC 3.87 - 5.11 Mil/uL 4.71  Hemoglobin 12.0 - 15.0 g/dL 12.3  HCT 36.0 - 46.0 % 37.3  MCV 78.0 - 100.0 fl 79.2  MCHC 30.0 - 36.0 g/dL 33.0  RDW 11.5 - 15.5 % 14.8  Platelets  150.0 - 400.0 K/uL 279.0  Neutrophils 43.0 - 77.0 % 46.5  Lymphocytes 12.0 - 46.0 % 41.8  Monocytes Relative 3.0 - 12.0 % 9.1  Eosinophil 0.0 - 5.0 % 2.0  Basophil 0.0 - 3.0 % 0.6  NEUT# 1.4 - 7.7 K/uL 2.8  Lymphocyte # 0.7 - 4.0 K/uL 2.6  Monocyte # 0.1 - 1.0 K/uL 0.6  Eosinophils Absolute 0.0 - 0.7 K/uL 0.1  Basophils Absolute 0.0 - 0.1 K/uL 0.0     Latest Reference Range & Units 08/25/21 14:03  Sodium 135 - 145 mEq/L 135  Potassium 3.5 - 5.1 mEq/L 4.3  Chloride 96 -  112 mEq/L 102  CO2 19 - 32 mEq/L 26  Glucose 70 - 99 mg/dL 69 (L)  BUN 6 - 23 mg/dL 9  Creatinine 0.40 - 1.20 mg/dL 1.01  Calcium 8.4 - 10.5 mg/dL 9.6  Alkaline Phosphatase 39 - 117 U/L 70  Albumin 3.5 - 5.2 g/dL 3.9  AST 0 - 37 U/L 17  ALT 0 - 35 U/L 17  Total Protein 6.0 - 8.3 g/dL 7.1  Total Bilirubin 0.2 - 1.2 mg/dL 0.4  GFR >60.00 mL/min 60.13   Thyroid ultrasound 11/18/2021   Estimated total number of nodules >/= 1 cm: 0   Number of spongiform nodules >/=  2 cm not described below (TR1): 0   Number of mixed cystic and solid nodules >/= 1.5 cm not described below (TR2): 0   _________________________________________________________   Unchanged hypoechoic 6 mm focus in the right thyroid which does not meet criteria for surveillance or biopsy.   Unchanged 7 mm hypoechoic focus in the left thyroid which does not meet criteria for surveillance or biopsy.   No adenopathy.  No focal fluid identified.   Recommendations follow those established by the new ACR TI-RADS criteria (J Am Coll Radiol 7989;21:194-174).   IMPRESSION: Similar appearance of mild heterogeneity of the thyroid tissue, may indicate medical thyroid disease.   Negative for adenopathy.   No ultrasound finding that would account for new onset of coarseness. Contrast-enhanced neck CT may be considered.     ASSESSMENT / PLAN / RECOMMENDATIONS:   Hyperthyroidism:    - Clinically she is euthyroid  - D/D graves' disease vs toxic  thyroid nodule (s) - We had discussed risks/benefits of PTU vs Methimazole but she opted to stay with PTU -She had thyroid ultrasound a year ago but due to recent symptoms of swelling and voice hoarseness we will proceed with repeat thyroid ultrasound -TFTs normal   Medications : Continue PTU 50 mg daily         Follow-up in 6 months  Signed electronically by: Mack Guise, MD  [facility]  [facility] [address] [place], [place] [address] Phone: [phone] FAX: [phone]      CC: Crecencio Mc, MD [address] [place] [address] Phone: [phone]  Fax: [phone]   Return to Endocrinology clinic as below: Future Appointments  Date Time Provider [place]  05/13/2022 10:10 AM Demetrice Amstutz, Melanie Crazier, MD [id number] None

## 2022-05-14 LAB — T3: T3, Total: 118 ng/dL (ref 76–181)

## 2022-05-14 MED ORDER — PROPYLTHIOURACIL 50 MG PO TABS: 50.0000 mg | ORAL_TABLET | Freq: Every day | ORAL | 3 refills | Status: DC

## 2022-08-15 ENCOUNTER — Other Ambulatory Visit: Payer: Self-pay | Admitting: Internal Medicine

## 2022-08-15 DIAGNOSIS — I10 Essential (primary) hypertension: Secondary | ICD-10-CM

## 2022-08-20 ENCOUNTER — Ambulatory Visit: Payer: BLUE CROSS/BLUE SHIELD | Admitting: Internal Medicine

## 2022-08-20 ENCOUNTER — Encounter: Payer: Self-pay | Admitting: Internal Medicine

## 2022-08-20 VITALS — BP 150/84 | HR 68 | Temp 98.2°F | Ht 65.0 in | Wt 195.0 lb

## 2022-08-20 DIAGNOSIS — R202 Paresthesia of skin: Secondary | ICD-10-CM

## 2022-08-20 DIAGNOSIS — I159 Secondary hypertension, unspecified: Secondary | ICD-10-CM | POA: Diagnosis not present

## 2022-08-20 DIAGNOSIS — R7301 Impaired fasting glucose: Secondary | ICD-10-CM | POA: Diagnosis not present

## 2022-08-20 DIAGNOSIS — Z1231 Encounter for screening mammogram for malignant neoplasm of breast: Secondary | ICD-10-CM | POA: Diagnosis not present

## 2022-08-20 DIAGNOSIS — E213 Hyperparathyroidism, unspecified: Secondary | ICD-10-CM

## 2022-08-20 DIAGNOSIS — G2581 Restless legs syndrome: Secondary | ICD-10-CM

## 2022-08-20 DIAGNOSIS — E785 Hyperlipidemia, unspecified: Secondary | ICD-10-CM

## 2022-08-20 DIAGNOSIS — R5383 Other fatigue: Secondary | ICD-10-CM | POA: Diagnosis not present

## 2022-08-20 DIAGNOSIS — R944 Abnormal results of kidney function studies: Secondary | ICD-10-CM

## 2022-08-20 DIAGNOSIS — R200 Anesthesia of skin: Secondary | ICD-10-CM

## 2022-08-20 DIAGNOSIS — E0590 Thyrotoxicosis, unspecified without thyrotoxic crisis or storm: Secondary | ICD-10-CM

## 2022-08-20 DIAGNOSIS — E892 Postprocedural hypoparathyroidism: Secondary | ICD-10-CM

## 2022-08-20 DIAGNOSIS — R0683 Snoring: Secondary | ICD-10-CM

## 2022-08-20 DIAGNOSIS — I10 Essential (primary) hypertension: Secondary | ICD-10-CM

## 2022-08-20 DIAGNOSIS — G629 Polyneuropathy, unspecified: Secondary | ICD-10-CM

## 2022-08-20 LAB — CBC WITH DIFFERENTIAL/PLATELET
Basophils Absolute: 0 10*3/uL (ref 0.0–0.1)
Basophils Relative: 0.6 % (ref 0.0–3.0)
Eosinophils Absolute: 0.3 10*3/uL (ref 0.0–0.7)
Eosinophils Relative: 3.8 % (ref 0.0–5.0)
HCT: 39.4 % (ref 36.0–46.0)
Hemoglobin: 12.8 g/dL (ref 12.0–15.0)
Lymphocytes Relative: 41.7 % (ref 12.0–46.0)
Lymphs Abs: 3 10*3/uL (ref 0.7–4.0)
MCHC: 32.6 g/dL (ref 30.0–36.0)
MCV: 78.6 fl (ref 78.0–100.0)
Monocytes Absolute: 0.6 10*3/uL (ref 0.1–1.0)
Monocytes Relative: 8 % (ref 3.0–12.0)
Neutro Abs: 3.3 10*3/uL (ref 1.4–7.7)
Neutrophils Relative %: 45.9 % (ref 43.0–77.0)
Platelets: 305 10*3/uL (ref 150.0–400.0)
RBC: 5.01 Mil/uL (ref 3.87–5.11)
RDW: 15.2 % (ref 11.5–15.5)
WBC: 7.2 10*3/uL (ref 4.0–10.5)

## 2022-08-20 LAB — COMPREHENSIVE METABOLIC PANEL
ALT: 15 U/L (ref 0–35)
AST: 17 U/L (ref 0–37)
Albumin: 4.2 g/dL (ref 3.5–5.2)
Alkaline Phosphatase: 79 U/L (ref 39–117)
BUN: 14 mg/dL (ref 6–23)
CO2: 29 mEq/L (ref 19–32)
Calcium: 9.7 mg/dL (ref 8.4–10.5)
Chloride: 101 mEq/L (ref 96–112)
Creatinine, Ser: 1.04 mg/dL (ref 0.40–1.20)
GFR: 57.65 mL/min — ABNORMAL LOW (ref >60.00)
Glucose, Bld: 96 mg/dL (ref 70–99)
Potassium: 3.7 mEq/L (ref 3.5–5.1)
Sodium: 138 mEq/L (ref 135–145)
Total Bilirubin: 0.5 mg/dL (ref 0.2–1.2)
Total Protein: 7.5 g/dL (ref 6.0–8.3)

## 2022-08-20 LAB — MICROALBUMIN / CREATININE URINE RATIO
Creatinine,U: 61 mg/dL
Microalb Creat Ratio: 1.1 mg/g (ref 0.0–30.0)
Microalb, Ur: <0.7 mg/dL (ref 0.0–1.9)

## 2022-08-20 LAB — B12 AND FOLATE PANEL
Folate: 11.9 ng/mL (ref >5.9)
Vitamin B-12: 405 pg/mL (ref 211–911)

## 2022-08-20 LAB — HEMOGLOBIN A1C: Hgb A1c MFr Bld: 6.3 % (ref 4.6–6.5)

## 2022-08-20 MED ORDER — GABAPENTIN 300 MG PO CAPS: 300.0000 mg | ORAL_CAPSULE | Freq: Every day | ORAL | 2 refills | Status: DC | PRN

## 2022-08-20 MED ORDER — SERTRALINE HCL 50 MG PO TABS: 50.0000 mg | ORAL_TABLET | Freq: Every day | ORAL | 1 refills | Status: DC

## 2022-08-20 NOTE — Patient Instructions (Signed)
PLEASE SCHEDULE A NURSE VISIT TO HAVE YOUR HOME BLOOD PRESSURE MACHINE EVALUATED   SLEEP STUDY ORDERED   MRI ORDERED OF CERVICAL SPINE

## 2022-08-20 NOTE — Progress Notes (Unsigned)
Subjective:  Patient ID: Elizabeth [name], female    DOB: 07/12/1961  Age: 61 y.o. MRN: [id number]  CC: The primary encounter diagnosis was Encounter for screening mammogram for malignant neoplasm of breast. Diagnoses of Fatigue, unspecified type, Hyperlipidemia, unspecified hyperlipidemia type, Hyperparathyroidism ([place]), Secondary hypertension, Impaired fasting glucose, Numbness and tingling in left arm, Loud snoring, Neuropathy, Restless legs syndrome, S/P removal of parathyroid gland ([place]), Decreased glomerular filtration rate (GFR), and Essential hypertension were also pertinent to this visit.   HPI Elizabeth [name] presents for follow up on chronic issues.  Chief Complaint  Patient presents with   Pain    Neck pain   Fatigue   circulation issues   Patient underwent  parathyroidecteomy May 2022 followed by right rotator cuff surgery .  Since the surgery she has noted a feeling of numbness that involves  the left arm feels , rom the shoulder to the fingers.   The forearm and lower upper arm will spasm on a daily basis if she moves it suddenly, as well as  occasional  spasms in her  trazpezius muscle and occipital area on the left.  .  Numbness also wakes her from sleep .fingers are really bothersome.  The right side is not affected similarly   Reviewd EMG studies fro Feb 2021 ([facility])  which did not meet criteria for CTS but symptoms  shave progressed significnatly since then  Retired in 2010 from [employer] work , used a Journalist, newspaper continually 8 hours daily. for 4 years.  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    MRI discussed   2) Fatigue : constant. Since she retired in 2010.   T  3) Hyperthyroid  managed by endocrinology with PTU.  4)Insomnia:  Sleep occasionally disrupted by  RLS  .  Takes gabapentin "as needed"  during the day for any pain complaint.   not more than 1-2 times per week .Snores"terribly"  per husband  who notes that she makes choking sounds during her sleep   3)  HTN:  stopped spironolactone 5 days ago bc of dizziness and low blood pressure (< 364 systolic )  using her daughter's  machine.    Outpatient Medications Prior to Visit  Medication Sig Dispense Refill   cetirizine (ZYRTEC) 10 MG tablet Take 1 tablet (10 mg total) by mouth daily. 30 tablet 11   cyclobenzaprine (FLEXERIL) 5 MG tablet Take 1 tablet (5 mg total) by mouth 3 (three) times daily as needed for muscle spasms. 90 tablet 2   fluticasone (FLONASE) 50 MCG/ACT nasal spray Place 1 spray into both nostrils 2 (two) times daily as needed for rhinitis or allergies (allergies). 16 mL 1   losartan (COZAAR) 100 MG tablet TAKE 1 TABLET BY MOUTH EVERY DAY 90 tablet 3   propranolol (INDERAL) 20 MG tablet TAKE 1 TABLET BY MOUTH THREE TIMES A DAY 270 tablet 1   propylthiouracil (PTU) 50 MG tablet Take 1 tablet (50 mg total) by mouth daily. 90 tablet 3   Ruxolitinib Phosphate (OPZELURA) 1.5 % CREA Apply 1 application topically daily. Apply to rash QD until clear then PRN. 60 g 1   spironolactone (ALDACTONE) 50 MG tablet TAKE 1 TABLET BY MOUTH EVERY DAY 90 tablet 1   triamcinolone cream (KENALOG) 0.1 % Apply 1 application topically 2 (two) times daily. 30 g 0   gabapentin (NEURONTIN) 300 MG capsule Take 1 capsule (300 mg total) by mouth daily as needed (neurpoathy). 30 capsule 2   sertraline (ZOLOFT) 50 MG  tablet Take 1 tablet (50 mg total) by mouth daily after breakfast. 90 tablet 1   No facility-administered medications prior to visit.    Review of Systems;  Patient denies headache, fevers, malaise, unintentional weight loss, skin rash, eye pain, sinus congestion and sinus pain, sore throat, dysphagia,  hemoptysis , cough, dyspnea, wheezing, chest pain, palpitations, orthopnea, edema, abdominal pain, nausea, melena, diarrhea, constipation, flank pain, dysuria, hematuria, urinary  Frequency, nocturia, numbness, tingling, seizures,  Focal weakness, Loss of consciousness,  Tremor, insomnia, depression,  anxiety, and suicidal ideation.      Objective:  BP (!) 150/84   Pulse 68   Temp 98.2 F (36.8 C) (Oral)   Ht '5\' 5"'$  (1.651 m)   Wt 195 lb (88.5 kg)   SpO2 97%   BMI 32.45 kg/m   BP Readings from Last 3 Encounters:  08/20/22 (!) 150/84  05/13/22 116/74  11/10/21 132/80    Wt Readings from Last 3 Encounters:  08/20/22 195 lb (88.5 kg)  05/13/22 192 lb (87.1 kg)  11/10/21 191 lb 12.8 oz (87 kg)    Physical Exam Vitals reviewed. Exam conducted with a chaperone present.  Constitutional:      General: She is not in acute distress.    Appearance: Normal appearance. She is normal weight. She is not ill-appearing, toxic-appearing or diaphoretic.  HENT:     Head: Normocephalic.  Eyes:     General: No scleral icterus.       Right eye: No discharge.        Left eye: No discharge.     Conjunctiva/sclera: Conjunctivae normal.  Cardiovascular:     Rate and Rhythm: Normal rate and regular rhythm.     Heart sounds: Normal heart sounds.  Pulmonary:     Effort: Pulmonary effort is normal. No respiratory distress.     Breath sounds: Normal breath sounds.  Musculoskeletal:        General: Normal range of motion.  Skin:    General: Skin is warm and dry.  Neurological:     General: No focal deficit present.     Mental Status: She is alert and oriented to person, place, and time. Mental status is at baseline.     Sensory: No sensory deficit.     Motor: No weakness.     Deep Tendon Reflexes: Reflexes normal.  Psychiatric:        Mood and Affect: Mood normal.        Behavior: Behavior normal.        Thought Content: Thought content normal.        Judgment: Judgment normal.     Lab Results  Component Value Date   HGBA1C 6.3 08/20/2022   HGBA1C 5.7 08/23/2019   HGBA1C 5.8 01/23/2019    Lab Results  Component Value Date   CREATININE 1.04 08/20/2022   CREATININE 1.01 08/25/2021   CREATININE 1.09 (H) 07/10/2021    Lab Results  Component Value Date   WBC 7.2 08/20/2022    HGB 12.8 08/20/2022   HCT 39.4 08/20/2022   PLT 305.0 08/20/2022   GLUCOSE 96 08/20/2022   CHOL 238 (H) 02/27/2019   TRIG 125.0 02/27/2019   HDL 54.90 02/27/2019   LDLCALC 158 (H) 02/27/2019   ALT 15 08/20/2022   AST 17 08/20/2022   NA 138 08/20/2022   K 3.7 08/20/2022   CL 101 08/20/2022   CREATININE 1.04 08/20/2022   BUN 14 08/20/2022   CO2 29 08/20/2022   TSH 3.17 05/13/2022  HGBA1C 6.3 08/20/2022   MICROALBUR <0.7 08/20/2022    US THYROID  Result Date: 11/18/2021 CLINICAL DATA:  61 year old female with a history of right parathyroidectomy May 2022, with recent development of symptoms and hoarseness EXAM: THYROID ULTRASOUND TECHNIQUE: Ultrasound examination of the thyroid gland and adjacent soft tissues was performed. COMPARISON:  Ultrasound 10/28/2020 FINDINGS: Parenchymal Echotexture: Mildly heterogenous Isthmus: 0.1 cm Right lobe: 5.0 cm x 1.7 cm x 1.5 cm Left lobe: 4.0 cm x 1.8 cm x 1.4 cm _________________________________________________________ Estimated total number of nodules >/= 1 cm: 0 Number of spongiform nodules >/=  2 cm not described below (TR1): 0 Number of mixed cystic and solid nodules >/= 1.5 cm not described below (TR2): 0 _________________________________________________________ Unchanged hypoechoic 6 mm focus in the right thyroid which does not meet criteria for surveillance or biopsy. Unchanged 7 mm hypoechoic focus in the left thyroid which does not meet criteria for surveillance or biopsy. No adenopathy.  No focal fluid identified. Recommendations follow those established by the new ACR TI-RADS criteria (J Am Coll Radiol 7846;96:295-284). IMPRESSION: Similar appearance of mild heterogeneity of the thyroid tissue, may indicate medical thyroid disease. Negative for adenopathy. No ultrasound finding that would account for new onset of coarseness. Contrast-enhanced neck CT may be considered. Electronically Signed   By: Corrie Mckusick D.O.   On: 11/18/2021 14:06     Assessment & Plan:  .Encounter for screening mammogram for malignant neoplasm of breast -     3D Screening Mammogram, Left and Right; Future  Fatigue, unspecified type -     CBC with Differential/Platelet -     B12 and Folate Panel -     Home sleep test  Hyperlipidemia, unspecified hyperlipidemia type  Hyperparathyroidism ([place]) -     Comprehensive metabolic panel  Secondary hypertension -     Microalbumin / creatinine urine ratio -     Home sleep test  Impaired fasting glucose -     Hemoglobin A1c  Numbness and tingling in left arm -     MR CERVICAL SPINE WO CONTRAST; Future  Loud snoring -     Home sleep test  Neuropathy Assessment & Plan: EMG studies done 2 years ago were abnormal but did not meet criteria for CTS.  Repeat studies are needed given her reports of left left arm numbness and recurrent spasms.  Suspect cerivcal disk disease.  If MRI is not authorized will refer  to Neurology for EMG .nerve conduction studies of the upper limbs    Restless legs syndrome Assessment & Plan: Symptoms are intermittent    S/P removal of parathyroid gland [facility]) Assessment & Plan: Surgery was done in May 2022.  Repeat calcium level is normal  Lab Results  Component Value Date   PTH 96 (H) 10/16/2020   CALCIUM 9.7 08/20/2022   CAION 5.14 06/09/2021      Decreased glomerular filtration rate (GFR) Assessment & Plan: Intermittent and mild.proteinuria has resolved with use of AR B  Lab Results  Component Value Date   CREATININE 1.04 08/20/2022   Lab Results  Component Value Date   MICROALBUR <0.7 08/20/2022   MICROALBUR 3.3 (H) 08/23/2019        Essential hypertension Assessment & Plan: Reading is elevated today because she suspended spironolactone recently.  She has been asked to check her BP at  home  and  return in a RN visit with her home meter. Advised to resume spironolactone given diagnosis of hyperaldosteronism   Other orders -  Gabapentin;  Take 1 capsule (300 mg total) by mouth daily as needed (neurpoathy).  Dispense: 30 capsule; Refill: 2 -     Sertraline HCl; Take 1 tablet (50 mg total) by mouth daily after breakfast.  Dispense: 90 tablet; Refill: 1     I provided 30 minutes of face-to-face time during this encounter reviewing patient's last visit with me, patient's  most recent visit with endocrinology and neurology,  recent surgical and non surgical procedures, previous  labs and imaging studies, counseling on currently addressed issues,  and post visit ordering to diagnostics and therapeutics .   Follow-up: Return in about 3 months (around 11/19/2022).   Crecencio Mc, MD

## 2022-08-22 NOTE — Assessment & Plan Note (Addendum)
Intermittent and mild.proteinuria has resolved with use of AR B  Lab Results  Component Value Date   CREATININE 1.04 08/20/2022   Lab Results  Component Value Date   MICROALBUR <0.7 08/20/2022   MICROALBUR 3.3 (H) 08/23/2019

## 2022-08-22 NOTE — Assessment & Plan Note (Signed)
Symptoms are intermittent

## 2022-08-22 NOTE — Assessment & Plan Note (Signed)
EMG studies done 2 years ago were abnormal but did not meet criteria for CTS.  Repeat studies are needed given her reports of left left arm numbness and recurrent spasms.  Suspect cerivcal disk disease.  If MRI is not authorized will refer  to Neurology for EMG .nerve conduction studies of the upper limbs

## 2022-08-22 NOTE — Assessment & Plan Note (Signed)
Secondary to Grave's disease per Endocrinology, managed with  PTU and propranolol.  TSH is WNL  Lab Results  Component Value Date   TSH 3.17 05/13/2022

## 2022-08-22 NOTE — Assessment & Plan Note (Signed)
Surgery was done in May 2022.  Repeat calcium level is normal  Lab Results  Component Value Date   PTH 96 (H) 10/16/2020   CALCIUM 9.7 08/20/2022   CAION 5.14 06/09/2021

## 2022-08-22 NOTE — Assessment & Plan Note (Signed)
Reading is elevated today because she suspended spironolactone recently.  She has been asked to check her BP at  home  and  return in a RN visit with her home meter. Advised to resume spironolactone given diagnosis of hyperaldosteronism

## 2022-09-01 ENCOUNTER — Telehealth: Payer: Self-pay | Admitting: Internal Medicine

## 2022-09-01 MED ORDER — DIAZEPAM 5 MG PO TABS: 5.0000 mg | ORAL_TABLET | ORAL | 0 refills | Status: DC | PRN

## 2022-09-01 NOTE — Telephone Encounter (Signed)
Valium 5 mg sent to her pharmacy .  Take 5 mg one hour prior to MRI  may repeat if needed.   She will need to have someone drive her to the MRI if she takes this medication since it may make her too relaxed to drive

## 2022-09-01 NOTE — Telephone Encounter (Signed)
Pt is aware and gave a verbal understanding.  

## 2022-09-01 NOTE — Addendum Note (Signed)
Addended by: Crecencio Mc on: 09/01/2022 03:42 PM   Modules accepted: Orders

## 2022-09-01 NOTE — Telephone Encounter (Signed)
Pt is scheduled for her MRI on 09/10/22 at 11am. Pt is requesting if she can get a Rx called in to relax her.   Pharmacy is  [id number] [place] [address]: [phone] Fax: [phone]    Please advise and Thank you!

## 2022-09-10 ENCOUNTER — Ambulatory Visit
Admission: RE | Admit: 2022-09-10 | Discharge: 2022-09-10 | Disposition: A | Discharge status: Home / Self Care | Payer: BLUE CROSS/BLUE SHIELD | Source: Ambulatory Visit | Attending: Internal Medicine | Admitting: Internal Medicine

## 2022-09-10 DIAGNOSIS — R202 Paresthesia of skin: Secondary | ICD-10-CM | POA: Diagnosis not present

## 2022-09-10 DIAGNOSIS — R200 Anesthesia of skin: Secondary | ICD-10-CM | POA: Diagnosis not present

## 2022-09-10 DIAGNOSIS — M5412 Radiculopathy, cervical region: Secondary | ICD-10-CM | POA: Diagnosis not present

## 2022-10-01 IMAGING — DX DG CHEST 2V
2 series · 2 of 2 positions shown · non-contrast
Comparison: None.

CLINICAL DATA: Cough.

EXAM:
CHEST - 2 VIEW

[chest pa]
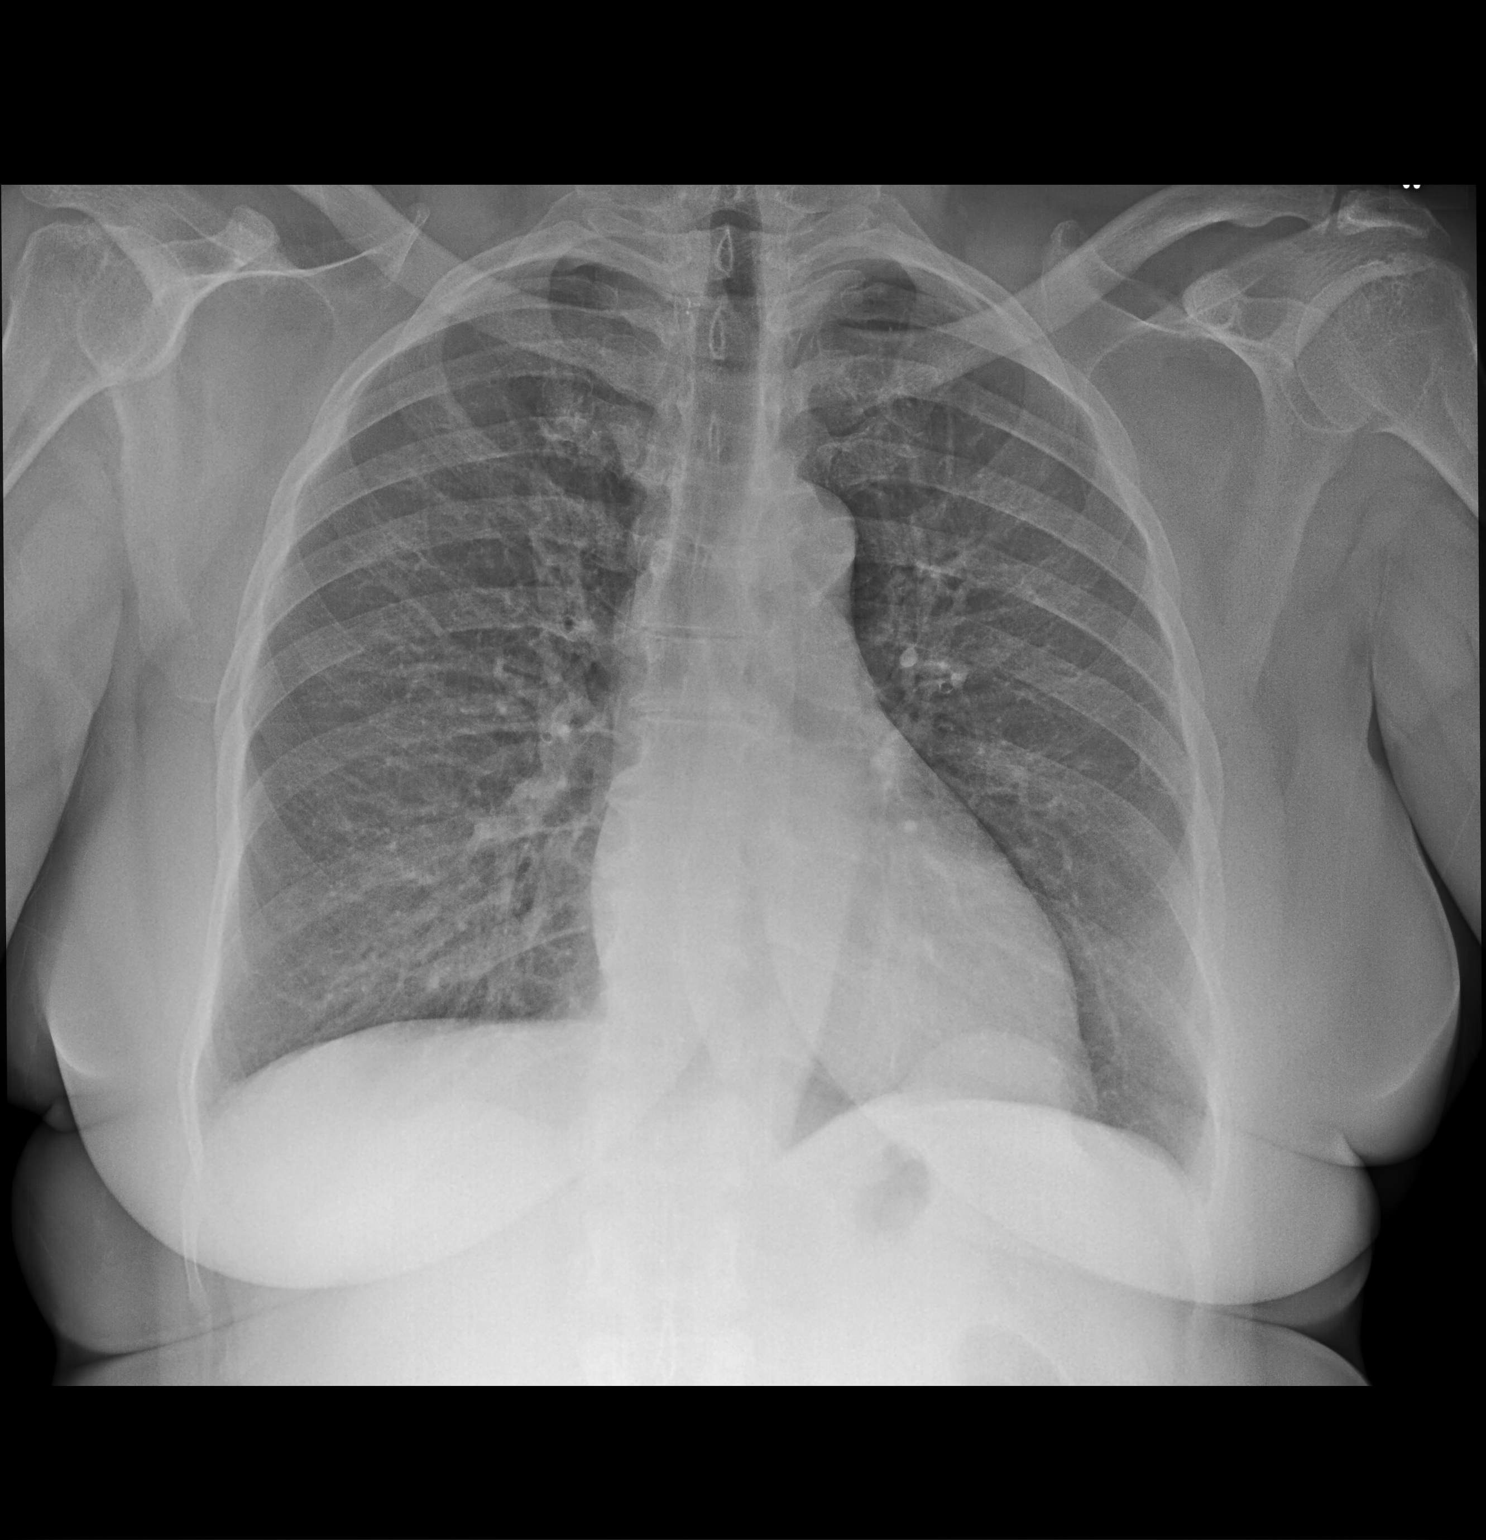

[chest lat]
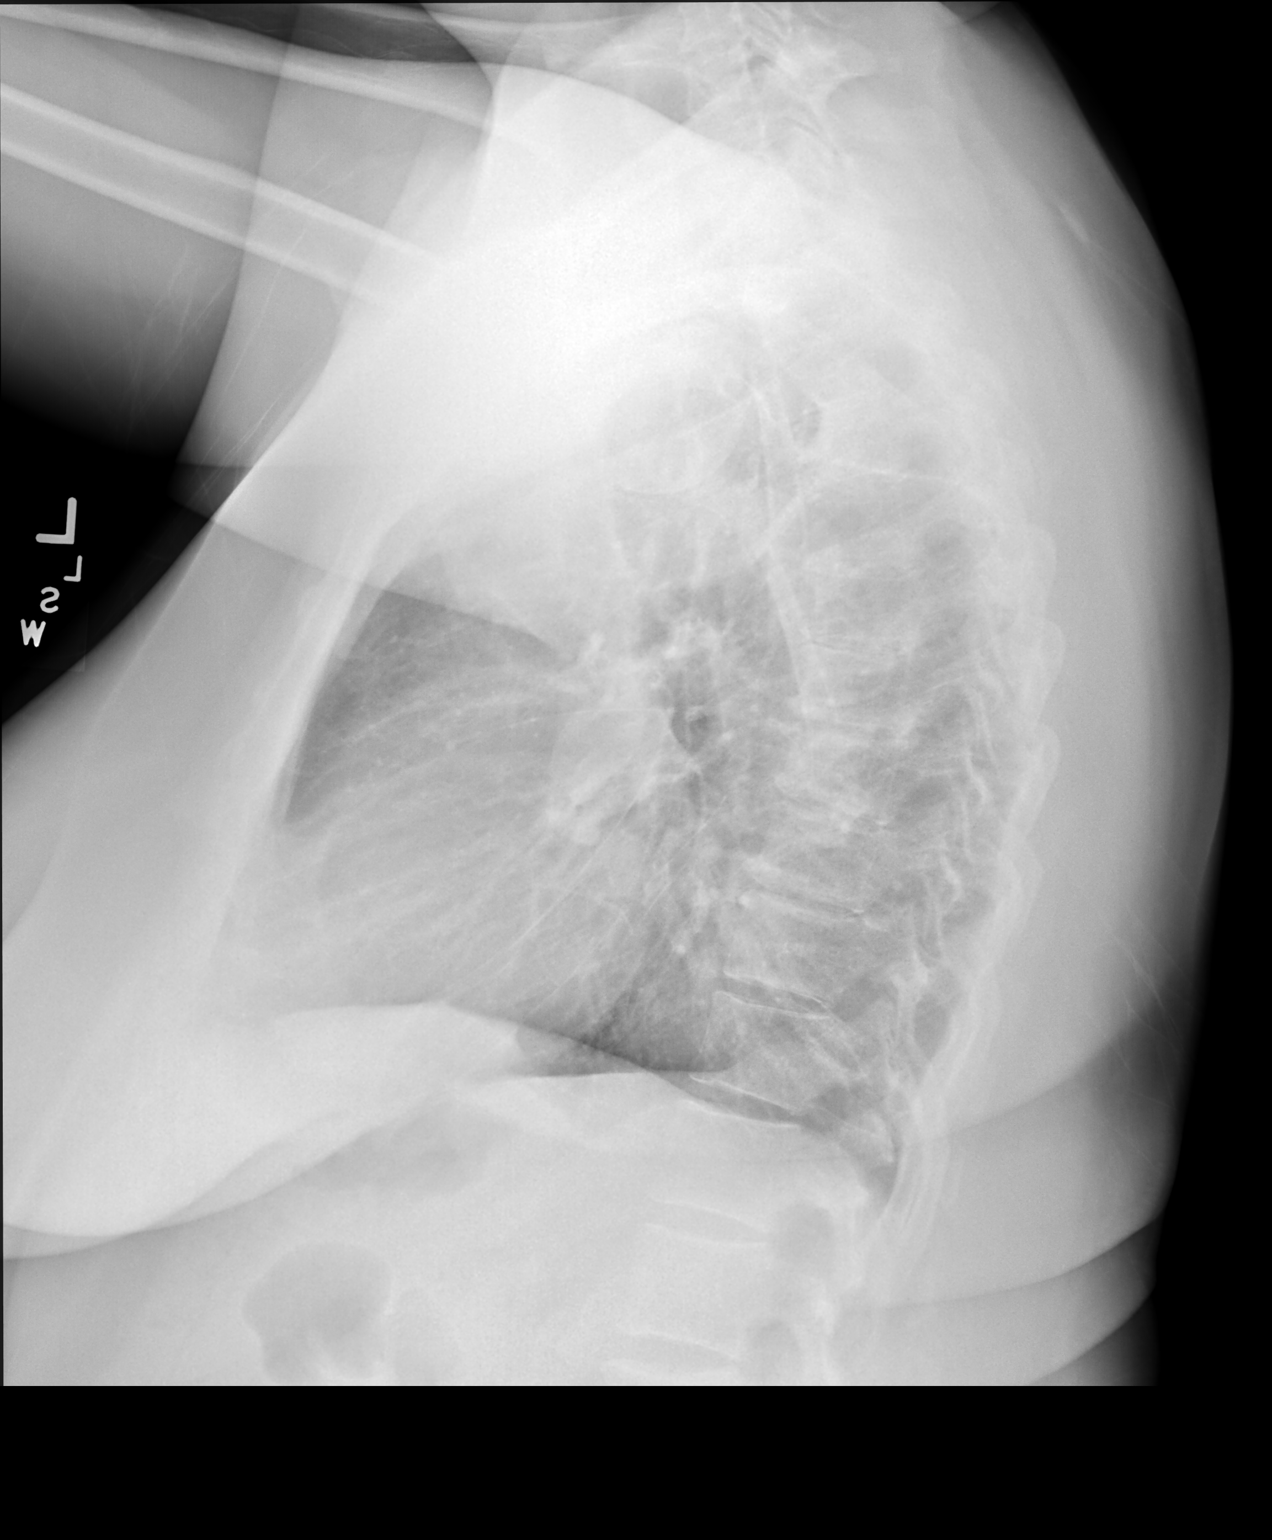

[2 of 2 positions shown; findings below may reference images not displayed]

FINDINGS: The heart size and mediastinal contours are within normal limits.
Both lungs are clear. The visualized skeletal structures are
unremarkable.
IMPRESSION: No active cardiopulmonary disease.

## 2023-01-14 IMAGING — CR DG CHEST 2V
2 series · 2 of 2 positions shown · non-contrast
Comparison: Chest x-ray 07/28/2021.

CLINICAL DATA: 60-year-old female with history of new onset of
hoarseness of voice.

EXAM:
CHEST - 2 VIEW

[w chest pa]
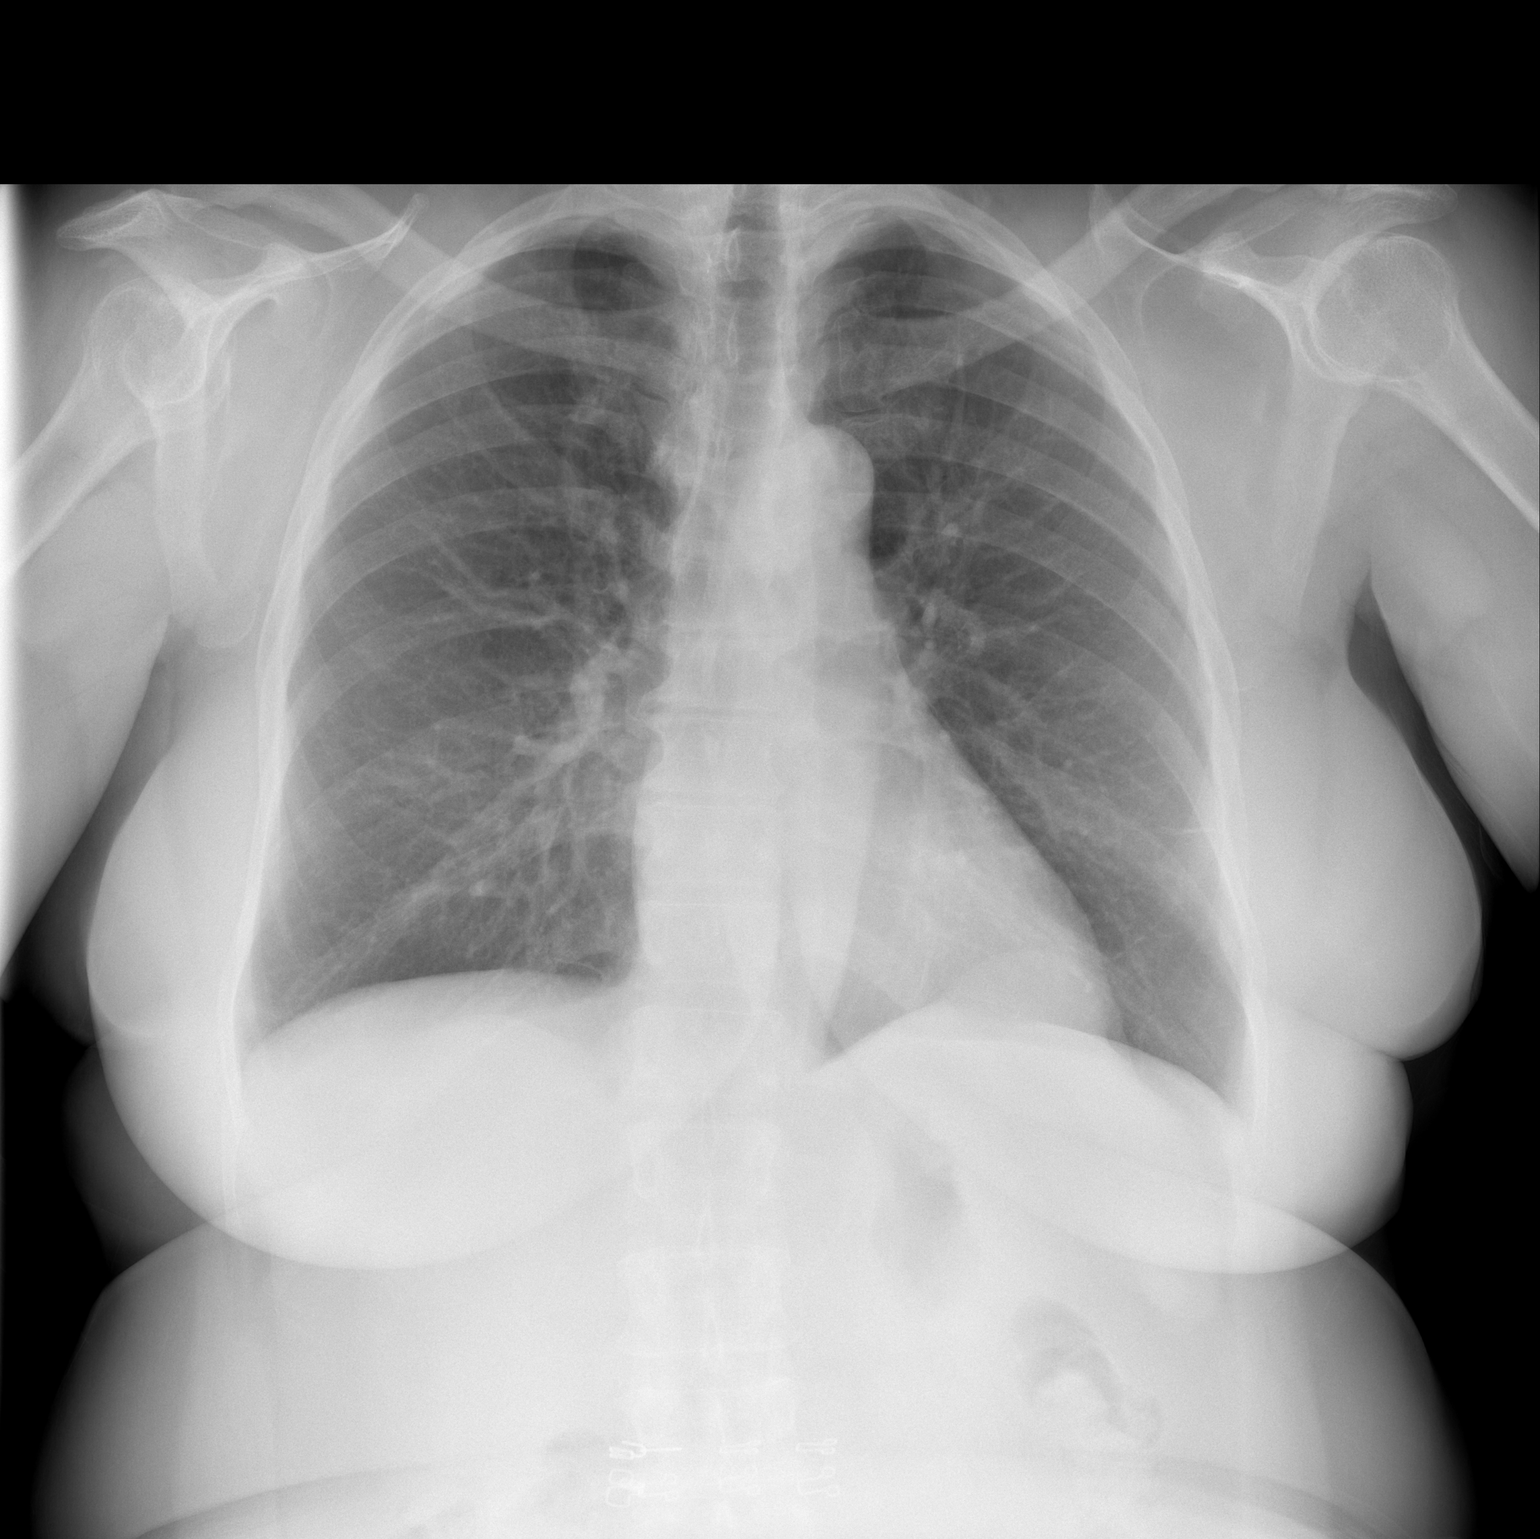

[w chest lat]
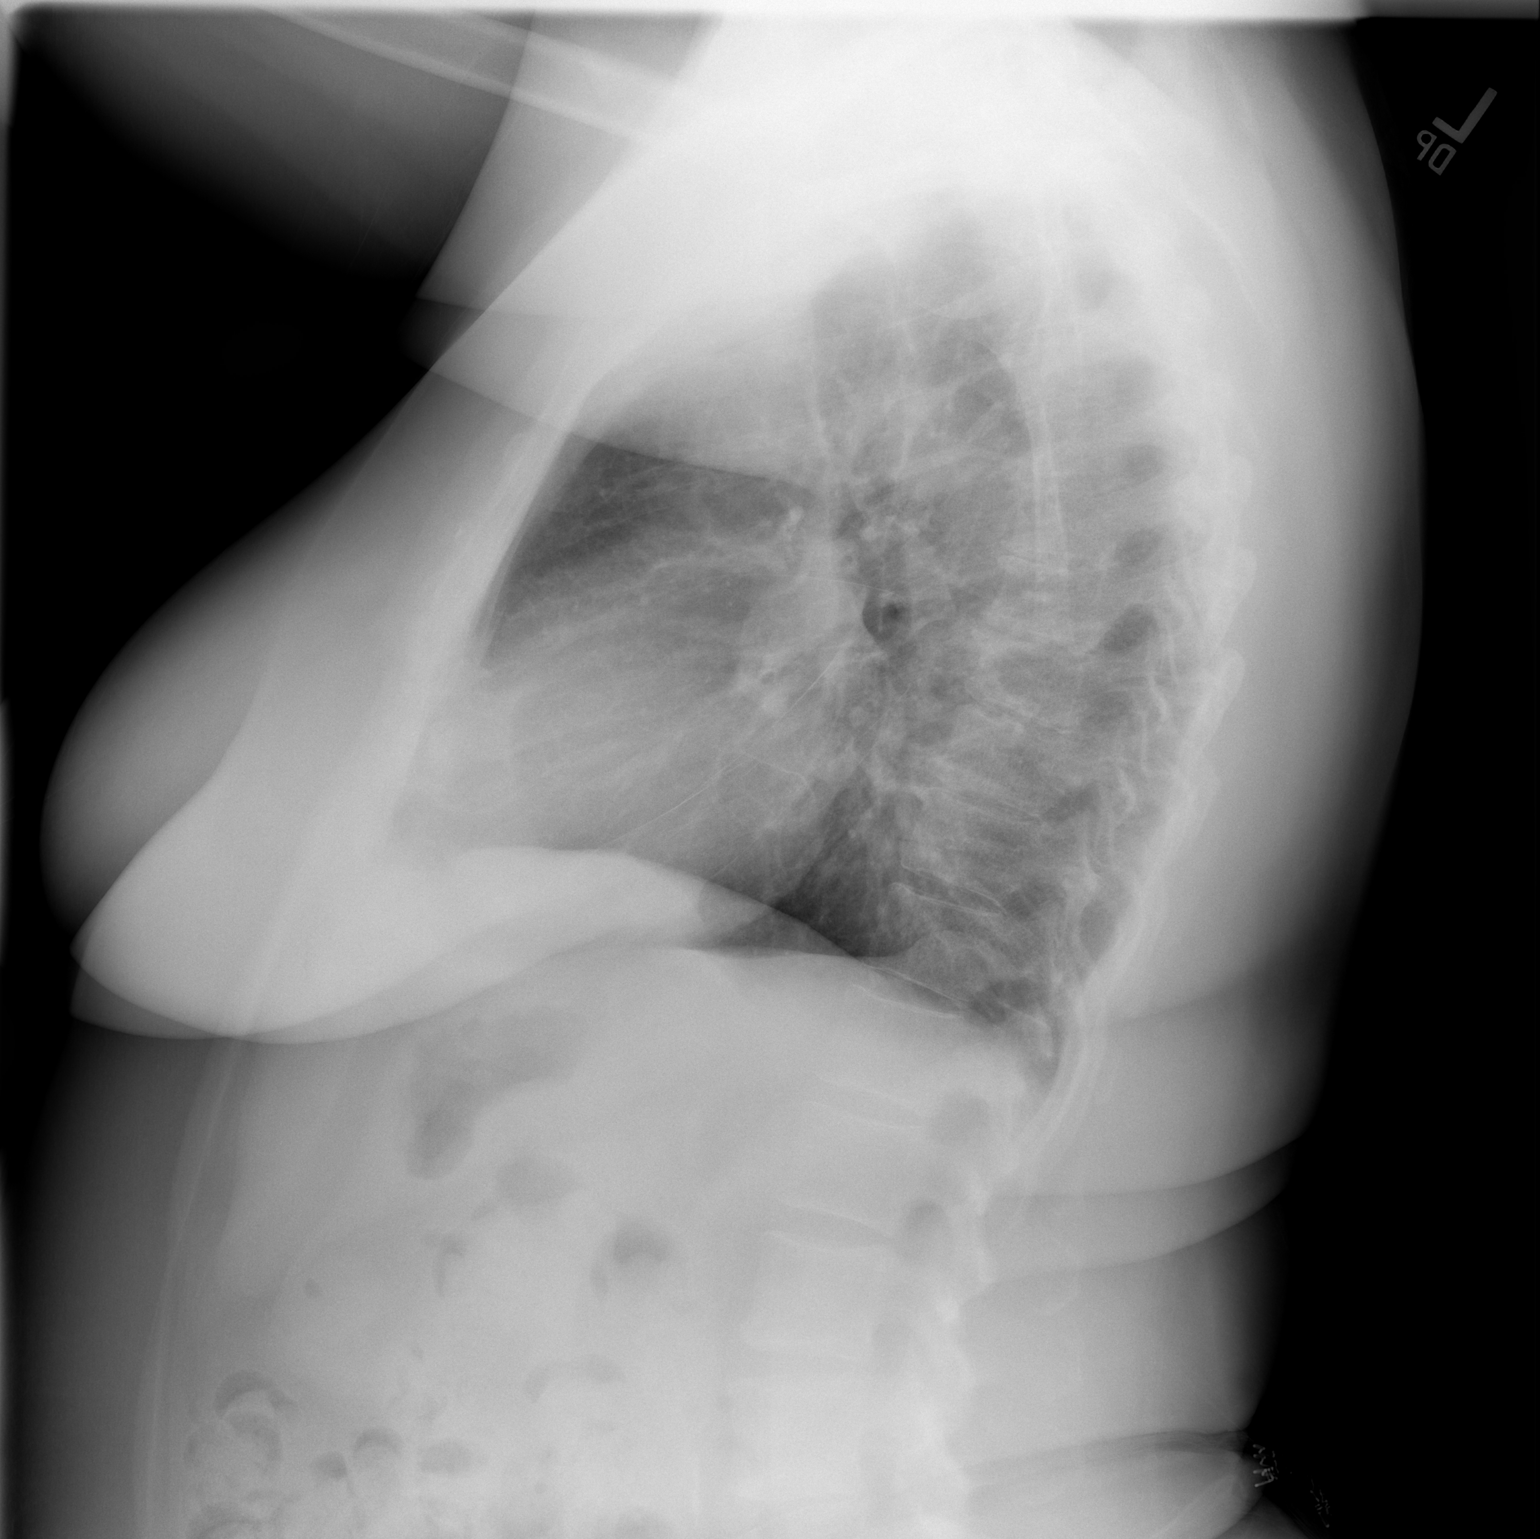

[2 of 2 positions shown; findings below may reference images not displayed]

FINDINGS: Mild chronic eventration of the posterior aspect of the left
hemidiaphragm is unchanged. Lung volumes are normal. No
consolidative airspace disease. No pleural effusions. No
pneumothorax. No pulmonary nodule or mass noted. Pulmonary
vasculature and the cardiomediastinal silhouette are within normal
limits.
IMPRESSION: 1.  No radiographic evidence of acute cardiopulmonary disease.

## 2023-02-16 ENCOUNTER — Other Ambulatory Visit: Payer: Self-pay

## 2023-02-16 ENCOUNTER — Emergency Department: Payer: BLUE CROSS/BLUE SHIELD

## 2023-02-16 ENCOUNTER — Encounter: Payer: Self-pay | Admitting: Emergency Medicine

## 2023-02-16 ENCOUNTER — Ambulatory Visit: Payer: BLUE CROSS/BLUE SHIELD | Admitting: Nurse Practitioner

## 2023-02-16 ENCOUNTER — Emergency Department
Admission: EM | Admit: 2023-02-16 | Discharge: 2023-02-17 | Disposition: A | Discharge status: Home / Self Care | Payer: BLUE CROSS/BLUE SHIELD | Attending: Emergency Medicine | Admitting: Emergency Medicine

## 2023-02-16 ENCOUNTER — Telehealth: Payer: Self-pay | Admitting: Internal Medicine

## 2023-02-16 DIAGNOSIS — I10 Essential (primary) hypertension: Secondary | ICD-10-CM | POA: Diagnosis not present

## 2023-02-16 DIAGNOSIS — R0602 Shortness of breath: Secondary | ICD-10-CM | POA: Diagnosis not present

## 2023-02-16 LAB — CBC WITH DIFFERENTIAL/PLATELET
Abs Immature Granulocytes: 0.01 10*3/uL (ref 0.00–0.07)
Basophils Absolute: 0 10*3/uL (ref 0.0–0.1)
Basophils Relative: 0 %
Eosinophils Absolute: 0.1 10*3/uL (ref 0.0–0.5)
Eosinophils Relative: 2 %
HCT: 40.4 % (ref 36.0–46.0)
Hemoglobin: 13 g/dL (ref 12.0–15.0)
Immature Granulocytes: 0 %
Lymphocytes Relative: 45 %
Lymphs Abs: 3.1 10*3/uL (ref 0.7–4.0)
MCH: 24 pg — ABNORMAL LOW (ref 26.0–34.0)
MCHC: 32.2 g/dL (ref 30.0–36.0)
MCV: 74.7 fL — ABNORMAL LOW (ref 80.0–100.0)
Monocytes Absolute: 0.7 10*3/uL (ref 0.1–1.0)
Monocytes Relative: 10 %
Neutro Abs: 3 10*3/uL (ref 1.7–7.7)
Neutrophils Relative %: 43 %
Platelets: 321 10*3/uL (ref 150–400)
RBC: 5.41 MIL/uL — ABNORMAL HIGH (ref 3.87–5.11)
RDW: 15.6 % — ABNORMAL HIGH (ref 11.5–15.5)
WBC: 6.9 10*3/uL (ref 4.0–10.5)
nRBC: 0 % (ref 0.0–0.2)

## 2023-02-16 LAB — COMPREHENSIVE METABOLIC PANEL
ALT: 25 U/L (ref 0–44)
AST: 28 U/L (ref 15–41)
Albumin: 3.8 g/dL (ref 3.5–5.0)
Alkaline Phosphatase: 73 U/L (ref 38–126)
Anion gap: 9 (ref 5–15)
BUN: 13 mg/dL (ref 8–23)
CO2: 25 mmol/L (ref 22–32)
Calcium: 7.8 mg/dL — ABNORMAL LOW (ref 8.9–10.3)
Chloride: 104 mmol/L (ref 98–111)
Creatinine, Ser: 1 mg/dL (ref 0.44–1.00)
GFR, Estimated: >60 mL/min (ref >60)
Glucose, Bld: 89 mg/dL (ref 70–99)
Potassium: 3.5 mmol/L (ref 3.5–5.1)
Sodium: 138 mmol/L (ref 135–145)
Total Bilirubin: 0.4 mg/dL (ref 0.3–1.2)
Total Protein: 7.5 g/dL (ref 6.5–8.1)

## 2023-02-16 LAB — BRAIN NATRIURETIC PEPTIDE: B Natriuretic Peptide: 75.7 pg/mL (ref 0.0–100.0)

## 2023-02-16 LAB — TROPONIN I (HIGH SENSITIVITY): Troponin I (High Sensitivity): 11 ng/L (ref <18)

## 2023-02-16 MED ORDER — CLONIDINE HCL 0.1 MG PO TABS
0.3000 mg | ORAL_TABLET | Freq: Once | ORAL | Status: AC
  Administered 2023-02-16: 0.3 mg via ORAL
  Filled 2023-02-16: qty 3

## 2023-02-16 MED ORDER — PREDNISONE 20 MG PO TABS: 40.0000 mg | ORAL_TABLET | Freq: Every day | ORAL | 0 refills | Status: AC

## 2023-02-16 MED ORDER — ALBUTEROL SULFATE (2.5 MG/3ML) 0.083% IN NEBU: 3.0000 mL | INHALATION_SOLUTION | RESPIRATORY_TRACT | Status: DC | PRN

## 2023-02-16 NOTE — Discharge Instructions (Addendum)
Take the prednisone daily for 5 days.  Use your albuterol up to every 4-6 hours as needed for shortness of breath.  Keep a log of your blood pressures at least once a day for the next 1 to 2 weeks.  Follow-up with your primary care doctor within the next several weeks.  In the meantime, return to the ER immediately for new, worsening, or persistent severe shortness of breath, chest pain, severe headache, weakness, or persistently elevated blood pressure readings especially over 200 on the top number or 120 on the bottom number.

## 2023-02-16 NOTE — ED Provider Notes (Signed)
[facility] Provider Note    Event Date/Time   First MD Initiated Contact with Patient 02/16/23 2210     (approximate)   History   Shortness of Breath   HPI  Elizabeth [name] is a 62 y.o. female with a history of hypertension and hyperthyroidism presents with shortness of breath for the last month, intermittent, mainly associated with exertion, and not exacerbated by lying flat.  The patient denies any paroxysmal nocturnal dyspnea.  She has no fever or chills.  She does have an occasional cough.  The patient denies any chest pain or leg swelling.  She has no headache.  She states that she takes losartan for blood pressure and when she last checked it at home a few weeks ago the systolic was in the 130s.  She states she has been compliant with her medication.    I reviewed the past medical records.  The patient has no recent ED visits or hospitalizations.  Her most recent outpatient encounter was with Dr. Newton [name] from primary care on 12/15 for follow-up of her chronic conditions.  At that time she had been on losartan.   Physical Exam   Triage Vital Signs: ED Triage Vitals  Enc Vitals Group     BP 02/16/23 1811 (!) 220/95     Pulse Rate 02/16/23 1811 69     Resp 02/16/23 1811 16     Temp 02/16/23 1811 98.6 F (37 C)     Temp Source 02/16/23 1811 Oral     SpO2 02/16/23 1811 96 %     Weight 02/16/23 1814 190 lb (86.2 kg)     Height 02/16/23 1814 5\' 5"  (1.651 m)     Head Circumference --      Peak Flow --      Pain Score 02/16/23 1814 0     Pain Loc --      Pain Edu? --      Excl. in GC? --     Most recent vital signs: Vitals:   02/16/23 2230 02/16/23 2300  BP: (!) 250/102 (!) 221/98  Pulse: 64 (!) 54  Resp: 17 12  Temp: 97.8 F (36.6 C)   SpO2: 100% 100%     General: Alert, comfortable appearing, no distress.  CV:  Good peripheral perfusion.  Normal heart Resp:  Normal effort.  Lungs CTAB. Abd:  No distention.  Other:  No peripheral edema.   No calf or popliteal swelling or tenderness.   ED Results / Procedures / Treatments   Labs (all labs ordered are listed, but only abnormal results are displayed) Labs Reviewed  COMPREHENSIVE METABOLIC PANEL - Abnormal; Notable for the following components:      Result Value   Calcium 7.8 (*)    All other components within normal limits  CBC WITH DIFFERENTIAL/PLATELET - Abnormal; Notable for the following components:   RBC 5.41 (*)    MCV 74.7 (*)    MCH 24.0 (*)    RDW 15.6 (*)    All other components within normal limits  BRAIN NATRIURETIC PEPTIDE  TROPONIN I (HIGH SENSITIVITY)     EKG  ED ECG REPORT I, [name], the attending physician, personally viewed and interpreted this ECG.  Date: 02/16/2023 EKG Time: 1817 Rate: 65 Rhythm: normal sinus rhythm QRS Axis: normal Intervals: normal ST/T Wave abnormalities: normal Narrative Interpretation: no evidence of acute ischemia    RADIOLOGY  Chest x-ray: I independently viewed and interpreted the images; there is no focal  consolidation or edema   PROCEDURES:  Critical Care performed: No  Procedures   MEDICATIONS ORDERED IN ED: Medications  cloNIDine (CATAPRES) tablet 0.3 mg (0.3 mg Oral Given 02/16/23 2246)     IMPRESSION / MDM / ASSESSMENT AND PLAN / ED COURSE  I reviewed the triage vital signs and the nursing notes.  62 year old female with PMH as noted above including history of hypertension but no known chronic lung conditions presents with intermittent mainly exertional shortness of breath over the last month.  She is also noted to be significantly hypertensive.  The patient states she is slightly anxious.  She reports she has been compliant with her blood pressure medication.  Physical exam is unremarkable for acute findings other than the blood pressure.  Differential diagnosis includes, but is not limited to:  Shortness of breath: COPD, CHF, bronchitis.  There is no evidence of PE given lack  of tachycardia or hypoxia and the intermittent nature of the symptoms.  The patient has no signs or symptoms of DVT.  Troponin is negative.  There is no indication for repeat based on the duration of the symptoms.  CBC shows no leukocytosis or significant anemia.  Chest x-ray shows no acute findings.  I will add on a BNP.  Hypertension: The patient states that her blood pressure is overall been relatively well-controlled and she thinks it is because she is somewhat anxious in the ED.  She is currently asymptomatic.  There is no evidence of hypertensive emergency.  Creatinine is normal.  EKG is nonischemic.  We will give p.o. clonidine.  Patient's presentation is most consistent with acute complicated illness / injury requiring diagnostic workup.  The patient is on the cardiac monitor to evaluate for evidence of arrhythmia and/or significant heart rate changes.  ----------------------------------------- 11:46 PM on 02/16/2023 -----------------------------------------  Blood pressure is improving, with the most recent being 201/93.  The patient remains asymptomatic.  BNP is normal.  Overall I suspect bronchitis.  The patient states she has been taking albuterol intermittently at home with improvement in her symptoms.  I will give a 5-day course of prednisone and have instructed her to continue the albuterol.  At this time there is no indication for additional blood pressure medication.  I instructed the patient to continue her losartan, keep a log of her blood pressures, and follow-up with her primary care provider.  Return precautions given, and she expresses understanding.   FINAL CLINICAL IMPRESSION(S) / ED DIAGNOSES   Final diagnoses:  Shortness of breath  Hypertension, unspecified type     Rx / DC Orders   ED Discharge Orders          Ordered    predniSONE (DELTASONE) 20 MG tablet  Daily with breakfast        02/16/23 2345             Note:  This document was prepared  using Dragon voice recognition software and may include unintentional dictation errors.    [name], MD 02/16/23 2348

## 2023-02-16 NOTE — Telephone Encounter (Signed)
Pt called stating she has been having shortness of breath for a month. Sent to access nurse

## 2023-02-16 NOTE — ED Triage Notes (Addendum)
Pt reports SOB x 1 month, somewhat better last week but then worse again over the past few days. Pt has noticed right posterior shoulder pain when lying down, intermittent. Denies cough/n/v/d/fever. HTN noted in triage BP 220/95; pt states she is compliant with prescribed HTN medications. Provider notified of pt vital signs.

## 2023-02-16 NOTE — Telephone Encounter (Signed)
FYI   Called and spoke with patient. SOB on exertion x1 month- worse last 2 days. Cannot walk up stairs in her house without stopping to rest. Discomfort in her chest at night when lying on her left side. Advised that given her symptoms, needs to go to ED asap for evaluation to confirm nothing acute. Pt stated she cannot go right now because she has her grandkids and would try to get over there today and tomorrow. Advised that she does not need to wait until tomorrow and needs to go asap today. Pt gave verbal understanding.

## 2023-02-16 NOTE — ED Provider Triage Note (Signed)
Emergency Medicine Provider Triage Evaluation Note  Elizabeth [name] , a 62 y.o. female  was evaluated in triage.  Pt complains of shortness of breath times a month plus hypertension.  Patient has some intermittent pain that is described as her back/shoulder region especially when lying down.  Does not appear to be pleuritic in nature.  No substernal chest pain.  No URI symptoms, fevers, chills..  Review of Systems  Positive: Hypertensive, short of breath, intermittent back pain Negative: Chest pain, abdominal pain, cough, URI symptoms  Physical Exam  BP (!) 220/95 (BP Location: Left Arm)   Pulse 69   Temp 98.6 F (37 C) (Oral)   Resp 16   Ht 5\' 5"  (1.651 m)   Wt 86.2 kg   SpO2 96%   BMI 31.62 kg/m  Gen:   Awake, no distress   Resp:  Normal effort  MSK:   Moves extremities without difficulty  Other:    Medical Decision Making  Medically screening exam initiated at 6:23 PM.  Appropriate orders placed.  Elizabeth [name] was informed that the remainder of the evaluation will be completed by another provider, this initial triage assessment does not replace that evaluation, and the importance of remaining in the ED until their evaluation is complete.  Patient will have labs, chest x-ray, EKG.   Elizabeth Patches, PA-C 02/16/23 1823

## 2023-02-16 NOTE — Telephone Encounter (Signed)
Access nurse called back with a 4 hour disposition . I spoke to [place], NP. She stated that the patient needs to be seen in the ED. I will transfer the patient to [facility] LPN to further triage the patient.

## 2023-02-16 NOTE — Telephone Encounter (Signed)
Noted  

## 2023-02-16 NOTE — ED Notes (Signed)
Dr. Marisa Severin notified of patient's elevated BP of 250/102. Awaiting orders.

## 2023-02-22 ENCOUNTER — Telehealth: Payer: Self-pay | Admitting: *Deleted

## 2023-02-22 NOTE — Transitions of Care (Post Inpatient/ED Visit) (Signed)
   02/22/2023  Name: Elizabeth [name] MRN: [id number] DOB: 04/06/1961  Today's TOC FU Call Status: Today's TOC FU Call Status:: Unsuccessul Call (1st Attempt) Unsuccessful Call (1st Attempt) Date: 02/22/23  Attempted to reach the patient regarding the most recent Inpatient/ED visit.  Follow Up Plan: Additional outreach attempts will be made to reach the patient to complete the Transitions of Care (Post Inpatient/ED visit) call.   Gean Maidens BSN RN [facility] [phone]

## 2023-02-23 ENCOUNTER — Telehealth: Payer: Self-pay | Admitting: *Deleted

## 2023-02-23 NOTE — Transitions of Care (Post Inpatient/ED Visit) (Signed)
02/23/2023  Name: Elizabeth [name] MRN: [id number] DOB: Jul 22, 1961  Today's TOC FU Call Status: Today's TOC FU Call Status:: Successful TOC FU Call Competed TOC FU Call Complete Date: 02/23/23  Transition Care Management Follow-up Telephone Call Date of Discharge: 02/17/23 Discharge Facility: [facility]) Type of Discharge: Emergency Department Primary Inpatient Discharge Diagnosis:: Shortness of Breath Reason for ED Visit: Other: (Shortness of breath, Hypertension) How have you been since you were released from the hospital?: Better Any questions or concerns?: No  Items Reviewed: Did you receive and understand the discharge instructions provided?: Yes Medications obtained,verified, and reconciled?: Yes (Medications Reviewed) Any new allergies since your discharge?: No Dietary orders reviewed?: No Do you have support at home?: Yes People in Home: spouse Name of Support/Comfort Primary Source: [name]  Medications Reviewed Today: Medications Reviewed Today     Reviewed by Luella Cook, RN (Case Manager) on 02/23/23 at 1012  Med List Status: <None>   Medication Order Taking? Sig Documenting Provider Last Dose Status Informant  cetirizine (ZYRTEC) 10 MG tablet [id number] Yes Take 1 tablet (10 mg total) by mouth daily. Flinchum, Eula Fried, FNP Taking Active   cyclobenzaprine (FLEXERIL) 5 MG tablet [id number] Yes Take 1 tablet (5 mg total) by mouth 3 (three) times daily as needed for muscle spasms. Sherlene Shams, MD Taking Active   diazepam (VALIUM) 5 MG tablet [id number] Yes Take 1 tablet (5 mg total) by mouth as needed for anxiety or sedation (MRI procedure). Take one hour prior to procedure; may repeat if needed Sherlene Shams, MD Taking Active   fluticasone (FLONASE) 50 MCG/ACT nasal spray [id number] Yes Place 1 spray into both nostrils 2 (two) times daily as needed for rhinitis or allergies (allergies). Flinchum, Eula Fried, FNP Taking Active    gabapentin (NEURONTIN) 300 MG capsule [id number] Yes Take 1 capsule (300 mg total) by mouth daily as needed (neurpoathy). Sherlene Shams, MD Taking Active   losartan (COZAAR) 100 MG tablet [id number] Yes TAKE 1 TABLET BY MOUTH EVERY DAY Sherlene Shams, MD Taking Active   propranolol (INDERAL) 20 MG tablet [id number] Yes TAKE 1 TABLET BY MOUTH THREE TIMES A DAY Sherlene Shams, MD Taking Active   propylthiouracil (PTU) 50 MG tablet [id number] Yes Take 1 tablet (50 mg total) by mouth daily. Shamleffer, Konrad Dolores, MD Taking Active   Ruxolitinib Phosphate (OPZELURA) 1.5 % CREA [id number] Yes Apply 1 application topically daily. Apply to rash QD until clear then PRN. Deirdre Evener, MD Taking Active   sertraline (ZOLOFT) 50 MG tablet [id number] Yes Take 1 tablet (50 mg total) by mouth daily after breakfast. Sherlene Shams, MD Taking Active   spironolactone (ALDACTONE) 50 MG tablet [id number] Yes TAKE 1 TABLET BY MOUTH EVERY DAY Sherlene Shams, MD Taking Active   triamcinolone cream (KENALOG) 0.1 % [id number] Yes Apply 1 application topically 2 (two) times daily. Sherlene Shams, MD Taking Active             Home Care and Equipment/Supplies: Were Home Health Services Ordered?: NA Any new equipment or medical supplies ordered?: NA  Functional Questionnaire: Do you need assistance with bathing/showering or dressing?: No Do you need assistance with meal preparation?: No Do you need assistance with eating?: No Do you have difficulty maintaining continence: No Do you need assistance with getting out of bed/getting out of a chair/moving?: No Do you have difficulty managing or taking your medications?: No  Follow up appointments reviewed: PCP Follow-up appointment confirmed?:  Yes Date of PCP follow-up appointment?: 02/28/23 Follow-up Provider: [facility] Follow-up appointment confirmed?: NA Do you need transportation to your follow-up appointment?: No Do you understand  care options if your condition(s) worsen?: Yes-patient verbalized understanding  SDOH Interventions Today    Flowsheet Row Most Recent Value  SDOH Interventions   Food Insecurity Interventions Intervention Not Indicated  Housing Interventions Intervention Not Indicated  Transportation Interventions Intervention Not Indicated      Interventions Today    Flowsheet Row Most Recent Value  General Interventions   General Interventions Discussed/Reviewed General Interventions Discussed, General Interventions Reviewed, Doctor Visits, Referral to Nurse  [facility] to Care Coordinator George Ina for [id number] 2:00 appt]  Doctor Visits Discussed/Reviewed Doctor Visits Discussed, Doctor Visits Reviewed  Pharmacy Interventions   Pharmacy Dicussed/Reviewed Pharmacy Topics Discussed      TOC Interventions Today    Flowsheet Row Most Recent Value  TOC Interventions   TOC Interventions Discussed/Reviewed TOC Interventions Discussed, TOC Interventions Reviewed, Arranged PCP follow up less than 12 days/Care Guide scheduled       Gean Maidens BSN RN [facility] [phone]

## 2023-02-28 ENCOUNTER — Encounter: Payer: Self-pay | Admitting: Internal Medicine

## 2023-02-28 ENCOUNTER — Ambulatory Visit: Payer: BLUE CROSS/BLUE SHIELD | Admitting: Internal Medicine

## 2023-02-28 DIAGNOSIS — E559 Vitamin D deficiency, unspecified: Secondary | ICD-10-CM

## 2023-02-28 DIAGNOSIS — R718 Other abnormality of red blood cells: Secondary | ICD-10-CM | POA: Diagnosis not present

## 2023-02-28 DIAGNOSIS — E213 Hyperparathyroidism, unspecified: Secondary | ICD-10-CM

## 2023-02-28 DIAGNOSIS — R0982 Postnasal drip: Secondary | ICD-10-CM

## 2023-02-28 DIAGNOSIS — I10 Essential (primary) hypertension: Secondary | ICD-10-CM | POA: Diagnosis not present

## 2023-02-28 DIAGNOSIS — E0590 Thyrotoxicosis, unspecified without thyrotoxic crisis or storm: Secondary | ICD-10-CM | POA: Diagnosis not present

## 2023-02-28 DIAGNOSIS — R0609 Other forms of dyspnea: Secondary | ICD-10-CM

## 2023-02-28 MED ORDER — LOSARTAN POTASSIUM 100 MG PO TABS
100.0000 mg | ORAL_TABLET | Freq: Every day | ORAL | 3 refills | Status: DC
Start: 2023-02-28 — End: 2023-03-29

## 2023-02-28 MED ORDER — CLONIDINE HCL 0.1 MG PO TABS
0.1000 mg | ORAL_TABLET | Freq: Two times a day (BID) | ORAL | 1 refills | Status: DC
Start: 2023-02-28 — End: 2023-02-28

## 2023-02-28 MED ORDER — FLUTICASONE PROPIONATE 50 MCG/ACT NA SUSP
1.0000 | Freq: Two times a day (BID) | NASAL | 1 refills | Status: AC | PRN
Start: 2023-02-28 — End: ?

## 2023-02-28 MED ORDER — SERTRALINE HCL 50 MG PO TABS: 50.0000 mg | ORAL_TABLET | Freq: Every day | ORAL | 3 refills | Status: DC

## 2023-02-28 MED ORDER — PROPRANOLOL HCL 20 MG PO TABS: ORAL_TABLET | ORAL | 3 refills | Status: DC

## 2023-02-28 MED ORDER — FLUTICASONE PROPIONATE 50 MCG/ACT NA SUSP
1.0000 | Freq: Two times a day (BID) | NASAL | 1 refills | Status: DC | PRN
Start: 2023-02-28 — End: 2023-02-28

## 2023-02-28 MED ORDER — LOSARTAN POTASSIUM 100 MG PO TABS: 100.0000 mg | ORAL_TABLET | Freq: Every day | ORAL | 3 refills | Status: DC

## 2023-02-28 MED ORDER — CLONIDINE HCL 0.1 MG PO TABS: 0.1000 mg | ORAL_TABLET | Freq: Two times a day (BID) | ORAL | 1 refills | Status: DC

## 2023-02-28 NOTE — Patient Instructions (Addendum)
[employer] makes the best blood pressure machines . PLEASE OBTAIN  ONE ASAP  FROM [employer]  ONCE YOU HAVE IT  ,   STOP Taking THE PROPANOLOL,  AND CHECK YOUR PULSE AND Blood pressure  EVERY 8 HOURS USING YOUR [employer] machine AND SEND ME the READINGS   The PTU IS FOR YOUR THYROID   CARDIOLOGY REFERRAL IN PROGRESS TO EVALUATE YOUR SHORTNESS OF BREATH  YOUR MUSCLE CRAMPS MAY BE DUE TO LOW CALCIUM LEVELS.  I AM RECHECKING TODAY

## 2023-02-28 NOTE — Progress Notes (Signed)
Subjective:  Patient ID: Elizabeth [name], female    DOB: [birth year]  Age: 62 y.o. MRN: [id number]  CC: The primary encounter diagnosis was Hypocalcemia. Diagnoses of Post-nasal drip, Hypertension, unspecified type, Microcytosis, Hyperthyroidism, Hyperparathyroidism (HCC), Exertional dyspnea, and Essential hypertension were also pertinent to this visit.   HPI  base  Elizabeth [name] presents for  Chief Complaint  Patient presents with   Hospitalization Follow-up    Treated in ER on June 12 for   1) dyspnea with exertion  x 36 weeks  .  BNP, Troponin and Chest xray no acute changes.  Some scarring noted in LL base (vs atx) that was seen previously and considered stable.  Given prednisone and advised to continue use of albuterol .  Marland Kitchen  Continues to feel dyspnea with any exertion. Denies chest /jaw pain.   Has not exercised for years, but  started a walking program 4  weeks ago.     History of tobacco abuse from age 40 to age 58 (stopped)  no history of asthma or COPD.  She notes "strangling" when she eats food.  Not daily, chronic, was tested in 2017 with a swallow evaluation and nothing found.     But at night has been having pain on her left side and and midback on the right , during the day. ,   several days ago developed diffuse muscle cramping in the muscles on her back .  Has also had recurrent muscle spasm on the left side of her neck , up into her head,  and both feet.  Taking magnesium  (unclear amt) combined with calcium citrate 250 mg 4 times daily (1000 mg  total)   2)  Hypertensive urgency  given clonidine  0.3 mg for BP for systolic 220  repeat reading 201 systolic, discharged home no changes to meds. Home readings  on daughter's machine have been  142/88.  Does not own .  Taking propanol once daily for heart palpitations   3) Hyperthyroid:  taking PTU once daily   3)  feeling excessively tired . Has not been sleeping well , tosses and turns due to diffuse pain .  Has tried taking  tylenol and gabapentin which have not helped.   No prior pulmonology or cardiology evaluations .  Recalls a  ("routine") treadmill stress test in 2018 in [place]. Does not remember the results .    Outpatient Medications Prior to Visit  Medication Sig Dispense Refill   cetirizine (ZYRTEC) 10 MG tablet Take 1 tablet (10 mg total) by mouth daily. 30 tablet 11   cyclobenzaprine (FLEXERIL) 5 MG tablet Take 1 tablet (5 mg total) by mouth 3 (three) times daily as needed for muscle spasms. 90 tablet 2   gabapentin (NEURONTIN) 300 MG capsule Take 1 capsule (300 mg total) by mouth daily as needed (neurpoathy). 30 capsule 2   propylthiouracil (PTU) 50 MG tablet Take 1 tablet (50 mg total) by mouth daily. 90 tablet 3   Ruxolitinib Phosphate (OPZELURA) 1.5 % CREA Apply 1 application topically daily. Apply to rash QD until clear then PRN. 60 g 1   triamcinolone cream (KENALOG) 0.1 % Apply 1 application topically 2 (two) times daily. 30 g 0   fluticasone (FLONASE) 50 MCG/ACT nasal spray Place 1 spray into both nostrils 2 (two) times daily as needed for rhinitis or allergies (allergies). 16 mL 1   losartan (COZAAR) 100 MG tablet TAKE 1 TABLET BY MOUTH EVERY DAY 90 tablet 3   propranolol (  INDERAL) 20 MG tablet TAKE 1 TABLET BY MOUTH THREE TIMES A DAY 270 tablet 1   sertraline (ZOLOFT) 50 MG tablet Take 1 tablet (50 mg total) by mouth daily after breakfast. 90 tablet 1   spironolactone (ALDACTONE) 50 MG tablet TAKE 1 TABLET BY MOUTH EVERY DAY (Patient not taking: Reported on 02/28/2023) 90 tablet 1   diazepam (VALIUM) 5 MG tablet Take 1 tablet (5 mg total) by mouth as needed for anxiety or sedation (MRI procedure). Take one hour prior to procedure; may repeat if needed (Patient not taking: Reported on 02/28/2023) 2 tablet 0   No facility-administered medications prior to visit.    Review of Systems;  Patient denies headache, fevers, malaise, unintentional weight loss, skin rash, eye pain, sinus congestion  and sinus pain, sore throat, dysphagia,  hemoptysis , cough, dyspnea, wheezing, chest pain, palpitations, orthopnea, edema, abdominal pain, nausea, melena, diarrhea, constipation, flank pain, dysuria, hematuria, urinary  Frequency, nocturia, numbness, tingling, seizures,  Focal weakness, Loss of consciousness,  Tremor, insomnia, depression, anxiety, and suicidal ideation.      Objective:  BP (!) 144/86   Pulse 66   Temp 98.6 F (37 C) (Oral)   Ht 5\' 5"  (1.651 m)   Wt 187 lb 6.4 oz (85 kg)   SpO2 98%   BMI 31.18 kg/m   BP Readings from Last 3 Encounters:  02/28/23 (!) 144/86  02/17/23 (!) 147/80  08/20/22 (!) 150/84    Wt Readings from Last 3 Encounters:  02/28/23 187 lb 6.4 oz (85 kg)  02/16/23 190 lb (86.2 kg)  08/20/22 195 lb (88.5 kg)    Physical Exam Vitals reviewed.  Constitutional:      General: She is not in acute distress.    Appearance: Normal appearance. She is normal weight. She is not ill-appearing, toxic-appearing or diaphoretic.  HENT:     Head: Normocephalic.  Eyes:     General: No scleral icterus.       Right eye: No discharge.        Left eye: No discharge.     Conjunctiva/sclera: Conjunctivae normal.  Cardiovascular:     Rate and Rhythm: Normal rate and regular rhythm.     Heart sounds: Normal heart sounds.  Pulmonary:     Effort: Pulmonary effort is normal. No respiratory distress.     Breath sounds: Normal breath sounds.  Musculoskeletal:        General: Normal range of motion.  Skin:    General: Skin is warm and dry.  Neurological:     General: No focal deficit present.     Mental Status: She is alert and oriented to person, place, and time. Mental status is at baseline.  Psychiatric:        Mood and Affect: Mood normal.        Behavior: Behavior normal.        Thought Content: Thought content normal.        Judgment: Judgment normal.     Lab Results  Component Value Date   HGBA1C 6.3 08/20/2022   HGBA1C 5.7 08/23/2019   HGBA1C 5.8  01/23/2019    Lab Results  Component Value Date   CREATININE 1.00 02/16/2023   CREATININE 1.04 08/20/2022   CREATININE 1.01 08/25/2021    Lab Results  Component Value Date   WBC 6.9 02/16/2023   HGB 13.0 02/16/2023   HCT 40.4 02/16/2023   PLT 321 02/16/2023   GLUCOSE 89 02/16/2023   CHOL 238 (H) 02/27/2019  TRIG 125.0 02/27/2019   HDL 54.90 02/27/2019   LDLCALC 158 (H) 02/27/2019   ALT 25 02/16/2023   AST 28 02/16/2023   NA 138 02/16/2023   K 3.5 02/16/2023   CL 104 02/16/2023   CREATININE 1.00 02/16/2023   BUN 13 02/16/2023   CO2 25 02/16/2023   TSH 3.17 05/13/2022   HGBA1C 6.3 08/20/2022   MICROALBUR <0.7 08/20/2022    DG Chest 2 View  Result Date: 02/16/2023 CLINICAL DATA:  Shortness of breath x1 month. EXAM: CHEST - 2 VIEW COMPARISON:  November 10, 2021 FINDINGS: The heart size and mediastinal contours are within normal limits. Mild, stable linear scarring and/or atelectasis is seen along the periphery of the left lung base. There is no evidence of acute infiltrate, pleural effusion or pneumothorax. The visualized skeletal structures are unremarkable. IMPRESSION: No active cardiopulmonary disease. Electronically Signed   By: Aram Candela M.D.   On: 02/16/2023 18:35    Assessment & Plan:  .Hypocalcemia -     Calcium, ionized -     Magnesium  Post-nasal drip -     Fluticasone Propionate; Place 1 spray into both nostrils 2 (two) times daily as needed for rhinitis or allergies (allergies).  Dispense: 48 g; Refill: 1  Hypertension, unspecified type -     Ambulatory referral to Cardiology -     Losartan Potassium; Take 1 tablet (100 mg total) by mouth daily.  Dispense: 90 tablet; Refill: 3  Microcytosis -     IBC + Ferritin  Hyperthyroidism Assessment & Plan: Secondary to Grave's disease per Endocrinology, managed with  PTU and propranolol.  TSH is due for repeat assessment   Orders: -     TSH  Hyperparathyroidism (HCC) Assessment & Plan: S/p  parathyroidectomy .  Current symptoms of muscle spasms suggest hypocalcemia  secondary to hypoparathyroidism.  If recurrent calcium is low  will add calcitriol   Orders: -     VITAMIN D 25 Hydroxy (Vit-D Deficiency, Fractures)  Exertional dyspnea Assessment & Plan: Resent for several weeks , with no evidence or history of lung disease.  Referring to cardiology for evaluatoin    Essential hypertension Assessment & Plan: Reading is elevated today  andat home .  Adding clonidine .  Continue spironolactone given diagnosis of hyperaldosteronism   Other orders -     cloNIDine HCl; Take 1 tablet (0.1 mg total) by mouth 2 (two) times daily.  Dispense: 180 tablet; Refill: 1 -     Propranolol HCl; TAKE 1 TABLET BY MOUTH THREE TIMES A DAY  Dispense: 867.857 tablet; Refill: 3 -     Sertraline HCl; Take 1 tablet (50 mg total) by mouth daily after breakfast.  Dispense: 90 tablet; Refill: 3     I provided  42 minutes of face-to-face time during this encounter reviewing patient's last visit with me, patient's  most recent  ER visit, including  EKG, labs and imaging studies,  recent surgical and non surgical procedures, counseling on currently addressed issues,  and post visit ordering to diagnostics and therapeutics .   Follow-up: No follow-ups on file.   Sherlene Shams, MD

## 2023-02-28 NOTE — Assessment & Plan Note (Signed)
Resent for several weeks , with no evidence or history of lung disease.  Referring to cardiology for evaluatoin

## 2023-02-28 NOTE — Assessment & Plan Note (Signed)
Secondary to Grave's disease per Endocrinology, managed with  PTU and propranolol.  TSH is due for repeat assessment

## 2023-02-28 NOTE — Assessment & Plan Note (Signed)
Reading is elevated today  andat home .  Adding clonidine .  Continue spironolactone given diagnosis of hyperaldosteronism

## 2023-02-28 NOTE — Assessment & Plan Note (Addendum)
S/p parathyroidectomy .  Current symptoms of muscle spasms suggest hypocalcemia  secondary to hypoparathyroidism. However her ionzed calcium and her mg levels are normal.  Will treat low D

## 2023-03-01 DIAGNOSIS — E559 Vitamin D deficiency, unspecified: Secondary | ICD-10-CM | POA: Insufficient documentation

## 2023-03-01 LAB — VITAMIN D 25 HYDROXY (VIT D DEFICIENCY, FRACTURES): VITD: 16.96 ng/mL — ABNORMAL LOW (ref 30.00–100.00)

## 2023-03-01 LAB — TSH: TSH: 2.5 u[IU]/mL (ref 0.35–5.50)

## 2023-03-01 LAB — CALCIUM, IONIZED: Calcium, Ion: 5 mg/dL (ref 4.7–5.5)

## 2023-03-01 LAB — IBC + FERRITIN
Ferritin: 75.9 ng/mL (ref 10.0–291.0)
Iron: 58 ug/dL (ref 42–145)
Saturation Ratios: 19.8 % — ABNORMAL LOW (ref 20.0–50.0)
TIBC: 292.6 ug/dL (ref 250.0–450.0)
Transferrin: 209 mg/dL — ABNORMAL LOW (ref 212.0–360.0)

## 2023-03-01 LAB — MAGNESIUM: Magnesium: 2 mg/dL (ref 1.5–2.5)

## 2023-03-01 MED ORDER — ERGOCALCIFEROL 1.25 MG (50000 UT) PO CAPS: 50000.0000 [IU] | ORAL_CAPSULE | ORAL | 0 refills | Status: DC

## 2023-03-01 NOTE — Assessment & Plan Note (Signed)
Treating with weekly Drisdol x 12 weeks.  Ionized calcium normal,  mg normal

## 2023-03-01 NOTE — Addendum Note (Signed)
Addended by: Sherlene Shams on: 03/01/2023 12:52 PM   Modules accepted: Orders

## 2023-03-07 ENCOUNTER — Ambulatory Visit: Payer: Self-pay

## 2023-03-07 NOTE — Patient Outreach (Signed)
  Care Coordination   Initial Visit Note   03/07/2023 Name: Elizabeth [name] MRN: [id number] DOB: Jun 14, 1961  Elizabeth [name] is a 62 y.o. year old female who sees Darrick Huntsman, Mar Daring, MD for primary care. I spoke with  Lowella Curb by phone today.  What matters to the patients health and wellness today?   HTN:  Patient states her blood pressure has been up and down since ED visit on 02/16/23.  She states her doctor added clonidine to her blood pressure management regimen.  She reports monitoring her blood pressure at least 2 x per day and recording. She reports today's blood pressure was 158/88.  She states her blood pressure is also ranging in the 140's/ 80's.  SOB:  Patient reports ED visit on 02/16/23 due to SOB.  States she is unsure what the SOB was from. She states her provider gave her prednisone.   Anxiety:  Patient states she feels a little overwhelmed due to her blood pressure issues.  PHQ2 performed score: 1.  Patient declined need for social worker referral at this time.     Goals Addressed             This Visit's Progress    management / education of health conditions       Interventions Today    Flowsheet Row Most Recent Value  Chronic Disease   Chronic disease during today's visit Hypertension (HTN), Other  [anxiety, shortness of breath]  General Interventions   General Interventions Discussed/Reviewed General Interventions Discussed, Doctor Visits  [evaluation of current treatment plan related to HTN, anxiety, SOB and patients adherence to plan as established by provider]  Doctor Visits Discussed/Reviewed Doctor Visits Discussed  [reviewed upcoming provder vists.]  Exercise Interventions   Exercise Discussed/Reviewed Physical Activity  Education Interventions   Education Provided Provided Education, Provided Printed Education  [education information sent to patient in [place] on HTN.]  Provided Verbal Education On Other  [Advised to monitor blood pressures daily and record.   Encouraged not to monitor blood pressures to many times per day which may cause increase in anxiety.]  Mental Health Interventions   Mental Health Discussed/Reviewed Mental Health Discussed, Anxiety  [discussed/ offered social worker referral.   Completed PHQ 2]  Nutrition Interventions   Nutrition Discussed/Reviewed Nutrition Discussed, Decreasing salt  Pharmacy Interventions   Pharmacy Dicussed/Reviewed Pharmacy Topics Discussed  [medications reviewed and compliance advised. Advised to call pharmacy to determine whether ok to take losartan and clonidine at same time.]              SDOH assessments and interventions completed:  Yes  SDOH Interventions Today    Flowsheet Row Most Recent Value  SDOH Interventions   Food Insecurity Interventions Intervention Not Indicated  Housing Interventions Intervention Not Indicated  Transportation Interventions Intervention Not Indicated        Care Coordination Interventions:  Yes, provided   Follow up plan: Follow up call scheduled for 04/19/23    Encounter Outcome:  Pt. Visit Completed   George Ina RN,BSN,CCM [facility] Care Coordination [phone] direct line

## 2023-03-07 NOTE — Patient Instructions (Signed)
Visit Information  Thank you for taking time to visit with me today. Please don't hesitate to contact me if I can be of assistance to you.   Following are the goals we discussed today:   Goals Addressed             This Visit's Progress    management / education of health conditions       Interventions Today    Flowsheet Row Most Recent Value  Chronic Disease   Chronic disease during today's visit Hypertension (HTN), Other  [anxiety, shortness of breath]  General Interventions   General Interventions Discussed/Reviewed General Interventions Discussed, Doctor Visits  [evaluation of current treatment plan related to HTN, anxiety, SOB and patients adherence to plan as established by provider]  Doctor Visits Discussed/Reviewed Doctor Visits Discussed  [reviewed upcoming provder vists.]  Exercise Interventions   Exercise Discussed/Reviewed Physical Activity  Education Interventions   Education Provided Provided Education, Provided Printed Education  [education information sent to patient in [place] on HTN.]  Provided Verbal Education On Other  [Advised to monitor blood pressures daily and record.  Encouraged not to monitor blood pressures to many times per day which may cause increase in anxiety.]  Mental Health Interventions   Mental Health Discussed/Reviewed Mental Health Discussed, Anxiety  [discussed/ offered social worker referral.   Completed PHQ 2]  Nutrition Interventions   Nutrition Discussed/Reviewed Nutrition Discussed, Decreasing salt  Pharmacy Interventions   Pharmacy Dicussed/Reviewed Pharmacy Topics Discussed  [medications reviewed and compliance advised. Advised to call pharmacy to determine whether ok to take losartan and clonidine at same time.]              Our next appointment is by telephone on 04/19/23 at 2:30 pm  Please call the care guide team at [phone] if you need to cancel or reschedule your appointment.   If you are experiencing a Mental Health  or Behavioral Health Crisis or need someone to talk to, please call the Suicide and Crisis Lifeline: 988 call 1-800-273-TALK (toll free, 24 hour hotline)  Patient verbalizes understanding of instructions and care plan provided today and agrees to view in MyChart. Active MyChart status and patient understanding of how to access instructions and care plan via MyChart confirmed with patient.     George Ina RN,BSN,CCM [facility] Care Coordination [phone] direct line   Managing Your Hypertension Hypertension, also called high blood pressure, is when the force of the blood pressing against the walls of the arteries is too strong. Arteries are blood vessels that carry blood from your heart throughout your body. Hypertension forces the heart to work harder to pump blood and may cause the arteries to become narrow or stiff. Understanding blood pressure readings A blood pressure reading includes a higher number over a lower number: The first, or top, number is called the systolic pressure. It is a measure of the pressure in your arteries as your heart beats. The second, or bottom number, is called the diastolic pressure. It is a measure of the pressure in your arteries as the heart relaxes. For most people, a normal blood pressure is below 120/80. Your personal target blood pressure may vary depending on your medical conditions, your age, and other factors. Blood pressure is classified into four stages. Based on your blood pressure reading, your health care provider may use the following stages to determine what type of treatment you need, if any. Systolic pressure and diastolic pressure are measured in a unit called millimeters of mercury (mmHg). Normal  Systolic pressure: below 120. Diastolic pressure: below 80. Elevated Systolic pressure: 120-129. Diastolic pressure: below 80. Hypertension stage 1 Systolic pressure: 130-139. Diastolic pressure: 80-89. Hypertension stage 2 Systolic pressure: 140  or above. Diastolic pressure: 90 or above. How can this condition affect me? Managing your hypertension is very important. Over time, hypertension can damage the arteries and decrease blood flow to parts of the body, including the brain, heart, and kidneys. Having untreated or uncontrolled hypertension can lead to: A heart attack. A stroke. A weakened blood vessel (aneurysm). Heart failure. Kidney damage. Eye damage. Memory and concentration problems. Vascular dementia. What actions can I take to manage this condition? Hypertension can be managed by making lifestyle changes and possibly by taking medicines. Your health care provider will help you make a plan to bring your blood pressure within a normal range. You may be referred for counseling on a healthy diet and physical activity. Nutrition  Eat a diet that is high in fiber and potassium, and low in salt (sodium), added sugar, and fat. An example eating plan is called the DASH diet. DASH stands for Dietary Approaches to Stop Hypertension. To eat this way: Eat plenty of fresh fruits and vegetables. Try to fill one-half of your plate at each meal with fruits and vegetables. Eat whole grains, such as whole-wheat pasta, brown rice, or whole-grain bread. Fill about one-fourth of your plate with whole grains. Eat low-fat dairy products. Avoid fatty cuts of meat, processed or cured meats, and poultry with skin. Fill about one-fourth of your plate with lean proteins such as fish, chicken without skin, beans, eggs, and tofu. Avoid pre-made and processed foods. These tend to be higher in sodium, added sugar, and fat. Reduce your daily sodium intake. Many people with hypertension should eat less than 1,500 mg of sodium a day. Lifestyle  Work with your health care provider to maintain a healthy body weight or to lose weight. Ask what an ideal weight is for you. Get at least 30 minutes of exercise that causes your heart to beat faster (aerobic  exercise) most days of the week. Activities may include walking, swimming, or biking. Include exercise to strengthen your muscles (resistance exercise), such as weight lifting, as part of your weekly exercise routine. Try to do these types of exercises for 30 minutes at least 3 days a week. Do not use any products that contain nicotine or tobacco. These products include cigarettes, chewing tobacco, and vaping devices, such as e-cigarettes. If you need help quitting, ask your health care provider. Control any long-term (chronic) conditions you have, such as high cholesterol or diabetes. Identify your sources of stress and find ways to manage stress. This may include meditation, deep breathing, or making time for fun activities. Alcohol use Do not drink alcohol if: Your health care provider tells you not to drink. You are pregnant, may be pregnant, or are planning to become pregnant. If you drink alcohol: Limit how much you have to: 0-1 drink a day for women. 0-2 drinks a day for men. Know how much alcohol is in your drink. In the [place] one drink equals one 12 oz bottle of beer (355 mL), one 5 oz glass of wine (148 mL), or one 1 oz glass of hard liquor (44 mL). Medicines Your health care provider may prescribe medicine if lifestyle changes are not enough to get your blood pressure under control and if: Your systolic blood pressure is 130 or higher. Your diastolic blood pressure is 80 or higher. Take  medicines only as told by your health care provider. Follow the directions carefully. Blood pressure medicines must be taken as told by your health care provider. The medicine does not work as well when you skip doses. Skipping doses also puts you at risk for problems. Monitoring Before you monitor your blood pressure: Do not smoke, drink caffeinated beverages, or exercise within 30 minutes before taking a measurement. Use the bathroom and empty your bladder (urinate). Sit quietly for at least 5  minutes before taking measurements. Monitor your blood pressure at home as told by your health care provider. To do this: Sit with your back straight and supported. Place your feet flat on the floor. Do not cross your legs. Support your arm on a flat surface, such as a table. Make sure your upper arm is at heart level. Each time you measure, take two or three readings one minute apart and record the results. You may also need to have your blood pressure checked regularly by your health care provider. General information Talk with your health care provider about your diet, exercise habits, and other lifestyle factors that may be contributing to hypertension. Review all the medicines you take with your health care provider because there may be side effects or interactions. Keep all follow-up visits. Your health care provider can help you create and adjust your plan for managing your high blood pressure. Where to find more information National Heart, Lung, and Blood Institute: PopSte[employer].is American Heart Association: www.heart.org Contact a health care provider if: You think you are having a reaction to medicines you have taken. You have repeated (recurrent) headaches. You feel dizzy. You have swelling in your ankles. You have trouble with your vision. Get help right away if: You develop a severe headache or confusion. You have unusual weakness or numbness, or you feel faint. You have severe pain in your chest or abdomen. You vomit repeatedly. You have trouble breathing. These symptoms may be an emergency. Get help right away. Call 911. Do not wait to see if the symptoms will go away. Do not drive yourself to the hospital. Summary Hypertension is when the force of blood pumping through your arteries is too strong. If this condition is not controlled, it may put you at risk for serious complications. Your personal target blood pressure may vary depending on your medical conditions,  your age, and other factors. For most people, a normal blood pressure is less than 120/80. Hypertension is managed by lifestyle changes, medicines, or both. Lifestyle changes to help manage hypertension include losing weight, eating a healthy, low-sodium diet, exercising more, stopping smoking, and limiting alcohol. This information is not intended to replace advice given to you by your health care provider. Make sure you discuss any questions you have with your health care provider. Document Revised: 05/07/2021 Document Reviewed: 05/07/2021 Elsevier Patient Education  2024 [employer].

## 2023-03-18 ENCOUNTER — Telehealth: Payer: Self-pay

## 2023-03-18 ENCOUNTER — Ambulatory Visit: Payer: BLUE CROSS/BLUE SHIELD | Admitting: Nurse Practitioner

## 2023-03-18 NOTE — Telephone Encounter (Signed)
Patient states she has been taking a new medication for her blood pressure cloNIDine (CATAPRES) 0.1 MG tablet.  Patient states this medication makes her sleepy in the morning and at night she experiences a nagging pain her left side hurts in her heart area.  Patient states this pain goes away when she gets up.  Patient states this medication makes her feel nervous and her hands are shaky.  Patient states last night she woke up and she felt like she needed to belch but couldn't.  Patients states she sipped on some water and eventually did belch.  Patient states she felt really funny.  I transferred call to Access Nurse.

## 2023-03-18 NOTE — Telephone Encounter (Signed)
Spoke to Patient she denies any chest pain or chest tightness. Patient states since being on the Clonidine her BP is not stable and is high most of the time. Patient states "why should I take the Clonidine when it is not helping?" Patient's BP readings have been from 130/75, 159/93,170/?,162/93,163/86. Patient would like to know what she needs to do?

## 2023-03-18 NOTE — Telephone Encounter (Signed)
I would confirm that she is taking the spironolactone. If she is not taking that then she needs to restart it and have labs checked in 1 week. If she is taking the spironolactone and the losartan I would then recommend starting on amlodipine 5 mg daily. She needs to keep her appointment next week for evaluation of this issue. She can taper off the clonidine by taking half a tablet twice daily for 3 days and then half a tablet once daily for 3 days, and then discontinue the clonidine.

## 2023-03-18 NOTE — Telephone Encounter (Signed)
Left message to call the office back regarding Dr. Purvis Sheffield recommendations. Please see note below.

## 2023-03-18 NOTE — Telephone Encounter (Signed)
Left message to call the office back regarding Dr. Sonnenberg's recommendations 

## 2023-03-18 NOTE — Telephone Encounter (Signed)
Spoke to Patient and she is agreeable to Dr. Purvis Sheffield recommendations.

## 2023-03-18 NOTE — Telephone Encounter (Signed)
FYI only-See below, pt refuses to go to ED or to be seen today. Has an appt on Monday with Dr. Clent Ridges.

## 2023-03-18 NOTE — Telephone Encounter (Signed)
Noted.  Patient should certainly be evaluated right away for the chest pain and should be evaluated if it worsens or changes at all since she has refused immediate evaluation.  Please try to determine what her blood pressures have been since starting the clonidine.

## 2023-03-18 NOTE — Telephone Encounter (Signed)
Access Nurse states patient is experiencing chest pains , a pinching on left side of chest at heart, at night when she lays down.  Recommendation that she be seen at the ED.  Patient states she would rather be seen in our office.  Access Nurse states patient states this has been going on since she started taking a new medication for her blood pressure and she believes she just needs to change medications.  Patient states this has not gotten any worse.  Access Nurse states patient is not on the line, so I will call her back.  I offered patient an appointment with Bethanie Dicker, NP, today at 2:40pm, but she can't make it.  I scheduled an appointment for her to see Dr. Dana Allan on 03/21/2023.

## 2023-03-20 NOTE — Progress Notes (Signed)
SUBJECTIVE:   Chief Complaint  Patient presents with   Hypertension   HPI Patient presents to clinic for discussion of blood pressure medication  Patient of Dr Darrick Huntsman  HTN Uncontrolled with current medication.  Clonidine 0.1 mg BID recently added.  Current medications include Losartan 100 mg daily and Clonidine 0.1 mg BID.   Patient reports having self discontinued her Aldactone 2 months ago as her blood pressure was good. She continued Losartan 100 mg daily.  She developed shortness of breath about 1 month later and was sen at [facility] ED where her BP was elevated and she was started on Clonidine 0.1 mg BID. She reports that she during follow up with PCP was recommended to discontinue Inderal and continue Clonidine and Losartan.  This past month while on Clonidine had noticed BP increasing and also had some heart palpitations. She was advised recently to wean Clonidine as she was not liking side effects.  She reports her last dose of Clonidine was yesterday, she had restarted her Aldactone 50 mg at midnight last night and took a second dose this am along with her Losartan.  She has not been taking her Inderal.  PERTINENT PMH / PSH: HTN  OBJECTIVE:  BP (!) 168/100   Pulse 76   Temp 97.9 F (36.6 C)   Resp 16   Ht 5\' 5"  (1.651 m)   Wt 185 lb 3.2 oz (84 kg)   SpO2 97%   BMI 30.82 kg/m    Physical Exam Vitals reviewed.  Constitutional:      General: She is not in acute distress.    Appearance: She is not ill-appearing.  HENT:     Head: Normocephalic.     Nose: Nose normal.  Eyes:     Conjunctiva/sclera: Conjunctivae normal.  Cardiovascular:     Rate and Rhythm: Normal rate and regular rhythm.     Heart sounds: Normal heart sounds.  Pulmonary:     Effort: Pulmonary effort is normal.     Breath sounds: Normal breath sounds.  Abdominal:     General: Abdomen is flat. Bowel sounds are normal.     Palpations: Abdomen is soft.  Musculoskeletal:        General: Normal range of  motion.     Cervical back: Normal range of motion.  Neurological:     Mental Status: She is alert and oriented to person, place, and time. Mental status is at baseline.  Psychiatric:        Mood and Affect: Mood normal.        Behavior: Behavior normal.        Thought Content: Thought content normal.        Judgment: Judgment normal.        03/29/2023    3:55 PM 03/07/2023    2:24 PM 02/28/2023    1:08 PM 08/20/2022   10:45 AM 08/25/2021    1:34 PM  Depression screen PHQ 2/9  Decreased Interest 0 1 1 1  0  Down, Depressed, Hopeless 0 0 1 1 0  PHQ - 2 Score 0 1 2 2  0  Altered sleeping 0  1 1   Tired, decreased energy 3  1 1    Change in appetite 0  0 0   Feeling bad or failure about yourself  0  0 0   Trouble concentrating 0  1 1   Moving slowly or fidgety/restless 0  0 0   Suicidal thoughts 0  0 0   PHQ-9  Score 3  5 5    Difficult doing work/chores Not difficult at all  Somewhat difficult Somewhat difficult       03/29/2023    3:56 PM 02/28/2023    1:08 PM 08/20/2022   10:45 AM 05/05/2020    4:35 PM  GAD 7 : Generalized Anxiety Score  Nervous, Anxious, on Edge 0 1 1 2   Control/stop worrying 0 0 1 1  Worry too much - different things 0 0 1 1  Trouble relaxing 0 1 1 2   Restless 0 1 0 2  Easily annoyed or irritable 0 1 1 3   Afraid - awful might happen 0 0 0 1  Total GAD 7 Score 0 4 5 12   Anxiety Difficulty Not difficult at all Somewhat difficult Somewhat difficult Somewhat difficult    ASSESSMENT/PLAN:  Essential hypertension Assessment & Plan: Chronic.  Uncontrolled  Suspect BB withdrawal and non compliance with medication   Recommend restarting Propranolol 20 mg daily Recommend continuing Aldactone 50 mg daily Recommend continuing Losartan 100 mg at bedtime, could consider switching to to Telmisartan in future Patient to follow up with PCP in 1 week  Orders: -     Spironolactone; Take 1 tablet (50 mg total) by mouth daily.  Dispense: 90 tablet; Refill: 1   PDMP  reviewed  Return in about 1 week (around 03/28/2023) for PCP.  Dana Allan, MD

## 2023-03-20 NOTE — Assessment & Plan Note (Addendum)
Chronic.  Uncontrolled  Suspect BB withdrawal and non compliance with medication   Recommend restarting Propranolol 20 mg daily Recommend continuing Aldactone 50 mg daily Recommend continuing Losartan 100 mg at bedtime, could consider switching to to Telmisartan in future Patient to follow up with PCP in 1 week

## 2023-03-21 ENCOUNTER — Encounter: Payer: Self-pay | Admitting: Family Medicine

## 2023-03-21 ENCOUNTER — Ambulatory Visit (INDEPENDENT_AMBULATORY_CARE_PROVIDER_SITE_OTHER): Payer: Self-pay | Admitting: Family Medicine

## 2023-03-21 ENCOUNTER — Other Ambulatory Visit: Payer: Self-pay | Admitting: Family Medicine

## 2023-03-21 VITALS — BP 168/100 | HR 76 | Temp 97.9°F | Resp 16 | Ht 65.0 in | Wt 185.2 lb

## 2023-03-21 DIAGNOSIS — I10 Essential (primary) hypertension: Secondary | ICD-10-CM

## 2023-03-21 MED ORDER — SPIRONOLACTONE 50 MG PO TABS: 50.0000 mg | ORAL_TABLET | Freq: Every day | ORAL | 1 refills | Status: DC

## 2023-03-21 MED ORDER — PROPRANOLOL HCL 20 MG PO TABS
ORAL_TABLET | ORAL | 3 refills | Status: DC
Start: 2023-03-21 — End: 2023-03-23

## 2023-03-21 NOTE — Patient Instructions (Addendum)
It was a pleasure meeting you today. Thank you for allowing me to take part in your health care.  Our goals for today as we discussed include:  Medications to take for blood pressure  Losartan 100 mg at night Spironolactone 50 mg daily  Restart Propranolol 20 mg daily  Stop Clonidine   Continue to monitor blood pressure at home  Follow up in 1 week with PCP  If blood pressure continues to elevate and if any chest pain go to Emergency department or call 911  If you have any questions or concerns, please do not hesitate to call the office at [phone].  I look forward to our next visit and until then take care and stay safe.  Regards,   Dana Allan, MD   [facility]

## 2023-03-21 NOTE — Telephone Encounter (Signed)
  Pharmacy comment: Script Clarification:IS QTY CORRECT. ALSO, IT APPEARS PATIENT WAS TAKING TID AT ON POINT. PLEASE VERIFY SIG AS WELL.

## 2023-03-29 ENCOUNTER — Ambulatory Visit: Payer: 59 | Admitting: Family Medicine

## 2023-03-29 VITALS — BP 160/88 | HR 64 | Temp 98.4°F | Ht 65.0 in | Wt 184.2 lb

## 2023-03-29 DIAGNOSIS — I10 Essential (primary) hypertension: Secondary | ICD-10-CM | POA: Diagnosis not present

## 2023-03-29 MED ORDER — TELMISARTAN 80 MG PO TABS
80.0000 mg | ORAL_TABLET | Freq: Every day | ORAL | 0 refills | Status: DC
Start: 2023-03-29 — End: 2023-03-29

## 2023-03-29 MED ORDER — TELMISARTAN 80 MG PO TABS
80.0000 mg | ORAL_TABLET | Freq: Every evening | ORAL | 0 refills | Status: DC
Start: 2023-03-29 — End: 2023-04-21

## 2023-03-29 NOTE — Patient Instructions (Addendum)
It was a pleasure meeting you today. Thank you for allowing me to take part in your health care.  Our goals for today as we discussed include:  Stop Losartan Start Telmisartan 80 mg at night Continue Aldactone 50 mg daily Continue Propranolol 20 mg daily  Continue to monitor blood pressure at home  Follow up in 4 weeks with PCP   PAP smear is due.  Please schedule appointment at your earliest convenience  Recommend Shingles vaccine.  This is a 2 dose series and can be given at your local pharmacy.  Please talk to your pharmacist about this.    If you have any questions or concerns, please do not hesitate to call the office at [phone].  I look forward to our next visit and until then take care and stay safe.  Regards,   Dana Allan, MD   [facility]

## 2023-03-29 NOTE — Progress Notes (Signed)
SUBJECTIVE:   No chief complaint on file.  HPI Patient presents to clinic for follow up blood pressure  HTN Asymptomatic.  Has restarted Propranolol 20 mg daily, Losartan 100 mg daily and Aldactone 50 mg daily.  Blood pressure remains elevated.  Discussed switching to Telmisartan and agreeable to trial.   Office Visit 07/15 HTN Uncontrolled with current medication.  Clonidine 0.1 mg BID recently added.  Current medications include Losartan 100 mg daily and Clonidine 0.1 mg BID.   Patient reports having self discontinued her Aldactone 2 months ago as her blood pressure was good. She continued Losartan 100 mg daily.  She developed shortness of breath about 1 month later and was sen at [facility] ED where her BP was elevated and she was started on Clonidine 0.1 mg BID. She reports that she during follow up with PCP was recommended to discontinue Inderal and continue Clonidine and Losartan.  This past month while on Clonidine had noticed BP increasing and also had some heart palpitations. She was advised recently to wean Clonidine as she was not liking side effects.  She reports her last dose of Clonidine was yesterday, she had restarted her Aldactone 50 mg at midnight last night and took a second dose this am along with her Losartan.  She has not been taking her Inderal.  PERTINENT PMH / PSH: HTN  OBJECTIVE:  BP (!) 160/88 (BP Location: Left Arm, Patient Position: Sitting, Cuff Size: Normal)   Pulse 64   Temp 98.4 F (36.9 C) (Oral)   Ht 5\' 5"  (1.651 m)   Wt 184 lb 3.2 oz (83.6 kg)   SpO2 97%   BMI 30.65 kg/m    Physical Exam Vitals reviewed.  Constitutional:      General: She is not in acute distress.    Appearance: She is not ill-appearing.  HENT:     Head: Normocephalic.  Eyes:     Conjunctiva/sclera: Conjunctivae normal.  Cardiovascular:     Rate and Rhythm: Normal rate and regular rhythm.     Heart sounds: Normal heart sounds.  Pulmonary:     Effort: Pulmonary effort is  normal.     Breath sounds: Normal breath sounds.  Musculoskeletal:        General: Normal range of motion.  Neurological:     Mental Status: She is alert and oriented to person, place, and time. Mental status is at baseline.  Psychiatric:        Mood and Affect: Mood normal.        Behavior: Behavior normal.        Thought Content: Thought content normal.        Judgment: Judgment normal.        03/29/2023    3:55 PM 03/07/2023    2:24 PM 02/28/2023    1:08 PM 08/20/2022   10:45 AM 08/25/2021    1:34 PM  Depression screen PHQ 2/9  Decreased Interest 0 1 1 1  0  Down, Depressed, Hopeless 0 0 1 1 0  PHQ - 2 Score 0 1 2 2  0  Altered sleeping 0  1 1   Tired, decreased energy 3  1 1    Change in appetite 0  0 0   Feeling bad or failure about yourself  0  0 0   Trouble concentrating 0  1 1   Moving slowly or fidgety/restless 0  0 0   Suicidal thoughts 0  0 0   PHQ-9 Score 3  5 5  Difficult doing work/chores Not difficult at all  Somewhat difficult Somewhat difficult       03/29/2023    3:56 PM 02/28/2023    1:08 PM 08/20/2022   10:45 AM 05/05/2020    4:35 PM  GAD 7 : Generalized Anxiety Score  Nervous, Anxious, on Edge 0 1 1 2   Control/stop worrying 0 0 1 1  Worry too much - different things 0 0 1 1  Trouble relaxing 0 1 1 2   Restless 0 1 0 2  Easily annoyed or irritable 0 1 1 3   Afraid - awful might happen 0 0 0 1  Total GAD 7 Score 0 4 5 12   Anxiety Difficulty Not difficult at all Somewhat difficult Somewhat difficult Somewhat difficult    ASSESSMENT/PLAN:  Essential hypertension Assessment & Plan: Chronic.  Continues to remain elevated despite restarting previous medications and doses Discontinue Losartan Start Telmisartan 80 mg daily Continue Aldactone 50 mg daily Continue Propranolol 20 mg daily Monitor BP at home.  Goal < 150/90 per JNC 8 guidelines Patient to follow up with PCP in 4 weeks or sooner if symptoms worsen  Orders: -     Telmisartan; Take 1 tablet  (80 mg total) by mouth at bedtime.  Dispense: 90 tablet; Refill: 0    PDMP reviewed  Return in about 4 weeks (around 04/26/2023) for PCP.  Dana Allan, MD

## 2023-04-04 ENCOUNTER — Telehealth: Payer: Self-pay | Admitting: Internal Medicine

## 2023-04-04 NOTE — Telephone Encounter (Signed)
Lvm

## 2023-04-04 NOTE — Telephone Encounter (Signed)
Pt would like to be called regarding her telmisartan

## 2023-04-05 NOTE — Telephone Encounter (Signed)
Spoke with pt pt stated she was having stomach like flutters yesterday and Sunday but felt fine today and after exercising on yesterday she felt better as well.  Pt stated she thinks it was her nerves/anxiety from starting a new medication. Pt denied any chest pain. Pt denied scheduling a sooner appt and stated she would keep her f/up that is scheduled for 08/22. Advise pt to go to local ED if she develops any chest pain. Pt verbalized understanding

## 2023-04-06 ENCOUNTER — Other Ambulatory Visit: Payer: Self-pay | Admitting: Family Medicine

## 2023-04-06 ENCOUNTER — Telehealth: Payer: Self-pay

## 2023-04-06 ENCOUNTER — Encounter (INDEPENDENT_AMBULATORY_CARE_PROVIDER_SITE_OTHER): Payer: Self-pay

## 2023-04-06 NOTE — Telephone Encounter (Signed)
Pt called and stated that she wanted to talk to Dr. Clent Ridges about her medication. I did advise her that I was her CMA but she stated that she wanted to talk to Dr. Clent Ridges.

## 2023-04-06 NOTE — Telephone Encounter (Signed)
Patient states she is following-up on her previous call.  Patient she has been taking telmisartan (MICARDIS) 80 MG tablet.  Patient states she is taking this medication before she goes to bed.  Patient states she wakes up in the middle of the night feeling nauseous.  Patient states when she wakes up in the morning her stomach feels "jumpy".  Patient states she feels light-headed and experiences fatigue.  Patient states her blood pressure has gone down, but she is not feeling like herself.  Patient states she would like to know what Dr. Dana Allan suggests.  Patient states she is wondering if this dose of the medication is too strong for her.

## 2023-04-08 NOTE — Telephone Encounter (Signed)
Called and pt did not answer I left a detailed vm informing pt of what Dr. Clent Ridges stated

## 2023-04-11 ENCOUNTER — Encounter: Payer: Self-pay | Admitting: Family Medicine

## 2023-04-11 NOTE — Assessment & Plan Note (Signed)
Chronic.  Continues to remain elevated despite restarting previous medications and doses Discontinue Losartan Start Telmisartan 80 mg daily Continue Aldactone 50 mg daily Continue Propranolol 20 mg daily Monitor BP at home.  Goal < 150/90 per JNC 8 guidelines Patient to follow up with PCP in 4 weeks or sooner if symptoms worsen

## 2023-04-19 ENCOUNTER — Ambulatory Visit: Payer: Self-pay

## 2023-04-19 NOTE — Patient Instructions (Signed)
Visit Information  Thank you for taking time to visit with me today. Please don't hesitate to contact me if I can be of assistance to you.   Following are the goals we discussed today:   Goals Addressed             This Visit's Progress    management / education of health conditions       Interventions Today    Flowsheet Row Most Recent Value  Chronic Disease   Chronic disease during today's visit Hypertension (HTN), Other  [anxiety, SOB]  General Interventions   General Interventions Discussed/Reviewed General Interventions Reviewed, Doctor Visits  [evaluation of current treatment plan for HTN, anxiety, SOB and patients adherence to plan as established by provider.  Assessed for ongoing anxiety/ SOB symptoms.  Assessed for BP readings.]  Doctor Visits Discussed/Reviewed Doctor Visits Reviewed  Annabell Sabal upcoming provider visits. Advised to keep follow up appointments with providers.]  Education Interventions   Education Provided Provided Education  Provided Verbal Education On Other  [Advised to continue monitoring blood pressures reporting to provider readings outside of established parameters. Advised to take blood pressure readings to next follow up visit with primary care provider.]  Pharmacy Interventions   Pharmacy Dicussed/Reviewed Pharmacy Topics Reviewed  [discussed medication adjustments.  Patient advised to to notify provider of medication side effects/ symptoms. Discussed medication compliance. Advised to notify provider of taking OTC magnesium/zinc/D3 combo supplement.]              Our next appointment is by telephone on 05/24/23 at 2:30 pm  Please call the care guide team at [phone] if you need to cancel or reschedule your appointment.   If you are experiencing a Mental Health or Behavioral Health Crisis or need someone to talk to, please call the Suicide and Crisis Lifeline: 988 call 1-800-273-TALK (toll free, 24 hour hotline)  Patient verbalizes  understanding of instructions and care plan provided today and agrees to view in MyChart. Active MyChart status and patient understanding of how to access instructions and care plan via MyChart confirmed with patient.     George Ina RN,BSN,CCM [facility] Care Coordination [phone] direct line

## 2023-04-19 NOTE — Patient Outreach (Signed)
  Care Coordination   Follow Up Visit Note   04/19/2023 Name: Elizabeth [name] MRN: [id number] DOB: June 19, 1961  Elizabeth [name] is a 62 y.o. year old female who sees Darrick Huntsman, Mar Daring, MD for primary care. I spoke with  Lowella Curb by phone today.  What matters to the patients health and wellness today?  Patient states her blood pressures have lowered some. She states her blood pressures are ranging from 128/89 to 160/90's.  She states he has noticed her blood pressure elevates more in the later afternoons prior to 2nd dose of BP medications.  Patient states she is having a nervous/ fluttering feeling in her mid section since being on the new BP medication.  Patient states she is going to discuss this with her doctor because she would rather be back on her losartan.   Patient reports SOB has resolved and anxiety has lessened.     Goals Addressed             This Visit's Progress    management / education of health conditions       Interventions Today    Flowsheet Row Most Recent Value  Chronic Disease   Chronic disease during today's visit Hypertension (HTN), Other  [anxiety, SOB]  General Interventions   General Interventions Discussed/Reviewed General Interventions Reviewed, Doctor Visits  [evaluation of current treatment plan for HTN, anxiety, SOB and patients adherence to plan as established by provider.  Assessed for ongoing anxiety/ SOB symptoms.  Assessed for BP readings.]  Doctor Visits Discussed/Reviewed Doctor Visits Reviewed  Annabell Sabal upcoming provider visits. Advised to keep follow up appointments with providers.]  Education Interventions   Education Provided Provided Education  Provided Verbal Education On Other  [Advised to continue monitoring blood pressures reporting to provider readings outside of established parameters. Advised to take blood pressure readings to next follow up visit with primary care provider.]  Pharmacy Interventions   Pharmacy Dicussed/Reviewed  Pharmacy Topics Reviewed  [discussed medication adjustments.  Patient advised to to notify provider of medication side effects/ symptoms. Discussed medication compliance. Advised to notify provider of taking OTC magnesium/zinc/D3 combo supplement.]              SDOH assessments and interventions completed:  No     Care Coordination Interventions:  Yes, provided   Follow up plan: Follow up call scheduled for 05/24/23    Encounter Outcome:  Pt. Visit Completed   George Ina RN,BSN,CCM [facility] Care Coordination [phone] direct line

## 2023-04-20 ENCOUNTER — Telehealth: Payer: Self-pay | Admitting: Internal Medicine

## 2023-04-20 NOTE — Telephone Encounter (Signed)
Patient just called and wants to let Dr. Darrick Huntsman know that she would like to go back to her other blood pressure medicine she took before named Losatan. Her number is [phone]. The pharmacy she uses is [facility] [phone] [facility], [place] - [address] [place], [place] [address] Phone: [phone]  Fax: [phone]

## 2023-04-21 ENCOUNTER — Other Ambulatory Visit: Payer: Self-pay | Admitting: Family Medicine

## 2023-04-21 DIAGNOSIS — I10 Essential (primary) hypertension: Secondary | ICD-10-CM

## 2023-04-21 MED ORDER — LOSARTAN POTASSIUM 100 MG PO TABS
100.0000 mg | ORAL_TABLET | Freq: Every day | ORAL | 3 refills | Status: DC
Start: 2023-04-21 — End: 2024-04-26

## 2023-04-21 NOTE — Telephone Encounter (Signed)
Spoke with pt and she stated that she is still having trouble with the Telmisartan making her stomach feel "jittery, nauseous". Pt would like to switch back to the losartan. Pt saw Dr. Clent Ridges when the medication was changed and has a follow up schedule with Dr. Clent Ridges on 04/08/2023.

## 2023-04-22 NOTE — Telephone Encounter (Signed)
Pt is aware and gave a verbal understanding.  

## 2023-04-28 ENCOUNTER — Ambulatory Visit: Payer: 59 | Admitting: Family Medicine

## 2023-04-29 ENCOUNTER — Ambulatory Visit: Payer: 59 | Admitting: Internal Medicine

## 2023-04-29 ENCOUNTER — Encounter: Payer: Self-pay | Admitting: Internal Medicine

## 2023-04-29 VITALS — BP 138/80 | HR 67 | Temp 98.3°F | Ht 65.0 in | Wt 185.0 lb

## 2023-04-29 DIAGNOSIS — I10 Essential (primary) hypertension: Secondary | ICD-10-CM

## 2023-04-29 DIAGNOSIS — R944 Abnormal results of kidney function studies: Secondary | ICD-10-CM | POA: Diagnosis not present

## 2023-04-29 DIAGNOSIS — N6310 Unspecified lump in the right breast, unspecified quadrant: Secondary | ICD-10-CM | POA: Diagnosis not present

## 2023-04-29 DIAGNOSIS — F411 Generalized anxiety disorder: Secondary | ICD-10-CM | POA: Diagnosis not present

## 2023-04-29 DIAGNOSIS — Z1231 Encounter for screening mammogram for malignant neoplasm of breast: Secondary | ICD-10-CM

## 2023-04-29 MED ORDER — GABAPENTIN 300 MG PO CAPS: 300.0000 mg | ORAL_CAPSULE | Freq: Every day | ORAL | 2 refills | Status: DC | PRN

## 2023-04-29 NOTE — Patient Instructions (Addendum)
We are ALMOST AT GOAL BP OF 130/80 ,  SO I recommend :  Increase the propranolol to twice daily  (morning and afternoon )    Check you BP at bedtime  for one week .  If the readings are still >  130/80,  we will add a bedtime dose  of propranolol

## 2023-04-29 NOTE — Progress Notes (Unsigned)
Subjective:  Patient ID: Elizabeth [name], female    DOB: May 30, 1961  Age: 62 y.o. MRN: [id number]  CC: The primary encounter diagnosis was Essential hypertension. Diagnoses of Generalized anxiety disorder, Decreased glomerular filtration rate (GFR), Mass of right breast, unspecified quadrant, and Breast cancer screening by mammogram were also pertinent to this visit.   HPI Elizabeth [name] presents for  Chief Complaint  Patient presents with   Medical Management of Chronic Issues    1) HTN:   [name] had  an ER visit for dyspnea in mid June.  She  was given prednisone for bronchitis and po clonidine for systolic reading of 203/93   She Did not tolerate  clonidine.  She also did not tolerate  telmisartan.  She was advised to resume losartan 100 mg daily,   aldactone 50 mg  daily and propranolol 20 mg daily in the morning.    She has been checking  blood pressure daily at home  between 10 am and 11.  Readings have been for the most part >130/80 at rest . Patient is following a reduced salt diet most days and is taking medications as prescribed. Feels   better on current regimen.    2) GAD:  feeling ok without the sertraline . Stopped it during trial of clonidine due to rextreme side effects of anxiety and fear   Outpatient Medications Prior to Visit  Medication Sig Dispense Refill   cetirizine (ZYRTEC) 10 MG tablet Take 1 tablet (10 mg total) by mouth daily. 30 tablet 11   cyclobenzaprine (FLEXERIL) 5 MG tablet Take 1 tablet (5 mg total) by mouth 3 (three) times daily as needed for muscle spasms. 90 tablet 2   ergocalciferol (DRISDOL) 1.25 MG (50000 UT) capsule Take 1 capsule (50,000 Units total) by mouth once a week. 12 capsule 0   fluticasone (FLONASE) 50 MCG/ACT nasal spray Place 1 spray into both nostrils 2 (two) times daily as needed for rhinitis or allergies (allergies). 48 g 1   losartan (COZAAR) 100 MG tablet Take 1 tablet (100 mg total) by mouth daily. 90 tablet 3   Multiple  Minerals-Vitamins (CALCIUM-MAGNESIUM-ZINC-D3) TABS Take by mouth. Patient states taking 3 tablets per day     propylthiouracil (PTU) 50 MG tablet Take 1 tablet (50 mg total) by mouth daily. 90 tablet 3   spironolactone (ALDACTONE) 50 MG tablet Take 1 tablet (50 mg total) by mouth daily. 90 tablet 1   propranolol (INDERAL) 20 MG tablet TAKE 1 TABLET BY MOUTH EVERY DAY 90 tablet 3   Ruxolitinib Phosphate (OPZELURA) 1.5 % CREA Apply 1 application topically daily. Apply to rash QD until clear then PRN. (Patient not taking: Reported on 04/19/2023) 60 g 1   sertraline (ZOLOFT) 50 MG tablet Take 1 tablet (50 mg total) by mouth daily after breakfast. (Patient not taking: Reported on 04/29/2023) 90 tablet 3   triamcinolone cream (KENALOG) 0.1 % Apply 1 application topically 2 (two) times daily. (Patient not taking: Reported on 04/19/2023) 30 g 0   gabapentin (NEURONTIN) 300 MG capsule Take 1 capsule (300 mg total) by mouth daily as needed (neurpoathy). 30 capsule 2   No facility-administered medications prior to visit.    Review of Systems;  Patient denies headache, fevers, malaise, unintentional weight loss, skin rash, eye pain, sinus congestion and sinus pain, sore throat, dysphagia,  hemoptysis , cough, dyspnea, wheezing, chest pain, palpitations, orthopnea, edema, abdominal pain, nausea, melena, diarrhea, constipation, flank pain, dysuria, hematuria, urinary  Frequency, nocturia, numbness, tingling, seizures,  Focal weakness, Loss of consciousness,  Tremor, insomnia, depression, anxiety, and suicidal ideation.      Objective:  BP 138/80   Pulse 67   Temp 98.3 F (36.8 C) (Oral)   Ht 5\' 5"  (1.651 m)   Wt 185 lb (83.9 kg)   SpO2 97%   BMI 30.79 kg/m   BP Readings from Last 3 Encounters:  04/29/23 138/80  03/29/23 (!) 160/88  03/21/23 (!) 168/100    Wt Readings from Last 3 Encounters:  04/29/23 185 lb (83.9 kg)  03/29/23 184 lb 3.2 oz (83.6 kg)  03/21/23 185 lb 3.2 oz (84 kg)     Physical Exam Vitals reviewed.  Constitutional:      General: She is not in acute distress.    Appearance: Normal appearance. She is normal weight. She is not ill-appearing, toxic-appearing or diaphoretic.  HENT:     Head: Normocephalic.  Eyes:     General: No scleral icterus.       Right eye: No discharge.        Left eye: No discharge.     Conjunctiva/sclera: Conjunctivae normal.  Cardiovascular:     Rate and Rhythm: Normal rate and regular rhythm.     Heart sounds: Normal heart sounds.  Pulmonary:     Effort: Pulmonary effort is normal. No respiratory distress.     Breath sounds: Normal breath sounds.  Musculoskeletal:        General: Normal range of motion.  Skin:    General: Skin is warm and dry.  Neurological:     General: No focal deficit present.     Mental Status: She is alert and oriented to person, place, and time. Mental status is at baseline.  Psychiatric:        Mood and Affect: Mood normal.        Behavior: Behavior normal.        Thought Content: Thought content normal.        Judgment: Judgment normal.    Lab Results  Component Value Date   HGBA1C 6.3 08/20/2022   HGBA1C 5.7 08/23/2019   HGBA1C 5.8 01/23/2019    Lab Results  Component Value Date   CREATININE 1.11 (H) 04/29/2023   CREATININE 1.00 02/16/2023   CREATININE 1.04 08/20/2022    Lab Results  Component Value Date   WBC 6.9 02/16/2023   HGB 13.0 02/16/2023   HCT 40.4 02/16/2023   PLT 321 02/16/2023   GLUCOSE 78 04/29/2023   CHOL 238 (H) 02/27/2019   TRIG 125.0 02/27/2019   HDL 54.90 02/27/2019   LDLCALC 158 (H) 02/27/2019   ALT 25 02/16/2023   AST 28 02/16/2023   NA 137 04/29/2023   K 4.2 04/29/2023   CL 103 04/29/2023   CREATININE 1.11 (H) 04/29/2023   BUN 14 04/29/2023   CO2 21 04/29/2023   TSH 2.50 02/28/2023   HGBA1C 6.3 08/20/2022   MICROALBUR <0.7 08/20/2022    DG Chest 2 View  Result Date: 02/16/2023 CLINICAL DATA:  Shortness of breath x1 month. EXAM: CHEST -  2 VIEW COMPARISON:  November 10, 2021 FINDINGS: The heart size and mediastinal contours are within normal limits. Mild, stable linear scarring and/or atelectasis is seen along the periphery of the left lung base. There is no evidence of acute infiltrate, pleural effusion or pneumothorax. The visualized skeletal structures are unremarkable. IMPRESSION: No active cardiopulmonary disease. Electronically Signed   By: Aram Candela M.D.   On: 02/16/2023 18:35    Assessment &  Plan:  .Essential hypertension Assessment & Plan: Improved, but not at goal.  Takig propranolol at 20 mg daily dose,   added by Endocrinology for management of  hyperthyroidism.  Now taking spironolactone for possible  hyperaldosteronism (screened recently with equivocal results ( PAC is 7 and PAC/PRA is 33 )  And losartan 100 mg daily.   She has deferred additional medications, but wiling to increase proprarolol to twice daily .  Secondary cause of  RAS ruled out with recent renal doppler. Sleep study ordered  twice but not done despite symptoms of OSA .    Orders: -     Basic metabolic panel -     Propranolol HCl; Take 1 tablet (20 mg total) by mouth 2 (two) times daily. TAKE 1 TABLET BY MOUTH EVERY DAY  Dispense: 180 tablet; Refill: 3  Generalized anxiety disorder Assessment & Plan: No longer symptomatic despite stopping sertraline   Decreased glomerular filtration rate (GFR) Assessment & Plan: Intermittent fluctuations noted ,  with current GFR < 60 but > 50.  Advised to increase water intake given necessary use of aldactone   Lab Results  Component Value Date   CREATININE 1.11 (H) 04/29/2023      Mass of right breast, unspecified quadrant Assessment & Plan: Stable on repeat diagnostic bilateral mammogram  done at 1 year follow up in August. 2021   Radiology has advised to resume annual diagnostic mammogram of right breast and screening mammogram of left breast next august (2022) but there is no record that this has  been done.    Orders: -     MM 3D DIAGNOSTIC MAMMOGRAM UNILATERAL RIGHT BREAST; Future  Breast cancer screening by mammogram -     Digital Screening Mammogram, Left; Future  Other orders -     Gabapentin; Take 1 capsule (300 mg total) by mouth daily as needed (neurpoathy).  Dispense: 30 capsule; Refill: 2     Follow-up: Return in about 6 months (around 10/30/2023).   Sherlene Shams, MD

## 2023-04-30 LAB — BASIC METABOLIC PANEL
BUN/Creatinine Ratio: 13 (calc) (ref 6–22)
BUN: 14 mg/dL (ref 7–25)
CO2: 21 mmol/L (ref 20–32)
Calcium: 9.6 mg/dL (ref 8.6–10.4)
Chloride: 103 mmol/L (ref 98–110)
Creat: 1.11 mg/dL — ABNORMAL HIGH (ref 0.50–1.05)
Glucose, Bld: 78 mg/dL (ref 65–99)
Potassium: 4.2 mmol/L (ref 3.5–5.3)
Sodium: 137 mmol/L (ref 135–146)

## 2023-05-01 ENCOUNTER — Encounter: Payer: Self-pay | Admitting: Internal Medicine

## 2023-05-01 MED ORDER — PROPRANOLOL HCL 20 MG PO TABS
20.0000 mg | ORAL_TABLET | Freq: Two times a day (BID) | ORAL | 3 refills | Status: DC
Start: 2023-05-01 — End: 2024-05-21

## 2023-05-01 NOTE — Assessment & Plan Note (Signed)
Intermittent fluctuations noted ,  with current GFR < 60 but > 50.  Advised to increase water intake given necessary use of aldactone   Lab Results  Component Value Date   CREATININE 1.11 (H) 04/29/2023

## 2023-05-01 NOTE — Assessment & Plan Note (Addendum)
Stable on repeat diagnostic bilateral mammogram  done at 1 year follow up in August. 2021   Radiology has advised to resume annual diagnostic mammogram of right breast and screening mammogram of left breast next august (2022) but there is no record that this has been done.

## 2023-05-01 NOTE — Assessment & Plan Note (Signed)
Improved, but not at goal.  Takig propranolol at 20 mg daily dose,   added by Endocrinology for management of  hyperthyroidism.  Now taking spironolactone for possible  hyperaldosteronism (screened recently with equivocal results ( PAC is 7 and PAC/PRA is 33 )  And losartan 100 mg daily.   She has deferred additional medications, but wiling to increase proprarolol to twice daily .  Secondary cause of  RAS ruled out with recent renal doppler. Sleep study ordered  twice but not done despite symptoms of OSA .

## 2023-05-01 NOTE — Assessment & Plan Note (Signed)
No longer symptomatic despite stopping sertraline

## 2023-05-02 ENCOUNTER — Other Ambulatory Visit: Payer: Self-pay | Admitting: Internal Medicine

## 2023-05-02 DIAGNOSIS — N6310 Unspecified lump in the right breast, unspecified quadrant: Secondary | ICD-10-CM

## 2023-05-03 ENCOUNTER — Telehealth: Payer: Self-pay

## 2023-05-03 ENCOUNTER — Encounter: Payer: Self-pay | Admitting: Cardiology

## 2023-05-03 ENCOUNTER — Ambulatory Visit: Payer: 59 | Attending: Cardiology | Admitting: Cardiology

## 2023-05-03 VITALS — BP 144/84 | HR 60 | Ht 65.0 in | Wt 187.8 lb

## 2023-05-03 DIAGNOSIS — I10 Essential (primary) hypertension: Secondary | ICD-10-CM

## 2023-05-03 DIAGNOSIS — R0602 Shortness of breath: Secondary | ICD-10-CM | POA: Diagnosis not present

## 2023-05-03 NOTE — Progress Notes (Signed)
Cardiology Office Note:    Date:  05/03/2023   ID:  Elizabeth [name], DOB 02/06/1961, MRN [id number]  PCP:  Sherlene Shams, MD   [facility] Providers Cardiologist:  Debbe Odea, MD     Referring MD: Sherlene Shams, MD   Chief Complaint  Patient presents with   New Patient (Initial Visit)    Referred for cardiac evaluation for Hypertension and Exertional dyspnea with no cardiac.  Patient reports blood pressure and dyspnea has improved since referral was made.     Elizabeth [name] is a 62 y.o. female who is being seen today for the evaluation of shortness of breath at the request of Darrick Huntsman Mar Daring, MD.   History of Present Illness:    Elizabeth [name] is a 62 y.o. female with a hx of hypertension, hyperthyroidism, former smoker who presents due to shortness of breath on exertion.  Had symptoms of shortness of breath about 2 months ago prompting her to go to the emergency room.  Shortness of breath and fatigue where ongoing for about 2 months.  Has a history of hypertension, supposed to be on losartan and Aldactone.  States not taking Aldactone for about a month or 2 prior to symptoms.  In the ED, systolic blood pressures were noted to be in the 200s.  Was given clonidine, followed up with PCP, prior BP meds were restarted.  States blood pressures have been better controlled with systolics in the 120s to 140s.  Denies any symptoms of shortness of breath with controlled BP.  Denies chest pain.  Feels well, no concerns at this time.  Past Medical History:  Diagnosis Date   Anxiety    Arthritis    Complication of anesthesia    Slow to wake up   Dental bridge present    permanent upper   GERD (gastroesophageal reflux disease)    Gout    Helicobacter pylori gastritis 02/17/2021   Hypertension    Hyperthyroidism    Thyroid disease    Vertigo     Past Surgical History:  Procedure Laterality Date   ENDOMETRIAL ABLATION  2010   PARATHYROIDECTOMY Right 01/05/2021    Procedure: RIGHT INFERIOR PARATHYROIDECTOMY;  Surgeon: Darnell Level, MD;  Location: [facility];  Service: General;  Laterality: Right;   SHOULDER ARTHROSCOPY WITH ROTATOR CUFF REPAIR AND SUBACROMIAL DECOMPRESSION Right 01/16/2021   Procedure: Right shoulder arthroscopic subscapularis repair, rotator cuff repair, subacromial decompression, and biceps tenodesis - Dedra Skeens to Assist;  Surgeon: Signa Kell, MD;  Location: [facility];  Service: Orthopedics;  Laterality: Right;   WRIST SURGERY Right     Current Medications: Current Meds  Medication Sig   cetirizine (ZYRTEC) 10 MG tablet Take 1 tablet (10 mg total) by mouth daily.   cyclobenzaprine (FLEXERIL) 5 MG tablet Take 1 tablet (5 mg total) by mouth 3 (three) times daily as needed for muscle spasms.   ergocalciferol (DRISDOL) 1.25 MG (50000 UT) capsule Take 1 capsule (50,000 Units total) by mouth once a week.   fluticasone (FLONASE) 50 MCG/ACT nasal spray Place 1 spray into both nostrils 2 (two) times daily as needed for rhinitis or allergies (allergies).   gabapentin (NEURONTIN) 300 MG capsule Take 1 capsule (300 mg total) by mouth daily as needed (neurpoathy).   losartan (COZAAR) 100 MG tablet Take 1 tablet (100 mg total) by mouth daily.   Multiple Minerals-Vitamins (CALCIUM-MAGNESIUM-ZINC-D3) TABS Take by mouth. Patient states taking 3 tablets per day   propranolol (INDERAL) 20 MG tablet Take  1 tablet (20 mg total) by mouth 2 (two) times daily. TAKE 1 TABLET BY MOUTH EVERY DAY   propylthiouracil (PTU) 50 MG tablet Take 1 tablet (50 mg total) by mouth daily.   spironolactone (ALDACTONE) 50 MG tablet Take 1 tablet (50 mg total) by mouth daily.     Allergies:   Clonidine derivatives, Codeine, Metoprolol, and Tramadol   Social History   Socioeconomic History   Marital status: Married    Spouse name: Not on file   Number of children: Not on file   Years of education: Not on file   Highest education level: Some college, no degree   Occupational History   Not on file  Tobacco Use   Smoking status: Former    Current packs/day: 0.00    Types: Cigarettes    Quit date: 01/12/1999    Years since quitting: 24.3   Smokeless tobacco: Never  Vaping Use   Vaping status: Never Used  Substance and Sexual Activity   Alcohol use: Yes    Comment: occ   Drug use: Never   Sexual activity: Not on file  Other Topics Concern   Not on file  Social History Narrative   Lives with husband [name] (also a pt Geographical information systems officer)   Right handed   Social Determinants of Health   Financial Resource Strain: Low Risk  (03/29/2023)   Overall Financial Resource Strain (CARDIA)    Difficulty of Paying Living Expenses: Not very hard  Food Insecurity: No Food Insecurity (03/29/2023)   Hunger Vital Sign    Worried About Running Out of Food in the Last Year: Never true    Ran Out of Food in the Last Year: Never true  Transportation Needs: No Transportation Needs (03/29/2023)   PRAPARE - Administrator, Civil Service (Medical): No    Lack of Transportation (Non-Medical): No  Physical Activity: Insufficiently Active (03/29/2023)   Exercise Vital Sign    Days of Exercise per Week: 1 day    Minutes of Exercise per Session: 30 min  Stress: Stress Concern Present (03/29/2023)   [employer] of Occupational Health - Occupational Stress Questionnaire    Feeling of Stress : To some extent  Social Connections: Moderately Integrated (03/29/2023)   Social Connection and Isolation Panel [NHANES]    Frequency of Communication with Friends and Family: Twice a week    Frequency of Social Gatherings with Friends and Family: Three times a week    Attends Religious Services: More than 4 times per year    Active Member of Clubs or Organizations: No    Attends Engineer, structural: Not on file    Marital Status: Married     Family History: The patient's family history includes Heart attack in her father; Heart disease in her  mother; Heart failure in her mother; Hypertension in her father and mother.  ROS:   Please see the history of present illness.     All other systems reviewed and are negative.  EKGs/Labs/Other Studies Reviewed:    The following studies were reviewed today:  EKG Interpretation Date/Time:  Tuesday May 03 2023 10:22:56 EDT Ventricular Rate:  60 PR Interval:  150 QRS Duration:  76 QT Interval:  382 QTC Calculation: 382 R Axis:   29  Text Interpretation: Normal sinus rhythm Normal ECG Confirmed by Debbe Odea ([id number]) on 05/03/2023 10:26:24 AM    Recent Labs: 02/16/2023: ALT 25; B Natriuretic Peptide 75.7; Hemoglobin 13.0; Platelets 321 02/28/2023: Magnesium 2.0; TSH  2.50 04/29/2023: BUN 14; Creat 1.11; Potassium 4.2; Sodium 137  Recent Lipid Panel    Component Value Date/Time   CHOL 238 (H) 02/27/2019 0934   TRIG 125.0 02/27/2019 0934   HDL 54.90 02/27/2019 0934   CHOLHDL 4 02/27/2019 0934   VLDL 25.0 02/27/2019 0934   LDLCALC 158 (H) 02/27/2019 0934     Risk Assessment/Calculations:     HYPERTENSION CONTROL Vitals:   05/03/23 1017 05/03/23 1024  BP: (!) 140/86 (!) 144/84    The patient's blood pressure is elevated above target today.  In order to address the patient's elevated BP: Blood pressure will be monitored at home to determine if medication changes need to be made.           Physical Exam:    VS:  BP (!) 144/84 (BP Location: Right Arm, Patient Position: Sitting, Cuff Size: Large)   Pulse 60   Ht 5\' 5"  (1.651 m)   Wt 187 lb 12.8 oz (85.2 kg)   SpO2 98%   BMI 31.25 kg/m     Wt Readings from Last 3 Encounters:  05/03/23 187 lb 12.8 oz (85.2 kg)  04/29/23 185 lb (83.9 kg)  03/29/23 184 lb 3.2 oz (83.6 kg)     GEN:  Well nourished, well developed in no acute distress HEENT: Normal NECK: No JVD; No carotid bruits CARDIAC: RRR, no murmurs, rubs, gallops RESPIRATORY:  Clear to auscultation without rales, wheezing or rhonchi  ABDOMEN: Soft,  non-tender, non-distended MUSCULOSKELETAL:  No edema; No deformity  SKIN: Warm and dry NEUROLOGIC:  Alert and oriented x 3 PSYCHIATRIC:  Normal affect   ASSESSMENT:    1. SOB (shortness of breath)   2. Primary hypertension    PLAN:    In order of problems listed above:  Shortness of breath, possibly from uncontrolled hypertension.  Symptoms resolve with better BP control.  Obtain echo to rule out any structural abnormalities. Hypertension, BP controlled at home, elevated today.  Continue losartan 100, Aldactone 50.  Low-salt diet emphasized.  Follow-up after echo.     Medication Adjustments/Labs and Tests Ordered: Current medicines are reviewed at length with the patient today.  Concerns regarding medicines are outlined above.  Orders Placed This Encounter  Procedures   EKG 12-Lead   ECHOCARDIOGRAM COMPLETE   No orders of the defined types were placed in this encounter.   Patient Instructions  Medication Instructions:   Your physician recommends that you continue on your current medications as directed. Please refer to the Current Medication list given to you today.  *If you need a refill on your cardiac medications before your next appointment, please call your pharmacy*   Lab Work:  None Ordered  If you have labs (blood work) drawn today and your tests are completely normal, you will receive your results only by: MyChart Message (if you have MyChart) OR A paper copy in the mail If you have any lab test that is abnormal or we need to change your treatment, we will call you to review the results.   Testing/Procedures:  Your physician has requested that you have an echocardiogram. Echocardiography is a painless test that uses sound waves to create images of your heart. It provides your doctor with information about the size and shape of your heart and how well your heart's chambers and valves are working. This procedure takes approximately one hour. There are no  restrictions for this procedure. Please do NOT wear cologne, perfume, aftershave, or lotions (deodorant is allowed). Please  arrive 15 minutes prior to your appointment time.    Follow-Up: At [facility], you and your health needs are our priority.  As part of our continuing mission to provide you with exceptional heart care, we have created designated Provider Care Teams.  These Care Teams include your primary Cardiologist (physician) and Advanced Practice Providers (APPs -  Physician Assistants and Nurse Practitioners) who all work together to provide you with the care you need, when you need it.  We recommend signing up for the patient portal called "MyChart".  Sign up information is provided on this After Visit Summary.  MyChart is used to connect with patients for Virtual Visits (Telemedicine).  Patients are able to view lab/test results, encounter notes, upcoming appointments, etc.  Non-urgent messages can be sent to your provider as well.   To learn more about what you can do with MyChart, go to [email or web address].    Your next appointment:   2 month(s)  Provider:   You may see Debbe Odea, MD or one of the following Advanced Practice Providers on your designated Care Team:   Nicolasa Ducking, NP Eula Listen, PA-C Cadence Fransico Michael, PA-C Charlsie Quest, NP   Signed, Debbe Odea, MD  05/03/2023 11:10 AM     [facility]

## 2023-05-03 NOTE — Patient Instructions (Signed)
Medication Instructions:   Your physician recommends that you continue on your current medications as directed. Please refer to the Current Medication list given to you today.  *If you need a refill on your cardiac medications before your next appointment, please call your pharmacy*   Lab Work:  None Ordered  If you have labs (blood work) drawn today and your tests are completely normal, you will receive your results only by: MyChart Message (if you have MyChart) OR A paper copy in the mail If you have any lab test that is abnormal or we need to change your treatment, we will call you to review the results.   Testing/Procedures:  Your physician has requested that you have an echocardiogram. Echocardiography is a painless test that uses sound waves to create images of your heart. It provides your doctor with information about the size and shape of your heart and how well your heart's chambers and valves are working. This procedure takes approximately one hour. There are no restrictions for this procedure. Please do NOT wear cologne, perfume, aftershave, or lotions (deodorant is allowed). Please arrive 15 minutes prior to your appointment time.    Follow-Up: At [facility], you and your health needs are our priority.  As part of our continuing mission to provide you with exceptional heart care, we have created designated Provider Care Teams.  These Care Teams include your primary Cardiologist (physician) and Advanced Practice Providers (APPs -  Physician Assistants and Nurse Practitioners) who all work together to provide you with the care you need, when you need it.  We recommend signing up for the patient portal called "MyChart".  Sign up information is provided on this After Visit Summary.  MyChart is used to connect with patients for Virtual Visits (Telemedicine).  Patients are able to view lab/test results, encounter notes, upcoming appointments, etc.  Non-urgent messages can  be sent to your provider as well.   To learn more about what you can do with MyChart, go to [email or web address].    Your next appointment:   2 month(s)  Provider:   You may see Brian Agbor-Etang, MD or one of the following Advanced Practice Providers on your designated Care Team:   Christopher Berge, NP Ryan Dunn, PA-C Cadence Furth, PA-C Sheri Hammock, NP  

## 2023-05-03 NOTE — Telephone Encounter (Signed)
LMTCB in regards to lab results.  

## 2023-05-03 NOTE — Telephone Encounter (Signed)
Patient states she is returning our call.  I read Dr. Rosey Bath Tullo's message to patient.  Patient states she is not taking NSAIDs and she is drinking a lot of water.  Patient states she had an allergic reaction to clonadine and she is wondering if that may be impacting her kidney function.  Patient states she will call to schedule her appointment for her mammogram.  I gave patient the phone number for [facility]

## 2023-05-03 NOTE — Telephone Encounter (Signed)
-----   Message from Sherlene Shams sent at 05/01/2023  1:35 PM EDT ----- 1) Kidney function is down a little again.Marland Kitchen  confirm that she is avoiding NSAIDs and drinking at least 60 ounces of water daily  2) she appears to be very overdue for mammogram.  In August 2021 she had additional screening images of right breast and was told to resume annual mammograms in August 2022 with a diagnostic on the right, and a screening on the left).  If I AM WRONG PLEASE CORRECT ME,  BUT THE APPROPRIATE Mammograms have been ordered

## 2023-05-05 NOTE — Telephone Encounter (Signed)
LMTCB

## 2023-05-12 ENCOUNTER — Ambulatory Visit: Payer: 59 | Admitting: Family Medicine

## 2023-05-13 NOTE — Telephone Encounter (Signed)
Pt is aware and gave a verbal understanding.  

## 2023-05-16 ENCOUNTER — Ambulatory Visit: Payer: 59 | Admitting: Internal Medicine

## 2023-05-16 ENCOUNTER — Encounter: Payer: Self-pay | Admitting: Internal Medicine

## 2023-05-16 VITALS — BP 120/80 | HR 56 | Ht 65.0 in | Wt 187.0 lb

## 2023-05-16 DIAGNOSIS — E0590 Thyrotoxicosis, unspecified without thyrotoxic crisis or storm: Secondary | ICD-10-CM | POA: Diagnosis not present

## 2023-05-16 LAB — TSH: TSH: 2.47 u[IU]/mL (ref 0.35–5.50)

## 2023-05-16 LAB — T4, FREE: Free T4: 0.89 ng/dL (ref 0.60–1.60)

## 2023-05-16 MED ORDER — PROPYLTHIOURACIL 50 MG PO TABS: 50.0000 mg | ORAL_TABLET | Freq: Every day | ORAL | 3 refills | Status: DC

## 2023-05-16 NOTE — Progress Notes (Signed)
Name: Elizabeth [name]  MRN/ DOB: [id number], 1961/05/17    Age/ Sex: 62 y.o., female     PCP: Sherlene Shams, MD   Reason for Endocrinology Evaluation: Hyperthyroidism/ Hypercalcemia      Initial Endocrinology Clinic Visit: 09/06/2019    PATIENT IDENTIFIER: Elizabeth [name] is a 62 y.o., female with a past medical history of HTN, Hyperthyroidism and Hypercalcemia . She has followed with [facility] since 09/06/2019 for consultative assistance with management of her Hypercalcemia and hyperthyroidism.      HISTORICAL SUMMARY: The patient was first diagnosed with hypercalcemia many years ago.  Ca/Cr ratio 0.013 in 11/2019 which is inconclusive.  DXA 09/25/2019 - normal results.  Status post right inferior parathyroidectomy 01/2021 with normalization of her calcium      THYROID HISTORY :  Has been diagnosed with hyperthyroidism in early 2000's. She has been on PTU since her diagnosis. Thyroid ultrasound in February 2022 and March 2023 showed subcentimeter nodules, and no further imaging was recommended    HTN: Renin was normal 0.464 ng/Ml/hr with normal aldosterone level at 9.1 ng/dL  In 41/3244. Prior to that she had inconclusive testing but confirmed normal by 07/2020 with normal serum potassium.   SUBJECTIVE:     Today (05/16/2023):  Elizabeth [name] is here for hyperthyroidism.   She was evaluated by cardiology for shortness of breath that has been attributed to uncontrolled HTN  She has noted with nervousness  Denies constipation or diarrhea  Denies palpitations  Has noted local neck swelling  Has noted voice hoarseness , was seen by ENT and was attributes to GERD, this has resolved early 2024    PTU 50 mg daily      HISTORY:  Past Medical History:  Past Medical History:  Diagnosis Date   Anxiety    Arthritis    Complication of anesthesia    Slow to wake up   Dental bridge present    permanent upper   GERD (gastroesophageal reflux disease)     Gout    Helicobacter pylori gastritis 02/17/2021   Hypertension    Hyperthyroidism    Thyroid disease    Vertigo    Past Surgical History:  Past Surgical History:  Procedure Laterality Date   ENDOMETRIAL ABLATION  2010   PARATHYROIDECTOMY Right 01/05/2021   Procedure: RIGHT INFERIOR PARATHYROIDECTOMY;  Surgeon: Darnell Level, MD;  Location: [facility];  Service: General;  Laterality: Right;   SHOULDER ARTHROSCOPY WITH ROTATOR CUFF REPAIR AND SUBACROMIAL DECOMPRESSION Right 01/16/2021   Procedure: Right shoulder arthroscopic subscapularis repair, rotator cuff repair, subacromial decompression, and biceps tenodesis - Dedra Skeens to Assist;  Surgeon: Signa Kell, MD;  Location: [facility];  Service: Orthopedics;  Laterality: Right;   WRIST SURGERY Right    Social History:  reports that she quit smoking about 24 years ago. Her smoking use included cigarettes. She has never used smokeless tobacco. She reports current alcohol use. She reports that she does not use drugs. Family History:  Family History  Problem Relation Age of Onset   Hypertension Mother    Heart disease Mother    Heart failure Mother    Heart attack Father    Hypertension Father      HOME MEDICATIONS: Allergies as of 05/16/2023       Reactions   Clonidine Derivatives Itching   Increased anxiety   Codeine    Groggy   Metoprolol    SOB   Tramadol Other (See Comments)   Made her a  zombie        Medication List        Accurate as of May 16, 2023 12:36 PM. If you have any questions, ask your nurse or doctor.          Calcium-Magnesium-Zinc-D3 Tabs Take by mouth. Patient states taking 3 tablets per day   cetirizine 10 MG tablet Commonly known as: ZYRTEC Take 1 tablet (10 mg total) by mouth daily.   cyclobenzaprine 5 MG tablet Commonly known as: FLEXERIL Take 1 tablet (5 mg total) by mouth 3 (three) times daily as needed for muscle spasms.   ergocalciferol 1.25 MG (50000 UT)  capsule Commonly known as: Drisdol Take 1 capsule (50,000 Units total) by mouth once a week.   fluticasone 50 MCG/ACT nasal spray Commonly known as: FLONASE Place 1 spray into both nostrils 2 (two) times daily as needed for rhinitis or allergies (allergies).   gabapentin 300 MG capsule Commonly known as: NEURONTIN Take 1 capsule (300 mg total) by mouth daily as needed (neurpoathy).   losartan 100 MG tablet Commonly known as: COZAAR Take 1 tablet (100 mg total) by mouth daily.   Opzelura 1.5 % Crea Generic drug: Ruxolitinib Phosphate Apply 1 application topically daily. Apply to rash QD until clear then PRN.   propranolol 20 MG tablet Commonly known as: INDERAL Take 1 tablet (20 mg total) by mouth 2 (two) times daily. TAKE 1 TABLET BY MOUTH EVERY DAY   propylthiouracil 50 MG tablet Commonly known as: PTU Take 1 tablet (50 mg total) by mouth daily.   sertraline 50 MG tablet Commonly known as: ZOLOFT Take 1 tablet (50 mg total) by mouth daily after breakfast.   spironolactone 50 MG tablet Commonly known as: ALDACTONE Take 1 tablet (50 mg total) by mouth daily.   triamcinolone cream 0.1 % Commonly known as: KENALOG Apply 1 application topically 2 (two) times daily.          OBJECTIVE:   PHYSICAL EXAM: VS: BP 120/80 (BP Location: Left Arm, Patient Position: Sitting, Cuff Size: Large)   Pulse (!) 56   Ht 5\' 5"  (1.651 m)   Wt 187 lb (84.8 kg)   SpO2 99%   BMI 31.12 kg/m    EXAM: General: Pt appears well and is in NAD  Neck: General: Supple without adenopathy. Thyroid: Thyroid size normal.  No goiter or nodules appreciated.   Lungs: Clear with good BS bilat   Heart: Auscultation: RRR.  Extremities:  BL LE: No pretibial edema  Neuro: Cranial nerves: II - XII grossly intact  Motor: Normal strength throughout DTRs: 2+ and symmetric in UE without delay in relaxation phase     DATA REVIEWED:  Latest Reference Range & Units 05/16/23 10:39  TSH 0.35 - 5.50  uIU/mL 2.47  T4,Free(Direct) 0.60 - 1.60 ng/dL 1.02       Latest Reference Range & Units 04/29/23 14:39  Sodium 135 - 146 mmol/L 137  Potassium 3.5 - 5.3 mmol/L 4.2  Chloride 98 - 110 mmol/L 103  CO2 20 - 32 mmol/L 21  Glucose 65 - 99 mg/dL 78  BUN 7 - 25 mg/dL 14  Creatinine 7.25 - 3.66 mg/dL 4.40 (H)  Calcium 8.6 - 10.4 mg/dL 9.6  BUN/Creatinine Ratio 6 - 22 (calc) 13  (H): Data is abnormally high  Thyroid ultrasound 11/18/2021   Estimated total number of nodules >/= 1 cm: 0   Number of spongiform nodules >/=  2 cm not described below (TR1): 0   Number of  mixed cystic and solid nodules >/= 1.5 cm not described below (TR2): 0   _________________________________________________________   Unchanged hypoechoic 6 mm focus in the right thyroid which does not meet criteria for surveillance or biopsy.   Unchanged 7 mm hypoechoic focus in the left thyroid which does not meet criteria for surveillance or biopsy.   No adenopathy.  No focal fluid identified.   Recommendations follow those established by the new ACR TI-RADS criteria (J Am Coll Radiol 2017;14:587-595).   IMPRESSION: Similar appearance of mild heterogeneity of the thyroid tissue, may indicate medical thyroid disease.   Negative for adenopathy.    ASSESSMENT / PLAN / RECOMMENDATIONS:   Hyperthyroidism:    - Clinically she is euthyroid  - D/D graves' disease vs toxic thyroid nodule (s) -She had thyroid ultrasound   -TFTs remain within normal range -No change   Medications : Continue PTU 50 mg daily         Follow-up in 6 months  Signed electronically by: Lyndle Herrlich, MD  [facility]  [facility] [address] [place], [place] [address] Phone: [phone] FAX: [phone]      CC: Sherlene Shams, MD [address] [place] [address] Phone: [phone]  Fax: [phone]   Return to Endocrinology clinic as  below: Future Appointments  Date Time Provider Department Center  05/24/2023  2:30 PM Otho Ket, RN [facility] None  05/31/2023  9:40 AM [facility]DIAG 1 [facility]  05/31/2023 10:00 AM [facility] MM [facility]-US 1 [facility]  06/01/2023 10:30 AM [facility] [place] 2 [facility] None  07/06/2023 10:40 AM Debbe Odea, MD [facility] None  10/31/2023 10:30 AM Sherlene Shams, MD [facility]  11/15/2023 10:30 AM Quentina Fronek, Konrad Dolores, MD [facility] None

## 2023-05-17 ENCOUNTER — Other Ambulatory Visit: Payer: Self-pay | Admitting: Internal Medicine

## 2023-05-17 NOTE — Telephone Encounter (Signed)
Refilled: 03/01/2023 Last OV: 04/29/2023 Next OV: 10/31/2023

## 2023-05-24 ENCOUNTER — Ambulatory Visit: Payer: Self-pay

## 2023-05-24 NOTE — Patient Outreach (Signed)
Care Coordination   05/24/2023 Name: Elizabeth [name] MRN: [id number] DOB: [birth year]   Care Coordination Outreach Attempts:  An unsuccessful telephone outreach was attempted for a scheduled appointment today. HIPAA compliant voice message left with return call number.   Follow Up Plan:  Additional outreach attempts will be made to offer the patient care coordination information and services.   Encounter Outcome:  No Answer   Care Coordination Interventions:  No, not indicated    George Ina [facility] Care Coordination [phone] direct line

## 2023-05-31 ENCOUNTER — Ambulatory Visit
Admission: RE | Admit: 2023-05-31 | Discharge: 2023-05-31 | Disposition: A | Discharge status: Home / Self Care | Payer: 59 | Source: Ambulatory Visit | Attending: Internal Medicine | Admitting: Internal Medicine

## 2023-05-31 ENCOUNTER — Telehealth: Payer: Self-pay | Admitting: *Deleted

## 2023-05-31 DIAGNOSIS — N6310 Unspecified lump in the right breast, unspecified quadrant: Secondary | ICD-10-CM | POA: Diagnosis present

## 2023-05-31 NOTE — Progress Notes (Signed)
Care Coordination Note  05/31/2023 Name: Elizabeth [name] MRN: [id number] DOB: 06/08/1961  Elizabeth [name] is a 62 y.o. year old female who is a primary care patient of Darrick Huntsman, Mar Daring, MD and is actively engaged with the care management team. I reached out to Lowella Curb by phone today to assist with re-scheduling a follow up visit with the RN Case Manager  Follow up plan: Unsuccessful telephone outreach attempt made. A HIPAA compliant phone message was left for the patient providing contact information and requesting a return call.   Burman Nieves, CCMA Care Coordination Care Guide Direct Dial: [phone]

## 2023-06-01 ENCOUNTER — Ambulatory Visit: Payer: 59 | Attending: Cardiology

## 2023-06-01 DIAGNOSIS — R0602 Shortness of breath: Secondary | ICD-10-CM

## 2023-06-01 LAB — ECHOCARDIOGRAM COMPLETE
Area-P 1/2: 3.6 cm2
S' Lateral: 2.2 cm

## 2023-06-03 NOTE — Progress Notes (Signed)
Care Coordination Note  06/03/2023 Name: Sparkle Gabel MRN: [id number] DOB: 06-05-1961  [name] is a 62 y.o. year old female who is a primary care patient of Darrick Huntsman, Mar Daring, MD and is actively engaged with the care management team. I reached out to Lowella Curb by phone today to assist with re-scheduling a follow up visit with the RN Case Manager  Follow up plan: We have been unable to make contact with the patient for follow up.   Burman Nieves, CCMA Care Coordination Care Guide Direct Dial: [phone]

## 2023-06-07 ENCOUNTER — Telehealth: Payer: Self-pay

## 2023-06-07 NOTE — Patient Outreach (Signed)
Care Coordination   Follow Up Visit Note   06/07/2023 Name: Elizabeth [name] MRN: [id number] DOB: 1961-01-23  Elizabeth [name] is a 62 y.o. year old female who sees Darrick Huntsman, Mar Daring, MD for primary care. Notification received from [facility] that she was unable to re-establish contact with patient to reschedule.  Patient will be closed to care coordination follow up at this time.      Goals Addressed             This Visit's Progress    COMPLETED: management / education of health conditions       Interventions Today    Flowsheet Row Most Recent Value  Chronic Disease   Chronic disease during today's visit Hypertension (HTN), Other  [anxiety, SOB]  General Interventions   General Interventions Discussed/Reviewed General Interventions Reviewed, Doctor Visits  [evaluation of current treatment plan for HTN, anxiety, SOB and patients adherence to plan as established by provider.  Assessed for ongoing anxiety/ SOB symptoms.  Assessed for BP readings.]  Doctor Visits Discussed/Reviewed Doctor Visits Reviewed  Annabell Sabal upcoming provider visits. Advised to keep follow up appointments with providers.]  Education Interventions   Education Provided Provided Education  Provided Verbal Education On Other  [Advised to continue monitoring blood pressures reporting to provider readings outside of established parameters. Advised to take blood pressure readings to next follow up visit with primary care provider.]  Pharmacy Interventions   Pharmacy Dicussed/Reviewed Pharmacy Topics Reviewed  [discussed medication adjustments.  Patient advised to to notify provider of medication side effects/ symptoms. Discussed medication compliance. Advised to notify provider of taking OTC magnesium/zinc/D3 combo supplement.]              SDOH assessments and interventions completed:  No     Care Coordination Interventions:  No, not indicated   Follow up plan: No further intervention required.    Encounter Outcome:  Patient Visit Completed   George Ina [facility] Care Coordination [phone] direct line

## 2023-07-06 ENCOUNTER — Encounter: Payer: Self-pay | Admitting: Cardiology

## 2023-07-06 ENCOUNTER — Ambulatory Visit: Payer: 59 | Attending: Cardiology | Admitting: Cardiology

## 2023-07-06 VITALS — BP 126/70 | HR 63 | Ht 65.0 in | Wt 188.8 lb

## 2023-07-06 DIAGNOSIS — I10 Essential (primary) hypertension: Secondary | ICD-10-CM

## 2023-07-06 DIAGNOSIS — R0602 Shortness of breath: Secondary | ICD-10-CM | POA: Diagnosis not present

## 2023-07-06 NOTE — Patient Instructions (Signed)
Medication Instructions:   Your physician recommends that you continue on your current medications as directed. Please refer to the Current Medication list given to you today.  *If you need a refill on your cardiac medications before your next appointment, please call your pharmacy*   Lab Work:  None ordered  If you have labs (blood work) drawn today and your tests are completely normal, you will receive your results only by: [place] (if you have MyChart) OR A paper copy in the mail If you have any lab test that is abnormal or we need to change your treatment, we will call you to review the results.   Testing/Procedures:  None Ordered   Follow-Up: At [facility], you and your health needs are our priority.  As part of our continuing mission to provide you with exceptional heart care, we have created designated Provider Care Teams.  These Care Teams include your primary Cardiologist (physician) and Advanced Practice Providers (APPs -  Physician Assistants and Nurse Practitioners) who all work together to provide you with the care you need, when you need it.  We recommend signing up for the patient portal called "MyChart".  Sign up information is provided on this After Visit Summary.  MyChart is used to connect with patients for Virtual Visits (Telemedicine).  Patients are able to view lab/test results, encounter notes, upcoming appointments, etc.  Non-urgent messages can be sent to your provider as well.   To learn more about what you can do with MyChart, go to [email or web address].    Your next appointment:    As needed

## 2023-07-06 NOTE — Progress Notes (Signed)
Cardiology Office Note:    Date:  07/06/2023   ID:  Elizabeth [name], DOB [birth year], MRN [id number]  PCP:  Sherlene Shams, MD   [facility] Providers Cardiologist:  Debbe Odea, MD     Referring MD: Sherlene Shams, MD   Chief Complaint  Patient presents with   Follow-up    Discuss cardiac testing results.  Patient denies new or acute cardiac problems/concerns today.      History of Present Illness:    Elizabeth [name] is a 62 y.o. female with a hx of hypertension, hyperthyroidism, former smoker who presents for follow-up.  She was last seen due to shortness of breath on exertion.  Shortness of breath occurred in the setting of elevated blood pressures with systolics in the 200s.  Symptoms have since resolved after restarting BP meds.  Blood pressure now adequately controlled.  Echo was obtained to evaluate any structural abnormalities.  Patient feels well otherwise.  Past Medical History:  Diagnosis Date   Anxiety    Arthritis    Complication of anesthesia    Slow to wake up   Dental bridge present    permanent upper   GERD (gastroesophageal reflux disease)    Gout    Helicobacter pylori gastritis 02/17/2021   Hypertension    Hyperthyroidism    Thyroid disease    Vertigo     Past Surgical History:  Procedure Laterality Date   ENDOMETRIAL ABLATION  2010   PARATHYROIDECTOMY Right 01/05/2021   Procedure: RIGHT INFERIOR PARATHYROIDECTOMY;  Surgeon: Darnell Level, MD;  Location: [facility];  Service: General;  Laterality: Right;   SHOULDER ARTHROSCOPY WITH ROTATOR CUFF REPAIR AND SUBACROMIAL DECOMPRESSION Right 01/16/2021   Procedure: Right shoulder arthroscopic subscapularis repair, rotator cuff repair, subacromial decompression, and biceps tenodesis - Dedra Skeens to Assist;  Surgeon: Signa Kell, MD;  Location: [facility];  Service: Orthopedics;  Laterality: Right;   WRIST SURGERY Right     Current Medications: Current Meds  Medication Sig    cetirizine (ZYRTEC) 10 MG tablet Take 1 tablet (10 mg total) by mouth daily.   cyclobenzaprine (FLEXERIL) 5 MG tablet Take 1 tablet (5 mg total) by mouth 3 (three) times daily as needed for muscle spasms.   fluticasone (FLONASE) 50 MCG/ACT nasal spray Place 1 spray into both nostrils 2 (two) times daily as needed for rhinitis or allergies (allergies).   gabapentin (NEURONTIN) 300 MG capsule Take 1 capsule (300 mg total) by mouth daily as needed (neurpoathy).   losartan (COZAAR) 100 MG tablet Take 1 tablet (100 mg total) by mouth daily.   Multiple Minerals-Vitamins (CALCIUM-MAGNESIUM-ZINC-D3) TABS Take by mouth. Patient states taking 3 tablets per day   propranolol (INDERAL) 20 MG tablet Take 1 tablet (20 mg total) by mouth 2 (two) times daily. TAKE 1 TABLET BY MOUTH EVERY DAY   propylthiouracil (PTU) 50 MG tablet Take 1 tablet (50 mg total) by mouth daily.   Ruxolitinib Phosphate (OPZELURA) 1.5 % CREA Apply 1 application topically daily. Apply to rash QD until clear then PRN.   sertraline (ZOLOFT) 50 MG tablet Take 1 tablet (50 mg total) by mouth daily after breakfast.   spironolactone (ALDACTONE) 50 MG tablet Take 1 tablet (50 mg total) by mouth daily.   triamcinolone cream (KENALOG) 0.1 % Apply 1 application topically 2 (two) times daily.   Vitamin D, Ergocalciferol, (DRISDOL) 1.25 MG (50000 UNIT) CAPS capsule TAKE 1 CAPSULE BY MOUTH ONE TIME PER WEEK     Allergies:   Clonidine  derivatives, Codeine, Metoprolol, and Tramadol   Social History   Socioeconomic History   Marital status: Married    Spouse name: Not on file   Number of children: Not on file   Years of education: Not on file   Highest education level: Some college, no degree  Occupational History   Not on file  Tobacco Use   Smoking status: Former    Current packs/day: 0.00    Types: Cigarettes    Quit date: 01/12/1999    Years since quitting: 24.4   Smokeless tobacco: Never  Vaping Use   Vaping status: Never Used   Substance and Sexual Activity   Alcohol use: Yes    Comment: occ   Drug use: Never   Sexual activity: Not on file  Other Topics Concern   Not on file  Social History Narrative   Lives with husband [name] (also a pt Geographical information systems officer)   Right handed   Social Determinants of Health   Financial Resource Strain: Low Risk  (03/29/2023)   Overall Financial Resource Strain (CARDIA)    Difficulty of Paying Living Expenses: Not very hard  Food Insecurity: No Food Insecurity (03/29/2023)   Hunger Vital Sign    Worried About Running Out of Food in the Last Year: Never true    Ran Out of Food in the Last Year: Never true  Transportation Needs: No Transportation Needs (03/29/2023)   PRAPARE - Administrator, Civil Service (Medical): No    Lack of Transportation (Non-Medical): No  Physical Activity: Insufficiently Active (03/29/2023)   Exercise Vital Sign    Days of Exercise per Week: 1 day    Minutes of Exercise per Session: 30 min  Stress: Stress Concern Present (03/29/2023)   [employer] of Occupational Health - Occupational Stress Questionnaire    Feeling of Stress : To some extent  Social Connections: Moderately Integrated (03/29/2023)   Social Connection and Isolation Panel [NHANES]    Frequency of Communication with Friends and Family: Twice a week    Frequency of Social Gatherings with Friends and Family: Three times a week    Attends Religious Services: More than 4 times per year    Active Member of Clubs or Organizations: No    Attends Engineer, structural: Not on file    Marital Status: Married     Family History: The patient's family history includes Heart attack in her father; Heart disease in her mother; Heart failure in her mother; Hypertension in her father and mother. There is no history of Breast cancer.  ROS:   Please see the history of present illness.     All other systems reviewed and are negative.  EKGs/Labs/Other Studies  Reviewed:    The following studies were reviewed today:       Recent Labs: 02/16/2023: ALT 25; B Natriuretic Peptide 75.7; Hemoglobin 13.0; Platelets 321 02/28/2023: Magnesium 2.0 04/29/2023: BUN 14; Creat 1.11; Potassium 4.2; Sodium 137 05/16/2023: TSH 2.47  Recent Lipid Panel    Component Value Date/Time   CHOL 238 (H) 02/27/2019 0934   TRIG 125.0 02/27/2019 0934   HDL 54.90 02/27/2019 0934   CHOLHDL 4 02/27/2019 0934   VLDL 25.0 02/27/2019 0934   LDLCALC 158 (H) 02/27/2019 0934     Risk Assessment/Calculations:              Physical Exam:    VS:  BP 126/70 (BP Location: Left Arm, Patient Position: Sitting, Cuff Size: Large)   Pulse  63   Ht 5\' 5"  (1.651 m)   Wt 188 lb 12.8 oz (85.6 kg)   SpO2 99%   BMI 31.42 kg/m     Wt Readings from Last 3 Encounters:  07/06/23 188 lb 12.8 oz (85.6 kg)  05/16/23 187 lb (84.8 kg)  05/03/23 187 lb 12.8 oz (85.2 kg)     GEN:  Well nourished, well developed in no acute distress HEENT: Normal NECK: No JVD; No carotid bruits CARDIAC: RRR, no murmurs, rubs, gallops RESPIRATORY:  Clear to auscultation without rales, wheezing or rhonchi  ABDOMEN: Soft, non-tender, non-distended MUSCULOSKELETAL:  No edema; No deformity  SKIN: Warm and dry NEUROLOGIC:  Alert and oriented x 3 PSYCHIATRIC:  Normal affect   ASSESSMENT:    1. SOB (shortness of breath)   2. Primary hypertension     PLAN:    In order of problems listed above:  Shortness of breath, currently resolved with better BP control.  Echo 05/2023 EF 60 to 65%, impaired relaxation, no significant structural abnormalities.  Symptoms likely from uncontrolled hypertension.   Hypertension, BP controlled   Continue losartan 100, Aldactone 50.   Follow-up as needed.     Medication Adjustments/Labs and Tests Ordered: Current medicines are reviewed at length with the patient today.  Concerns regarding medicines are outlined above.  No orders of the defined types were placed in this  encounter.  No orders of the defined types were placed in this encounter.   Patient Instructions  Medication Instructions:   Your physician recommends that you continue on your current medications as directed. Please refer to the Current Medication list given to you today.  *If you need a refill on your cardiac medications before your next appointment, please call your pharmacy*   Lab Work:  None ordered  If you have labs (blood work) drawn today and your tests are completely normal, you will receive your results only by: MyChart Message (if you have MyChart) OR A paper copy in the mail If you have any lab test that is abnormal or we need to change your treatment, we will call you to review the results.   Testing/Procedures:  None Ordered   Follow-Up: At [facility], you and your health needs are our priority.  As part of our continuing mission to provide you with exceptional heart care, we have created designated Provider Care Teams.  These Care Teams include your primary Cardiologist (physician) and Advanced Practice Providers (APPs -  Physician Assistants and Nurse Practitioners) who all work together to provide you with the care you need, when you need it.  We recommend signing up for the patient portal called "MyChart".  Sign up information is provided on this After Visit Summary.  MyChart is used to connect with patients for Virtual Visits (Telemedicine).  Patients are able to view lab/test results, encounter notes, upcoming appointments, etc.  Non-urgent messages can be sent to your provider as well.   To learn more about what you can do with MyChart, go to [email or web address].    Your next appointment:    As needed   Signed, Debbe Odea, MD  07/06/2023 11:51 AM    [facility]

## 2023-10-31 ENCOUNTER — Encounter: Payer: Self-pay | Admitting: Internal Medicine

## 2023-10-31 ENCOUNTER — Ambulatory Visit: Payer: 59 | Admitting: Internal Medicine

## 2023-10-31 VITALS — BP 128/72 | HR 68 | Ht 65.0 in | Wt 191.0 lb

## 2023-10-31 DIAGNOSIS — G8929 Other chronic pain: Secondary | ICD-10-CM

## 2023-10-31 DIAGNOSIS — I10 Essential (primary) hypertension: Secondary | ICD-10-CM

## 2023-10-31 DIAGNOSIS — Z0000 Encounter for general adult medical examination without abnormal findings: Secondary | ICD-10-CM | POA: Diagnosis not present

## 2023-10-31 DIAGNOSIS — N644 Mastodynia: Secondary | ICD-10-CM

## 2023-10-31 DIAGNOSIS — R7301 Impaired fasting glucose: Secondary | ICD-10-CM

## 2023-10-31 DIAGNOSIS — M542 Cervicalgia: Secondary | ICD-10-CM | POA: Diagnosis not present

## 2023-10-31 DIAGNOSIS — M25512 Pain in left shoulder: Secondary | ICD-10-CM

## 2023-10-31 DIAGNOSIS — F320 Major depressive disorder, single episode, mild: Secondary | ICD-10-CM

## 2023-10-31 DIAGNOSIS — E0590 Thyrotoxicosis, unspecified without thyrotoxic crisis or storm: Secondary | ICD-10-CM | POA: Diagnosis not present

## 2023-10-31 DIAGNOSIS — R21 Rash and other nonspecific skin eruption: Secondary | ICD-10-CM

## 2023-10-31 DIAGNOSIS — E785 Hyperlipidemia, unspecified: Secondary | ICD-10-CM

## 2023-10-31 DIAGNOSIS — N6310 Unspecified lump in the right breast, unspecified quadrant: Secondary | ICD-10-CM

## 2023-10-31 DIAGNOSIS — R944 Abnormal results of kidney function studies: Secondary | ICD-10-CM

## 2023-10-31 DIAGNOSIS — Z0001 Encounter for general adult medical examination with abnormal findings: Secondary | ICD-10-CM | POA: Insufficient documentation

## 2023-10-31 LAB — MICROALBUMIN / CREATININE URINE RATIO
Creatinine,U: 80.1 mg/dL
Microalb Creat Ratio: 20.1 mg/g (ref 0.0–30.0)
Microalb, Ur: 1.6 mg/dL (ref 0.0–1.9)

## 2023-10-31 LAB — COMPREHENSIVE METABOLIC PANEL
ALT: 15 U/L (ref 0–35)
AST: 19 U/L (ref 0–37)
Albumin: 4 g/dL (ref 3.5–5.2)
Alkaline Phosphatase: 69 U/L (ref 39–117)
BUN: 16 mg/dL (ref 6–23)
CO2: 23 meq/L (ref 19–32)
Calcium: 9.6 mg/dL (ref 8.4–10.5)
Chloride: 101 meq/L (ref 96–112)
Creatinine, Ser: 1.2 mg/dL (ref 0.40–1.20)
GFR: 48.15 mL/min — ABNORMAL LOW (ref >60.00)
Glucose, Bld: 94 mg/dL (ref 70–99)
Potassium: 4 meq/L (ref 3.5–5.1)
Sodium: 136 meq/L (ref 135–145)
Total Bilirubin: 0.4 mg/dL (ref 0.2–1.2)
Total Protein: 8 g/dL (ref 6.0–8.3)

## 2023-10-31 LAB — LIPID PANEL
Cholesterol: 212 mg/dL — ABNORMAL HIGH (ref 0–200)
HDL: 55.1 mg/dL (ref >39.00)
LDL Cholesterol: 134 mg/dL — ABNORMAL HIGH (ref 0–99)
NonHDL: 157.24
Total CHOL/HDL Ratio: 4
Triglycerides: 116 mg/dL (ref 0.0–149.0)
VLDL: 23.2 mg/dL (ref 0.0–40.0)

## 2023-10-31 LAB — HEMOGLOBIN A1C: Hgb A1c MFr Bld: 6.3 % (ref 4.6–6.5)

## 2023-10-31 LAB — LDL CHOLESTEROL, DIRECT: Direct LDL: 144 mg/dL

## 2023-10-31 LAB — TSH: TSH: 2.72 u[IU]/mL (ref 0.35–5.50)

## 2023-10-31 MED ORDER — MUPIROCIN 2 % EX OINT: 1.0000 | TOPICAL_OINTMENT | Freq: Two times a day (BID) | CUTANEOUS | 0 refills | Status: AC

## 2023-10-31 MED ORDER — CYCLOBENZAPRINE HCL 5 MG PO TABS: 5.0000 mg | ORAL_TABLET | Freq: Three times a day (TID) | ORAL | 2 refills | Status: AC | PRN

## 2023-10-31 MED ORDER — GABAPENTIN 300 MG PO CAPS: 300.0000 mg | ORAL_CAPSULE | Freq: Every day | ORAL | 2 refills | Status: DC | PRN

## 2023-10-31 MED ORDER — SPIRONOLACTONE 50 MG PO TABS
50.0000 mg | ORAL_TABLET | Freq: Every day | ORAL | 1 refills | Status: DC
Start: 2023-10-31 — End: 2024-02-29

## 2023-10-31 MED ORDER — AMOXICILLIN-POT CLAVULANATE 875-125 MG PO TABS: 1.0000 | ORAL_TABLET | Freq: Two times a day (BID) | ORAL | 0 refills | Status: DC

## 2023-10-31 NOTE — Assessment & Plan Note (Signed)

## 2023-10-31 NOTE — Patient Instructions (Addendum)
 I have prescribed mupirocin ointment for the left breast rash.  Use twice daily along with the ointment Dr. Gwen Pounds gave you  Augmentin prescribed for the right sided otitis media (ear infection)  Your repeat mammogram on the right is due next month to follow up on the right breast mass , so I have ordered a diagnostic mammogram on the left as well  I am prescribing a muscle relaxer for the muscle spasm in your neck.  It may make you SLEEPY  You are due for  a  PAP smear. Please schedule with GYN or with me

## 2023-10-31 NOTE — Assessment & Plan Note (Signed)
 Improved, and at goal on losartan 100 mg daily and spironolactone 50 mg added for  possible  hyperaldosteronism (screened recently with equivocal results ( PAC is 7 and PAC/PRA is 33 )  Secondary cause of  RAS ruled out with recent renal doppler. Sleep study ordered  twice but not done despite symptoms of OSA   ECHO done in 2024 noted EF of 60 to 65%

## 2023-10-31 NOTE — Progress Notes (Unsigned)
 Patient ID: Elizabeth [name], female    DOB: 02-29-1961  Age: 63 y.o. MRN: [id number]  The patient is here for annual   preventive examination and management of other chronic and acute problems.   The risk factors are reflected in the social history.  The roster of all physicians providing medical care to patient - is listed in the Snapshot section of the chart.  Activities of daily living:  The patient is 100% independent in all ADLs: dressing, toileting, feeding as well as independent mobility  Home safety : The patient has smoke detectors in the home. They wear seatbelts.  There are no firearms at home. There is no violence in the home.   There is no risks for hepatitis, STDs or HIV. There is no   history of blood transfusion. They have no travel history to infectious disease endemic areas of the world.  The patient has seen their dentist in the last six month. They have seen their eye doctor in the last year. They admit to slight hearing difficulty with regard to whispered voices and some television programs.  They have deferred audiologic testing in the last year.  They do not  have excessive sun exposure. Discussed the need for sun protection: hats, long sleeves and use of sunscreen if there is significant sun exposure.   Diet: the importance of a healthy diet is discussed. They do have a healthy diet.  The benefits of regular aerobic exercise were discussed. She walks 4 times per week ,  20 minutes.   Depression screen: there are no signs or vegative symptoms of depression- irritability, change in appetite, anhedonia, sadness/tearfullness.  Cognitive assessment: the patient manages all their financial and personal affairs and is actively engaged. They could relate day,date,year and events; recalled 2/3 objects at 3 minutes; performed clock-face test normally.  The following portions of the patient's history were reviewed and updated as appropriate: allergies, current medications, past  family history, past medical history,  past surgical history, past social history  and problem list.  Visual acuity was not assessed per patient preference since she has regular follow up with her ophthalmologist. Hearing and body mass index were assessed and reviewed.   During the course of the visit the patient was educated and counseled about appropriate screening and preventive services including : fall prevention , diabetes screening, nutrition counseling, colorectal cancer screening, and recommended immunizations.    CC: The primary encounter diagnosis was Hyperthyroidism. Diagnoses of Essential hypertension, Hyperlipidemia, unspecified hyperlipidemia type, and Impaired fasting glucose were also pertinent to this visit.   1) HTN:  Hypertension: patient checks blood pressure twice weekly at home.  Readings have been for the most part <130/80 at rest . Patient is following a reduced salt diet most days and is taking medications as prescribed (losartan and spironolactone and once daily short acting dose of propranolol ).  PRn use of propranolol (added by Endocrine for hyperthyroidism )  2) recurrent spasms on the left side of neck  starts at base of trapezius and raidates to just above the ear.  Periodic and intermittent,  has  occurred while awake.  Not brought on by turn of head,  feels her ROM of neck is limited  and has not had PT for this before . Has a  history of right rotator cuff sue\rgery in 2022  and right wrist surgery in 1990's   3) has pain in the left breast for several months,  notices it at night only when in bed.  Has not worn underwire bras. Does not feel a mass  4) recurrent  rash on left aureolar  breast.  Previously seen by dermatology. Treated with a cream in the past but rash recurs  5) has a "catch "  under breast with sudden turns and bending over from waist.  Can occur on either side   6) 2 week history of viral URI resulting in sinus headache and congestion; drainage  was clear to yellow,  no fevers,  fatigue and wheezing intermittent,  mild pain in left ear  now with decreased hearing on that side.  Used OTC Nyquil .  And albutero    History Elizabeth [name] has a past medical history of Anxiety, Arthritis, Complication of anesthesia, Dental bridge present, GERD (gastroesophageal reflux disease), Gout, Helicobacter pylori gastritis (02/17/2021), Hypertension, Hyperthyroidism, Thyroid disease, and Vertigo.   She has a past surgical history that includes Endometrial ablation (2010); Wrist surgery (Right); Parathyroidectomy (Right, 01/05/2021); and Shoulder arthroscopy with rotator cuff repair and subacromial decompression (Right, 01/16/2021). i  Her family history includes Heart attack in her father; Heart disease in her mother; Heart failure in her mother; Hypertension in her father and mother.She reports that she quit smoking about 24 years ago. Her smoking use included cigarettes. She has never used smokeless tobacco. She reports current alcohol use. She reports that she does not use drugs.  Outpatient Medications Prior to Visit  Medication Sig Dispense Refill   cetirizine (ZYRTEC) 10 MG tablet Take 1 tablet (10 mg total) by mouth daily. 30 tablet 11   cyclobenzaprine (FLEXERIL) 5 MG tablet Take 1 tablet (5 mg total) by mouth 3 (three) times daily as needed for muscle spasms. 90 tablet 2   fluticasone (FLONASE) 50 MCG/ACT nasal spray Place 1 spray into both nostrils 2 (two) times daily as needed for rhinitis or allergies (allergies). 48 g 1   gabapentin (NEURONTIN) 300 MG capsule Take 1 capsule (300 mg total) by mouth daily as needed (neurpoathy). 30 capsule 2   losartan (COZAAR) 100 MG tablet Take 1 tablet (100 mg total) by mouth daily. 90 tablet 3   Multiple Minerals-Vitamins (CALCIUM-MAGNESIUM-ZINC-D3) TABS Take by mouth. Patient states taking 3 tablets per day     propranolol (INDERAL) 20 MG tablet Take 1 tablet (20 mg total) by mouth 2 (two) times daily. TAKE 1 TABLET  BY MOUTH EVERY DAY 180 tablet 3   propylthiouracil (PTU) 50 MG tablet Take 1 tablet (50 mg total) by mouth daily. 90 tablet 3   Ruxolitinib Phosphate (OPZELURA) 1.5 % CREA Apply 1 application topically daily. Apply to rash QD until clear then PRN. 60 g 1   sertraline (ZOLOFT) 50 MG tablet Take 1 tablet (50 mg total) by mouth daily after breakfast. 90 tablet 3   spironolactone (ALDACTONE) 50 MG tablet Take 1 tablet (50 mg total) by mouth daily. 90 tablet 1   triamcinolone cream (KENALOG) 0.1 % Apply 1 application topically 2 (two) times daily. 30 g 0   Vitamin D, Ergocalciferol, (DRISDOL) 1.25 MG (50000 UNIT) CAPS capsule TAKE 1 CAPSULE BY MOUTH ONE TIME PER WEEK 12 capsule 3   No facility-administered medications prior to visit.    Review of Systems  Objective:  BP 128/72   Pulse 68   Ht 5\' 5"  (1.651 m)   Wt 191 lb (86.6 kg)   SpO2 98%   BMI 31.78 kg/m   Physical Exam  Assessment & Plan:  Hyperthyroidism Assessment & Plan: Secondary to Grave's disease per Endocrinology, managed with  PTU and propranolol.  TSH Is WNL Lab Results  Component Value Date   TSH 2.47 05/16/2023      Essential hypertension Assessment & Plan: Improved, and at goal on losartan 100 mg daily and spironolactone 50 mg added for  possible  hyperaldosteronism (screened recently with equivocal results ( PAC is 7 and PAC/PRA is 33 )  Secondary cause of  RAS ruled out with recent renal doppler. Sleep study ordered  twice but not done despite symptoms of OSA   ECHO done in 2024 noted EF of 60 to 65%       Hyperlipidemia, unspecified hyperlipidemia type  Impaired fasting glucose      I provided 40 minutes of  face-to-face time during this encounter reviewing patient's current problems and past surgeries,  recent labs and imaging studies, providing counseling on the above mentioned problems , and coordination  of care .   Follow-up: No follow-ups on file.   Sherlene Shams, MD

## 2023-10-31 NOTE — Assessment & Plan Note (Signed)
 Secondary to Grave's disease per Endocrinology, managed with  PTU and propranolol.  TSH Is WNL Lab Results  Component Value Date   TSH 2.47 05/16/2023

## 2023-11-01 ENCOUNTER — Encounter: Payer: Self-pay | Admitting: Internal Medicine

## 2023-11-01 DIAGNOSIS — N644 Mastodynia: Secondary | ICD-10-CM | POA: Insufficient documentation

## 2023-11-01 NOTE — Assessment & Plan Note (Signed)
 Tolerating change to  zoloft 50 mg . No changes today

## 2023-11-01 NOTE — Assessment & Plan Note (Addendum)
 Pain radiates to left sided of scalp .  No warning signs of spinal stenosis but liekly has some bone spurring. .    Rec use of MR ,  stretching, heat . If no improvement plain films of cervical spine to be done

## 2023-11-01 NOTE — Assessment & Plan Note (Signed)
 6 month diagnostic mammogram is due

## 2023-11-01 NOTE — Assessment & Plan Note (Signed)
 Etiology unclear given normal exam.. may be pec muscle strain from pendulous breast diagnostic mammogram ordered

## 2023-11-01 NOTE — Assessment & Plan Note (Signed)
 Restricted to left aureola.  Mupirocin ointment prescribed for twice daily use,  advised ot resume cream given to her for same by dermatology and follow up with them if not resolved in 4 weeks

## 2023-11-01 NOTE — Assessment & Plan Note (Signed)
 10 yr risk of CAD using the AHA risk calculator is 7%.  Patient has no incidental evidence of atherosclerosis .  No treatment advised at this time

## 2023-11-01 NOTE — Assessment & Plan Note (Signed)
 Intermittent fluctuations noted ,  with current GFR < 60 but > 50.  Advised to maintain adequate water intake given necessary use of aldactone   Lab Results  Component Value Date   CREATININE 1.20 10/31/2023

## 2023-11-15 ENCOUNTER — Ambulatory Visit: Payer: 59 | Admitting: Internal Medicine

## 2023-11-17 ENCOUNTER — Other Ambulatory Visit: Payer: Self-pay | Admitting: Internal Medicine

## 2023-11-17 DIAGNOSIS — N6310 Unspecified lump in the right breast, unspecified quadrant: Secondary | ICD-10-CM

## 2023-12-02 ENCOUNTER — Ambulatory Visit: Payer: 59 | Admitting: Internal Medicine

## 2023-12-13 ENCOUNTER — Ambulatory Visit: Admitting: Internal Medicine

## 2023-12-15 ENCOUNTER — Other Ambulatory Visit: Payer: Self-pay | Admitting: Internal Medicine

## 2023-12-15 DIAGNOSIS — N6310 Unspecified lump in the right breast, unspecified quadrant: Secondary | ICD-10-CM

## 2023-12-15 DIAGNOSIS — N644 Mastodynia: Secondary | ICD-10-CM

## 2023-12-23 ENCOUNTER — Ambulatory Visit
Admission: RE | Admit: 2023-12-23 | Discharge: 2023-12-23 | Disposition: A | Discharge status: Home / Self Care | Source: Ambulatory Visit | Attending: Internal Medicine | Admitting: Internal Medicine

## 2023-12-23 DIAGNOSIS — N644 Mastodynia: Secondary | ICD-10-CM | POA: Insufficient documentation

## 2023-12-23 DIAGNOSIS — N6310 Unspecified lump in the right breast, unspecified quadrant: Secondary | ICD-10-CM | POA: Diagnosis present

## 2023-12-29 ENCOUNTER — Ambulatory Visit: Admitting: Internal Medicine

## 2024-02-06 ENCOUNTER — Ambulatory Visit: Admitting: Internal Medicine

## 2024-02-06 ENCOUNTER — Other Ambulatory Visit (HOSPITAL_COMMUNITY)
Admission: RE | Admit: 2024-02-06 | Discharge: 2024-02-06 | Disposition: A | Discharge status: Home / Self Care | Source: Ambulatory Visit | Attending: Internal Medicine | Admitting: Internal Medicine

## 2024-02-06 ENCOUNTER — Encounter: Payer: Self-pay | Admitting: Internal Medicine

## 2024-02-06 VITALS — BP 126/70 | HR 60 | Ht 65.0 in | Wt 192.4 lb

## 2024-02-06 DIAGNOSIS — Z124 Encounter for screening for malignant neoplasm of cervix: Secondary | ICD-10-CM

## 2024-02-06 DIAGNOSIS — N393 Stress incontinence (female) (male): Secondary | ICD-10-CM

## 2024-02-06 DIAGNOSIS — R944 Abnormal results of kidney function studies: Secondary | ICD-10-CM

## 2024-02-06 LAB — BASIC METABOLIC PANEL WITH GFR
BUN: 18 mg/dL (ref 6–23)
CO2: 22 meq/L (ref 19–32)
Calcium: 9.9 mg/dL (ref 8.4–10.5)
Chloride: 104 meq/L (ref 96–112)
Creatinine, Ser: 1.15 mg/dL (ref 0.40–1.20)
GFR: 50.58 mL/min — ABNORMAL LOW (ref >60.00)
Glucose, Bld: 94 mg/dL (ref 70–99)
Potassium: 4 meq/L (ref 3.5–5.1)
Sodium: 136 meq/L (ref 135–145)

## 2024-02-06 MED ORDER — SERTRALINE HCL 50 MG PO TABS: 50.0000 mg | ORAL_TABLET | Freq: Every day | ORAL | 3 refills | Status: AC

## 2024-02-06 NOTE — Progress Notes (Signed)
 Subjective:  Patient ID: Elizabeth [name], female    DOB: [birth year]  Age: 63 y.o. MRN: [id number]  CC: The primary encounter diagnosis was Cervical cancer screening. Diagnoses of Decreased GFR and SUI (stress urinary incontinence, female) were also pertinent to this visit.   HPI Ilia Dimaano presents for  Chief Complaint  Patient presents with   Medical Management of Chronic Issues    Pap smear   CERVICAL  CA SCREENING.  LAST SCREEN > 5 YRS AGO PRIOR TO MOVE TO [place].  She has no history of abnormal PAP smears and denies vaginal discharge   post menopausal bleeding.   STRESS INCONTINENCE, CHRONIC  WEARS A PAD.  No prior urogyn evaluation    Outpatient Medications Prior to Visit  Medication Sig Dispense Refill   fluticasone  (FLONASE ) 50 MCG/ACT nasal spray Place 1 spray into both nostrils 2 (two) times daily as needed for rhinitis or allergies (allergies). 48 g 1   gabapentin  (NEURONTIN ) 300 MG capsule Take 1 capsule (300 mg total) by mouth daily as needed (neurpoathy). 30 capsule 2   losartan  (COZAAR ) 100 MG tablet Take 1 tablet (100 mg total) by mouth daily. 90 tablet 3   Multiple Minerals-Vitamins (CALCIUM-MAGNESIUM-ZINC-D3) TABS Take by mouth. Patient states taking 3 tablets per day     mupirocin  ointment (BACTROBAN ) 2 % Apply 1 Application topically 2 (two) times daily. To breast rash 22 g 0   propranolol  (INDERAL ) 20 MG tablet Take 1 tablet (20 mg total) by mouth 2 (two) times daily. TAKE 1 TABLET BY MOUTH EVERY DAY 180 tablet 3   propylthiouracil  (PTU) 50 MG tablet Take 1 tablet (50 mg total) by mouth daily. 90 tablet 3   spironolactone  (ALDACTONE ) 50 MG tablet Take 1 tablet (50 mg total) by mouth daily. 90 tablet 1   triamcinolone  cream (KENALOG ) 0.1 % Apply 1 application topically 2 (two) times daily. 30 g 0   cetirizine  (ZYRTEC ) 10 MG tablet Take 1 tablet (10 mg total) by mouth daily. (Patient not taking: Reported on 02/06/2024) 30 tablet 11   cyclobenzaprine  (FLEXERIL ) 5 MG  tablet Take 1 tablet (5 mg total) by mouth 3 (three) times daily as needed for muscle spasms. (Patient not taking: Reported on 02/06/2024) 90 tablet 2   amoxicillin -clavulanate (AUGMENTIN ) 875-125 MG tablet Take 1 tablet by mouth 2 (two) times daily. 14 tablet 0   Ruxolitinib Phosphate  (OPZELURA ) 1.5 % CREA Apply 1 application topically daily. Apply to rash QD until clear then PRN. (Patient not taking: Reported on 02/06/2024) 60 g 1   sertraline  (ZOLOFT ) 50 MG tablet Take 1 tablet (50 mg total) by mouth daily after breakfast. (Patient not taking: Reported on 02/06/2024) 90 tablet 3   Vitamin D , Ergocalciferol , (DRISDOL ) 1.25 MG (50000 UNIT) CAPS capsule TAKE 1 CAPSULE BY MOUTH ONE TIME PER WEEK (Patient not taking: Reported on 02/06/2024) 12 capsule 3   No facility-administered medications prior to visit.    Review of Systems;  Patient denies headache, fevers, malaise, unintentional weight loss, skin rash, eye pain, sinus congestion and sinus pain, sore throat, dysphagia,  hemoptysis , cough, dyspnea, wheezing, chest pain, palpitations, orthopnea, edema, abdominal pain, nausea, melena, diarrhea, constipation, flank pain, dysuria, hematuria, urinary  Frequency, nocturia, numbness, tingling, seizures,  Focal weakness, Loss of consciousness,  Tremor, insomnia, depression, anxiety, and suicidal ideation.      Objective:  BP 126/70   Pulse 60   Ht 5\' 5"  (1.651 m)   Wt 192 lb 6.4 oz (87.3 kg)  SpO2 94%   BMI 32.02 kg/m   BP Readings from Last 3 Encounters:  02/06/24 126/70  10/31/23 128/72  07/06/23 126/70    Wt Readings from Last 3 Encounters:  02/06/24 192 lb 6.4 oz (87.3 kg)  10/31/23 191 lb (86.6 kg)  07/06/23 188 lb 12.8 oz (85.6 kg)    Physical Exam Abdominal:     Hernia: There is no hernia in the left inguinal area or right inguinal area.  Genitourinary:    General: Normal vulva.     Exam position: Lithotomy position.     Pubic Area: No rash.      Labia:        Right: No rash.         Left: No rash.      Urethra: No prolapse or urethral pain.     Vagina: Normal.     Cervix: Normal.     Uterus: Normal.      Adnexa: Right adnexa normal and left adnexa normal.  Lymphadenopathy:     Lower Body: No right inguinal adenopathy. No left inguinal adenopathy.    Lab Results  Component Value Date   HGBA1C 6.3 10/31/2023   HGBA1C 6.3 08/20/2022   HGBA1C 5.7 08/23/2019    Lab Results  Component Value Date   CREATININE 1.15 02/06/2024   CREATININE 1.20 10/31/2023   CREATININE 1.11 (H) 04/29/2023    Lab Results  Component Value Date   WBC 6.9 02/16/2023   HGB 13.0 02/16/2023   HCT 40.4 02/16/2023   PLT 321 02/16/2023   GLUCOSE 94 02/06/2024   CHOL 212 (H) 10/31/2023   TRIG 116.0 10/31/2023   HDL 55.10 10/31/2023   LDLDIRECT 144.0 10/31/2023   LDLCALC 134 (H) 10/31/2023   ALT 15 10/31/2023   AST 19 10/31/2023   NA 136 02/06/2024   K 4.0 02/06/2024   CL 104 02/06/2024   CREATININE 1.15 02/06/2024   BUN 18 02/06/2024   CO2 22 02/06/2024   TSH 2.72 10/31/2023   HGBA1C 6.3 10/31/2023   MICROALBUR 1.6 10/31/2023    MM 3D DIAGNOSTIC MAMMOGRAM BILATERAL BREAST Result Date: 12/23/2023 CLINICAL DATA:  63 year old female presents for six-month follow-up of RIGHT breast mass and with intermittent OUTER LEFT breast pain. EXAM: DIGITAL DIAGNOSTIC BILATERAL MAMMOGRAM WITH TOMOSYNTHESIS AND CAD; ULTRASOUND LEFT BREAST LIMITED; ULTRASOUND RIGHT BREAST LIMITED TECHNIQUE: Bilateral digital diagnostic mammography and breast tomosynthesis was performed. The images were evaluated with computer-aided detection. ; Targeted ultrasound examination of the left breast was performed.; Targeted ultrasound examination of the right breast was performed COMPARISON:  Previous exam(s). ACR Breast Density Category b: There are scattered areas of fibroglandular density. FINDINGS: Full field views of both breasts demonstrate no suspicious mass, distortion or worrisome calcifications. Targeted  ultrasound is performed, showing a 0.2 x 0.1 x 0.2 cm benign cyst at the 5:30 position of the RIGHT breast 5 cm from the nipple, previously measuring 0.3 x 0.2 x 0.4 cm. No sonographic abnormalities are noted within the OUTER LEFT breast in the area of patient's pain. IMPRESSION: 1. Decreased size of benign cyst within the LOWER RIGHT breast. No imaging follow-up recommended. 2. No mammographic or sonographic abnormalities within the OUTER LEFT breast, in the area of patient's pain. 3. No mammographic evidence of breast malignancy. RECOMMENDATION: Bilateral screening mammogram in 1 year. I have discussed the findings and recommendations with the patient. If applicable, a reminder letter will be sent to the patient regarding the next appointment. BI-RADS CATEGORY  2: Benign. Electronically Signed  By: Sundra Engel M.D.   On: 12/23/2023 11:09   US  LIMITED ULTRASOUND INCLUDING AXILLA RIGHT BREAST Result Date: 12/23/2023 CLINICAL DATA:  63 year old female presents for six-month follow-up of RIGHT breast mass and with intermittent OUTER LEFT breast pain. EXAM: DIGITAL DIAGNOSTIC BILATERAL MAMMOGRAM WITH TOMOSYNTHESIS AND CAD; ULTRASOUND LEFT BREAST LIMITED; ULTRASOUND RIGHT BREAST LIMITED TECHNIQUE: Bilateral digital diagnostic mammography and breast tomosynthesis was performed. The images were evaluated with computer-aided detection. ; Targeted ultrasound examination of the left breast was performed.; Targeted ultrasound examination of the right breast was performed COMPARISON:  Previous exam(s). ACR Breast Density Category b: There are scattered areas of fibroglandular density. FINDINGS: Full field views of both breasts demonstrate no suspicious mass, distortion or worrisome calcifications. Targeted ultrasound is performed, showing a 0.2 x 0.1 x 0.2 cm benign cyst at the 5:30 position of the RIGHT breast 5 cm from the nipple, previously measuring 0.3 x 0.2 x 0.4 cm. No sonographic abnormalities are noted within the  OUTER LEFT breast in the area of patient's pain. IMPRESSION: 1. Decreased size of benign cyst within the LOWER RIGHT breast. No imaging follow-up recommended. 2. No mammographic or sonographic abnormalities within the OUTER LEFT breast, in the area of patient's pain. 3. No mammographic evidence of breast malignancy. RECOMMENDATION: Bilateral screening mammogram in 1 year. I have discussed the findings and recommendations with the patient. If applicable, a reminder letter will be sent to the patient regarding the next appointment. BI-RADS CATEGORY  2: Benign. Electronically Signed   By: Sundra Engel M.D.   On: 12/23/2023 11:09   US  LIMITED ULTRASOUND INCLUDING AXILLA LEFT BREAST  Result Date: 12/23/2023 CLINICAL DATA:  64 year old female presents for six-month follow-up of RIGHT breast mass and with intermittent OUTER LEFT breast pain. EXAM: DIGITAL DIAGNOSTIC BILATERAL MAMMOGRAM WITH TOMOSYNTHESIS AND CAD; ULTRASOUND LEFT BREAST LIMITED; ULTRASOUND RIGHT BREAST LIMITED TECHNIQUE: Bilateral digital diagnostic mammography and breast tomosynthesis was performed. The images were evaluated with computer-aided detection. ; Targeted ultrasound examination of the left breast was performed.; Targeted ultrasound examination of the right breast was performed COMPARISON:  Previous exam(s). ACR Breast Density Category b: There are scattered areas of fibroglandular density. FINDINGS: Full field views of both breasts demonstrate no suspicious mass, distortion or worrisome calcifications. Targeted ultrasound is performed, showing a 0.2 x 0.1 x 0.2 cm benign cyst at the 5:30 position of the RIGHT breast 5 cm from the nipple, previously measuring 0.3 x 0.2 x 0.4 cm. No sonographic abnormalities are noted within the OUTER LEFT breast in the area of patient's pain. IMPRESSION: 1. Decreased size of benign cyst within the LOWER RIGHT breast. No imaging follow-up recommended. 2. No mammographic or sonographic abnormalities within the  OUTER LEFT breast, in the area of patient's pain. 3. No mammographic evidence of breast malignancy. RECOMMENDATION: Bilateral screening mammogram in 1 year. I have discussed the findings and recommendations with the patient. If applicable, a reminder letter will be sent to the patient regarding the next appointment. BI-RADS CATEGORY  2: Benign. Electronically Signed   By: Sundra Engel M.D.   On: 12/23/2023 11:09    Assessment & Plan:  .Cervical cancer screening Assessment & Plan: Pelvic and PA sear done today   Orders: -     Cytology - PAP  Decreased GFR -     Basic metabolic panel with GFR  SUI (stress urinary incontinence, female) Assessment & Plan: Ruling out UTI . Will refer to urogyn upon request    Other orders -  Sertraline  HCl; Take 1 tablet (50 mg total) by mouth daily after breakfast.  Dispense: 90 tablet; Refill: 3     I spent 34 minutes on the day of this face to face encounter reviewing patient's  most recent visit with cardiology,  nephrology,  and neurology,  prior relevant surgical and non surgical procedures, recent  labs and imaging studies, counseling on weight management,  reviewing the assessment and plan with patient, and post visit ordering and reviewing of  diagnostics and therapeutics with patient  .   Follow-up: No follow-ups on file.   Thersia Flax, MD

## 2024-02-07 ENCOUNTER — Ambulatory Visit: Payer: Self-pay | Admitting: Internal Medicine

## 2024-02-07 DIAGNOSIS — Z124 Encounter for screening for malignant neoplasm of cervix: Secondary | ICD-10-CM | POA: Insufficient documentation

## 2024-02-07 DIAGNOSIS — N393 Stress incontinence (female) (male): Secondary | ICD-10-CM | POA: Insufficient documentation

## 2024-02-07 NOTE — Assessment & Plan Note (Signed)
 Ruling out UTI . Will refer to urogyn upon request

## 2024-02-07 NOTE — Assessment & Plan Note (Signed)
 Pelvic and PA sear done today

## 2024-02-09 LAB — CYTOLOGY - PAP
Comment: NEGATIVE
Diagnosis: NEGATIVE
Diagnosis: REACTIVE
High risk HPV: NEGATIVE

## 2024-02-29 ENCOUNTER — Ambulatory Visit: Admitting: Internal Medicine

## 2024-02-29 ENCOUNTER — Encounter: Payer: Self-pay | Admitting: Internal Medicine

## 2024-02-29 VITALS — BP 126/76 | HR 72 | Ht 65.0 in | Wt 192.0 lb

## 2024-02-29 DIAGNOSIS — Z9889 Other specified postprocedural states: Secondary | ICD-10-CM

## 2024-02-29 DIAGNOSIS — R1012 Left upper quadrant pain: Secondary | ICD-10-CM

## 2024-02-29 DIAGNOSIS — Z9089 Acquired absence of other organs: Secondary | ICD-10-CM

## 2024-02-29 DIAGNOSIS — I10 Essential (primary) hypertension: Secondary | ICD-10-CM | POA: Diagnosis not present

## 2024-02-29 DIAGNOSIS — N1831 Chronic kidney disease, stage 3a: Secondary | ICD-10-CM

## 2024-02-29 DIAGNOSIS — M7918 Myalgia, other site: Secondary | ICD-10-CM | POA: Diagnosis not present

## 2024-02-29 DIAGNOSIS — R1011 Right upper quadrant pain: Secondary | ICD-10-CM

## 2024-02-29 DIAGNOSIS — G629 Polyneuropathy, unspecified: Secondary | ICD-10-CM

## 2024-02-29 MED ORDER — GABAPENTIN 300 MG PO CAPS: 300.0000 mg | ORAL_CAPSULE | Freq: Every day | ORAL | 2 refills | Status: AC | PRN

## 2024-02-29 MED ORDER — SPIRONOLACTONE 50 MG PO TABS: 50.0000 mg | ORAL_TABLET | Freq: Every day | ORAL | 1 refills | Status: AC

## 2024-02-29 NOTE — Assessment & Plan Note (Signed)
 Reviewed prior neurology and orthopedic evaluation.

## 2024-02-29 NOTE — Progress Notes (Signed)
 Subjective:  Patient ID: Elizabeth [name], female    DOB: 1961-02-26  Age: 63 y.o. MRN: [id number]  CC: The primary encounter diagnosis was CKD stage 3a, GFR 45-59 ml/min (HCC). Diagnoses of Essential hypertension, Cervical myofascial pain syndrome, S/P removal of parathyroid  gland, Neuropathy, and Abdominal pain, bilateral upper quadrant were also pertinent to this visit.   HPI [name] presents for  Chief Complaint  Patient presents with   follow up on lab results   CKD:  GFR has been < 60 ml/min since 2022 .  Does not use motrin or aleve.  Uses tylenol  . Has hypertension /hyperaldosteronism   Stress:  7 yr old Son had a cardiac arrest while hospitalized for pneumonia.  Still recovering from anoxic brain injury during prolonged resuscitation attempts .  Making good recovery    Bilateral lateral rib pain   aggravated by recent viral URI  with cough   Left sided  neck muscle pain radiates to scalp  Muscle contractions occurring as jerks  at night in legs.  Has frequent episodes of stinging pain occurring randomly in the lateral thighs  and feet managed with  prn gabapentin   . Remote EMG testing  before she moved to [place] included  EMG studies of the neck and the feet , unclear diagnosis of  neuropathy, records not available.   Outpatient Medications Prior to Visit  Medication Sig Dispense Refill   cetirizine  (ZYRTEC ) 10 MG tablet Take 1 tablet (10 mg total) by mouth daily. 30 tablet 11   cyclobenzaprine  (FLEXERIL ) 5 MG tablet Take 1 tablet (5 mg total) by mouth 3 (three) times daily as needed for muscle spasms. 90 tablet 2   fluticasone  (FLONASE ) 50 MCG/ACT nasal spray Place 1 spray into both nostrils 2 (two) times daily as needed for rhinitis or allergies (allergies). 48 g 1   losartan  (COZAAR ) 100 MG tablet Take 1 tablet (100 mg total) by mouth daily. 90 tablet 3   Multiple Minerals-Vitamins (CALCIUM-MAGNESIUM-ZINC-D3) TABS Take by mouth. Patient states taking 3 tablets per day      mupirocin  ointment (BACTROBAN ) 2 % Apply 1 Application topically 2 (two) times daily. To breast rash 22 g 0   propranolol  (INDERAL ) 20 MG tablet Take 1 tablet (20 mg total) by mouth 2 (two) times daily. TAKE 1 TABLET BY MOUTH EVERY DAY 180 tablet 3   propylthiouracil  (PTU) 50 MG tablet Take 1 tablet (50 mg total) by mouth daily. 90 tablet 3   sertraline  (ZOLOFT ) 50 MG tablet Take 1 tablet (50 mg total) by mouth daily after breakfast. 90 tablet 3   triamcinolone  cream (KENALOG ) 0.1 % Apply 1 application topically 2 (two) times daily. 30 g 0   gabapentin  (NEURONTIN ) 300 MG capsule Take 1 capsule (300 mg total) by mouth daily as needed (neurpoathy). 30 capsule 2   spironolactone  (ALDACTONE ) 50 MG tablet Take 1 tablet (50 mg total) by mouth daily. 90 tablet 1   No facility-administered medications prior to visit.    Review of Systems;  Patient denies headache, fevers, malaise, unintentional weight loss, skin rash, eye pain, sinus congestion and sinus pain, sore throat, dysphagia,  hemoptysis , cough, dyspnea, wheezing, chest pain, palpitations, orthopnea, edema, abdominal pain, nausea, melena, diarrhea, constipation, flank pain, dysuria, hematuria, urinary  Frequency, nocturia, numbness, tingling, seizures,  Focal weakness, Loss of consciousness,  Tremor, insomnia, depression, anxiety, and suicidal ideation.      Objective:  BP 126/76   Pulse 72   Ht 5' 5 (1.651 m)  Wt 192 lb (87.1 kg)   SpO2 99%   BMI 31.95 kg/m   BP Readings from Last 3 Encounters:  02/29/24 126/76  02/06/24 126/70  10/31/23 128/72    Wt Readings from Last 3 Encounters:  02/29/24 192 lb (87.1 kg)  02/06/24 192 lb 6.4 oz (87.3 kg)  10/31/23 191 lb (86.6 kg)    Physical Exam Vitals reviewed.  Constitutional:      General: She is not in acute distress.    Appearance: Normal appearance. She is normal weight. She is not ill-appearing, toxic-appearing or diaphoretic.  HENT:     Head: Normocephalic.   Eyes:      General: No scleral icterus.       Right eye: No discharge.        Left eye: No discharge.     Conjunctiva/sclera: Conjunctivae normal.    Cardiovascular:     Rate and Rhythm: Normal rate and regular rhythm.     Heart sounds: Normal heart sounds.  Pulmonary:     Effort: Pulmonary effort is normal. No respiratory distress.     Breath sounds: Normal breath sounds.   Musculoskeletal:        General: Normal range of motion.   Skin:    General: Skin is warm and dry.   Neurological:     General: No focal deficit present.     Mental Status: She is alert and oriented to person, place, and time. Mental status is at baseline.   Psychiatric:        Mood and Affect: Mood normal.        Behavior: Behavior normal.        Thought Content: Thought content normal.        Judgment: Judgment normal.    Lab Results  Component Value Date   HGBA1C 6.3 10/31/2023   HGBA1C 6.3 08/20/2022   HGBA1C 5.7 08/23/2019    Lab Results  Component Value Date   CREATININE 1.15 02/06/2024   CREATININE 1.20 10/31/2023   CREATININE 1.11 (H) 04/29/2023    Lab Results  Component Value Date   WBC 6.9 02/16/2023   HGB 13.0 02/16/2023   HCT 40.4 02/16/2023   PLT 321 02/16/2023   GLUCOSE 94 02/06/2024   CHOL 212 (H) 10/31/2023   TRIG 116.0 10/31/2023   HDL 55.10 10/31/2023   LDLDIRECT 144.0 10/31/2023   LDLCALC 134 (H) 10/31/2023   ALT 15 10/31/2023   AST 19 10/31/2023   NA 136 02/06/2024   K 4.0 02/06/2024   CL 104 02/06/2024   CREATININE 1.15 02/06/2024   BUN 18 02/06/2024   CO2 22 02/06/2024   TSH 2.72 10/31/2023   HGBA1C 6.3 10/31/2023   MICROALBUR 1.6 10/31/2023    No results found.  Assessment & Plan:  .CKD stage 3a, GFR 45-59 ml/min (HCC) Assessment & Plan: Intermittent fluctuations noted ,  with current GFR < 60 but > 50.  Advised to maintain adequate water intake given necessary use of aldactone  . Referring to nephrology   Lab Results  Component Value Date   CREATININE  1.15 02/06/2024     Orders: -     Ambulatory referral to Nephrology  Essential hypertension -     Spironolactone ; Take 1 tablet (50 mg total) by mouth daily.  Dispense: 90 tablet; Refill: 1  Cervical myofascial pain syndrome Assessment & Plan: Reviewed prior neurology and orthopedic evaluation.    S/P removal of parathyroid  gland Assessment & Plan: Surgery was done in May 2022.  Repeat calcium levels have been  normal  Lab Results  Component Value Date   PTH 96 (H) 10/16/2020   CALCIUM 9.9 02/06/2024   CAION 5.0 02/28/2023      Neuropathy Assessment & Plan: EMG studies done n 2021  were  abnormal(EARLY RIGHT MEDIAN NEUROPATHY)  but did not meet criteria for CTS.  Her current symptoms of lower extremity spasms and pain are nor accompaneid by leg weakness or abnormal DTRs.     Abdominal pain, bilateral upper quadrant Assessment & Plan: Exam suggestive of muscle strain give history of viral infection with prolonged cough.    Other orders -     Gabapentin ; Take 1 capsule (300 mg total) by mouth daily as needed (neurpoathy).  Dispense: 30 capsule; Refill: 2     I spent 40  minutes on the day of this face to face encounter reviewing patient's  most recent visit with neurology, prior relevant surgical and non surgical procedures, including 2021 EKG,  cervical spine MRI,  recent  labs and imaging studies, counseling on pain management ,   reviewing the assessment and plan with patient, and post visit ordering and reviewing of  diagnostics and therapeutics with patient  .   Follow-up: Return in about 6 months (around 08/30/2024).   Verneita LITTIE Kettering, MD

## 2024-02-29 NOTE — Assessment & Plan Note (Signed)
 Intermittent fluctuations noted ,  with current GFR < 60 but > 50.  Advised to maintain adequate water intake given necessary use of aldactone  . Referring to nephrology   Lab Results  Component Value Date   CREATININE 1.15 02/06/2024

## 2024-02-29 NOTE — Patient Instructions (Addendum)
 Have made a referral to [facility] .  This is a group of medical doctors called nephrologists' to evaluated your kidneys   Avoid motrin aleve, naprosyn and ibuprofen.  Ok to use tylenol   Monitor your Blood pressure at home and send me 5 readings over the next 2 weeks   Cotinue losartan  and spironolactone    Your rib pain on the right side may be due to muscle strain from coughing during your recent viral infection If it does not resolve in another week, let me know

## 2024-03-02 DIAGNOSIS — R1011 Right upper quadrant pain: Secondary | ICD-10-CM | POA: Insufficient documentation

## 2024-03-02 NOTE — Assessment & Plan Note (Addendum)
 EMG studies done n 2021  were  abnormal(EARLY RIGHT MEDIAN NEUROPATHY)  but did not meet criteria for CTS.  Her current symptoms of lower extremity spasms and pain are nor accompaneid by leg weakness or abnormal DTRs.

## 2024-03-02 NOTE — Assessment & Plan Note (Signed)
 Surgery was done in May 2022.  Repeat calcium levels have been  normal  Lab Results  Component Value Date   PTH 96 (H) 10/16/2020   CALCIUM 9.9 02/06/2024   CAION 5.0 02/28/2023

## 2024-03-02 NOTE — Assessment & Plan Note (Signed)
 Exam suggestive of muscle strain give history of viral infection with prolonged cough.

## 2024-04-19 ENCOUNTER — Other Ambulatory Visit: Payer: Self-pay | Admitting: Nephrology

## 2024-04-19 DIAGNOSIS — N1831 Chronic kidney disease, stage 3a: Secondary | ICD-10-CM

## 2024-04-19 DIAGNOSIS — E785 Hyperlipidemia, unspecified: Secondary | ICD-10-CM

## 2024-04-19 DIAGNOSIS — I10 Essential (primary) hypertension: Secondary | ICD-10-CM

## 2024-04-19 DIAGNOSIS — R8290 Unspecified abnormal findings in urine: Secondary | ICD-10-CM

## 2024-04-19 DIAGNOSIS — E210 Primary hyperparathyroidism: Secondary | ICD-10-CM

## 2024-04-19 DIAGNOSIS — E1121 Type 2 diabetes mellitus with diabetic nephropathy: Secondary | ICD-10-CM

## 2024-04-26 ENCOUNTER — Other Ambulatory Visit: Payer: Self-pay | Admitting: Internal Medicine

## 2024-04-26 DIAGNOSIS — I10 Essential (primary) hypertension: Secondary | ICD-10-CM

## 2024-05-01 ENCOUNTER — Ambulatory Visit
Admission: RE | Admit: 2024-05-01 | Discharge: 2024-05-01 | Disposition: A | Discharge status: Home / Self Care | Source: Ambulatory Visit | Attending: Nephrology | Admitting: Nephrology

## 2024-05-01 DIAGNOSIS — E1121 Type 2 diabetes mellitus with diabetic nephropathy: Secondary | ICD-10-CM | POA: Diagnosis present

## 2024-05-01 DIAGNOSIS — I10 Essential (primary) hypertension: Secondary | ICD-10-CM | POA: Diagnosis present

## 2024-05-01 DIAGNOSIS — N1831 Chronic kidney disease, stage 3a: Secondary | ICD-10-CM | POA: Diagnosis present

## 2024-05-01 DIAGNOSIS — R8290 Unspecified abnormal findings in urine: Secondary | ICD-10-CM | POA: Insufficient documentation

## 2024-05-01 DIAGNOSIS — E785 Hyperlipidemia, unspecified: Secondary | ICD-10-CM | POA: Diagnosis present

## 2024-05-01 DIAGNOSIS — E210 Primary hyperparathyroidism: Secondary | ICD-10-CM | POA: Insufficient documentation

## 2024-05-21 ENCOUNTER — Other Ambulatory Visit: Payer: Self-pay | Admitting: Internal Medicine

## 2024-05-21 DIAGNOSIS — I10 Essential (primary) hypertension: Secondary | ICD-10-CM

## 2024-05-22 ENCOUNTER — Other Ambulatory Visit: Payer: Self-pay | Admitting: Internal Medicine

## 2024-05-22 DIAGNOSIS — I10 Essential (primary) hypertension: Secondary | ICD-10-CM

## 2024-05-22 NOTE — Telephone Encounter (Unsigned)
 Copied from CRM [id number]. Topic: Clinical - Medication Refill >> May 22, 2024 12:34 PM Harlene ORN wrote: Medication: propranolol  (INDERAL ) 20 MG tablet  Has the patient contacted their pharmacy? Yes (Agent: If no, request that the patient contact the pharmacy for the refill. If patient does not wish to contact the pharmacy document the reason why and proceed with request.) (Agent: If yes, when and what did the pharmacy advise?)  This is the patient's preferred pharmacy:  [facility] [phone] [facility], [place] - [address] [place] [address] Phone: [phone] Fax: [phone]   Is this the correct pharmacy for this prescription? Yes If no, delete pharmacy and type the correct one.   Has the prescription been filled recently? Yes  Is the patient out of the medication? Yes  Has the patient been seen for an appointment in the last year OR does the patient have an upcoming appointment? Yes  Can we respond through MyChart? Yes  Agent: Please be advised that Rx refills may take up to 3 business days. We ask that you follow-up with your pharmacy.

## 2024-05-25 MED ORDER — PROPRANOLOL HCL 20 MG PO TABS: 20.0000 mg | ORAL_TABLET | Freq: Two times a day (BID) | ORAL | 1 refills | Status: AC

## 2024-05-25 NOTE — Telephone Encounter (Signed)
 Copied from CRM [id number]. Topic: Clinical - Prescription Issue >> May 25, 2024 11:51 AM Suzen RAMAN wrote: Reason for CRM: Patient called to check on the status of medication propranolol  per chart (INDERAL ) 20 MG tablet E-Prescribing Status: Transmission to pharmacy failed (05/21/2024 10:21 AM EDT). Please submit alternate method for refill.

## 2024-05-25 NOTE — Telephone Encounter (Signed)
 Medication resent

## 2024-07-26 ENCOUNTER — Ambulatory Visit: Admitting: Nurse Practitioner

## 2024-07-26 ENCOUNTER — Encounter: Payer: Self-pay | Admitting: Nurse Practitioner

## 2024-07-26 VITALS — BP 122/78 | HR 73 | Temp 98.0°F | Ht 65.0 in | Wt 189.2 lb

## 2024-07-26 DIAGNOSIS — J040 Acute laryngitis: Secondary | ICD-10-CM

## 2024-07-26 DIAGNOSIS — J069 Acute upper respiratory infection, unspecified: Secondary | ICD-10-CM | POA: Diagnosis not present

## 2024-07-26 DIAGNOSIS — R051 Acute cough: Secondary | ICD-10-CM

## 2024-07-26 MED ORDER — CETIRIZINE HCL 10 MG PO TABS: 10.0000 mg | ORAL_TABLET | Freq: Every day | ORAL | 0 refills | Status: AC

## 2024-07-26 MED ORDER — AMOXICILLIN-POT CLAVULANATE 875-125 MG PO TABS: 1.0000 | ORAL_TABLET | Freq: Two times a day (BID) | ORAL | 0 refills | Status: AC

## 2024-07-26 NOTE — Patient Instructions (Signed)
-  You have been prescribed Augmentin  -You can take Zyrtec  or Allegra to help with postnasal drip. -Use plain Mucinex to help with phlegm. -Continue gargling with salt water to soothe your sore throat. -Make sure to drink plenty of fluids to stay hydrated. -Consider taking probiotics to support your gut health while on antibiotics.

## 2024-07-26 NOTE — Progress Notes (Signed)
 Established Patient Office Visit  Subjective:  Patient ID: Elizabeth [name], female    DOB: 11/22/1960  Age: 63 y.o. MRN: [id number]  CC:  Chief Complaint  Patient presents with   Sore Throat   Cough    Sore throat, coughing up mucus x 1 week Chills on day 2    Discussed the use of AI scribe software for clinical note transcription with the patient, who gave verbal consent to proceed.  History of Present Illness  Discussed the use of AI scribe software for clinical note transcription with the patient, who gave verbal consent to proceed.  History of Present Illness   Elizabeth [name] is a 63 year old female who presents with upper respiratory symptoms including cough and sore throat.  She has experienced these symptoms for five days, starting with a cough that initially improved but then worsened. She frequently blows her nose and clears mucus. A sore throat has persisted for three days, worsening at night and making swallowing more painful.  Three nights ago, she had chills and a fever with cold sweats. She describes sinus congestion and an achy feeling in her head. Nasal passages are congested, with some chest congestion and postnasal drip.  She has been taking Tylenol  for the past three nights to manage symptoms and attempted gargling with salt water for her sore throat, which was ineffective. Her ears sometimes feel irritated and itchy.      Past Medical History:  Diagnosis Date   Anxiety    Arthritis    Complication of anesthesia    Slow to wake up   Dental bridge present    permanent upper   GERD (gastroesophageal reflux disease)    Gout    Helicobacter pylori gastritis 02/17/2021   Hypertension 2005   Hyperthyroidism    Thyroid  disease    Vertigo     Past Surgical History:  Procedure Laterality Date   ENDOMETRIAL ABLATION  2010   PARATHYROIDECTOMY Right 01/05/2021   Procedure: RIGHT INFERIOR PARATHYROIDECTOMY;  Surgeon: Eletha Boas, MD;  Location: [facility];   Service: General;  Laterality: Right;   SHOULDER ARTHROSCOPY WITH ROTATOR CUFF REPAIR AND SUBACROMIAL DECOMPRESSION Right 01/16/2021   Procedure: Right shoulder arthroscopic subscapularis repair, rotator cuff repair, subacromial decompression, and biceps tenodesis - Boas Doyne to Assist;  Surgeon: Tobie Priest, MD;  Location: [facility];  Service: Orthopedics;  Laterality: Right;   WRIST SURGERY Right     Family History  Problem Relation Age of Onset   Hypertension Mother    Heart disease Mother    Heart failure Mother    Heart attack Father    Hypertension Father    Breast cancer Neg Hx     Social History   Socioeconomic History   Marital status: Married    Spouse name: Not on file   Number of children: Not on file   Years of education: Not on file   Highest education level: Some college, no degree  Occupational History   Not on file  Tobacco Use   Smoking status: Former    Current packs/day: 0.00    Types: Cigarettes    Quit date: 01/12/1999    Years since quitting: 25.5   Smokeless tobacco: Never  Vaping Use   Vaping status: Never Used  Substance and Sexual Activity   Alcohol use: Yes    Comment: occ   Drug use: Never   Sexual activity: Not on file  Other Topics Concern   Not on file  Social History Narrative   Lives with husband Lamar (also a pt Geographical Information Systems Officer)   Right handed   Social Drivers of Health   Financial Resource Strain: Medium Risk (02/05/2024)   Overall Financial Resource Strain (CARDIA)    Difficulty of Paying Living Expenses: Somewhat hard  Food Insecurity: Food Insecurity Present (02/05/2024)   Hunger Vital Sign    Worried About Running Out of Food in the Last Year: Sometimes true    Ran Out of Food in the Last Year: Never true  Transportation Needs: No Transportation Needs (02/05/2024)   PRAPARE - Administrator, Civil Service (Medical): No    Lack of Transportation (Non-Medical): No  Physical Activity: Inactive  (02/05/2024)   Exercise Vital Sign    Days of Exercise per Week: 0 days    Minutes of Exercise per Session: 30 min  Stress: Stress Concern Present (02/05/2024)   [employer] of Occupational Health - Occupational Stress Questionnaire    Feeling of Stress : Very much  Social Connections: Socially Integrated (02/05/2024)   Social Connection and Isolation Panel    Frequency of Communication with Friends and Family: Twice a week    Frequency of Social Gatherings with Friends and Family: Once a week    Attends Religious Services: 1 to 4 times per year    Active Member of [name] or Organizations: Yes    Attends Banker Meetings: 1 to 4 times per year    Marital Status: Married  Catering Manager Violence: Not At Risk (03/07/2023)   Humiliation, Afraid, Rape, and Kick questionnaire    Fear of Current or Ex-Partner: No    Emotionally Abused: No    Physically Abused: No    Sexually Abused: No     Outpatient Medications Prior to Visit  Medication Sig Dispense Refill   cyclobenzaprine  (FLEXERIL ) 5 MG tablet Take 1 tablet (5 mg total) by mouth 3 (three) times daily as needed for muscle spasms. 90 tablet 2   fluticasone  (FLONASE ) 50 MCG/ACT nasal spray Place 1 spray into both nostrils 2 (two) times daily as needed for rhinitis or allergies (allergies). 48 g 1   gabapentin  (NEURONTIN ) 300 MG capsule Take 1 capsule (300 mg total) by mouth daily as needed (neurpoathy). 30 capsule 2   losartan  (COZAAR ) 100 MG tablet TAKE 1 TABLET BY MOUTH EVERY DAY 90 tablet 3   Multiple Minerals-Vitamins (CALCIUM-MAGNESIUM-ZINC-D3) TABS Take by mouth. Patient states taking 3 tablets per day     mupirocin  ointment (BACTROBAN ) 2 % Apply 1 Application topically 2 (two) times daily. To breast rash 22 g 0   propranolol  (INDERAL ) 20 MG tablet Take 1 tablet (20 mg total) by mouth 2 (two) times daily. TAKE 1 TABLET BY MOUTH EVERY DAY 180 tablet 1   propylthiouracil  (PTU) 50 MG tablet TAKE 1 TABLET BY MOUTH EVERY DAY  30 tablet 0   sertraline  (ZOLOFT ) 50 MG tablet Take 1 tablet (50 mg total) by mouth daily after breakfast. 90 tablet 3   spironolactone  (ALDACTONE ) 50 MG tablet Take 1 tablet (50 mg total) by mouth daily. 90 tablet 1   triamcinolone  cream (KENALOG ) 0.1 % Apply 1 application topically 2 (two) times daily. 30 g 0   cetirizine  (ZYRTEC ) 10 MG tablet Take 1 tablet (10 mg total) by mouth daily. 30 tablet 11   No facility-administered medications prior to visit.    Allergies  Allergen Reactions   Clonidine  Derivatives Itching    Increased anxiety   Codeine  Groggy   Metoprolol      SOB   Tramadol Other (See Comments)    Made her a zombie     ROS Review of Systems Negative unless indicated in HPI.    Objective:    Physical Exam Constitutional:      Appearance: She is well-developed.  HENT:     Right Ear: Tympanic membrane normal. Tympanic membrane is not erythematous.     Left Ear: Tympanic membrane normal. Tympanic membrane is not erythematous.     Nose:     Right Turbinates: Swollen. Not enlarged.     Left Turbinates: Swollen. Not enlarged.     Right Sinus: No maxillary sinus tenderness or frontal sinus tenderness.     Left Sinus: No maxillary sinus tenderness or frontal sinus tenderness.     Mouth/Throat:     Mouth: Mucous membranes are moist.     Pharynx: Oropharynx is clear. Postnasal drip present. No pharyngeal swelling, oropharyngeal exudate or posterior oropharyngeal erythema.     Tonsils: No tonsillar exudate. 0 on the right. 0 on the left.  Cardiovascular:     Rate and Rhythm: Normal rate and regular rhythm.  Pulmonary:     Effort: Pulmonary effort is normal.     Breath sounds: Normal breath sounds. No stridor. No wheezing.  Neurological:     General: No focal deficit present.     Mental Status: She is alert and oriented to person, place, and time. Mental status is at baseline.  Psychiatric:        Mood and Affect: Mood normal.        Behavior: Behavior  normal.        Thought Content: Thought content normal.        Judgment: Judgment normal.     BP 122/78   Pulse 73   Temp 98 F (36.7 C)   Ht 5' 5 (1.651 m)   Wt 189 lb 3.2 oz (85.8 kg)   SpO2 99%   BMI 31.48 kg/m  Wt Readings from Last 3 Encounters:  07/26/24 189 lb 3.2 oz (85.8 kg)  02/29/24 192 lb (87.1 kg)  02/06/24 192 lb 6.4 oz (87.3 kg)     Health Maintenance  Topic Date Due   Pneumococcal Vaccine: 50+ Years (1 of 2 - PCV) Never done   Zoster Vaccines- Shingrix (1 of 2) Never done   DTaP/Tdap/Td (2 - Td or Tdap) 03/12/2024   COVID-19 Vaccine (3 - 2025-26 season) 05/07/2024   Influenza Vaccine  12/04/2024 (Originally 04/06/2024)   Diabetic kidney evaluation - Urine ACR  10/30/2024   Diabetic kidney evaluation - eGFR measurement  02/05/2025   Mammogram  12/22/2025   Colonoscopy  01/05/2028   Cervical Cancer Screening (HPV/Pap Cotest)  02/05/2029   Hepatitis C Screening  Completed   HIV Screening  Completed   Hepatitis B Vaccines 19-59 Average Risk  Aged Out   HPV VACCINES  Aged Out   Meningococcal B Vaccine  Aged Out    There are no preventive care reminders to display for this patient.  Lab Results  Component Value Date   TSH 2.72 10/31/2023   Lab Results  Component Value Date   WBC 6.9 02/16/2023   HGB 13.0 02/16/2023   HCT 40.4 02/16/2023   MCV 74.7 (L) 02/16/2023   PLT 321 02/16/2023   Lab Results  Component Value Date   NA 136 02/06/2024   K 4.0 02/06/2024   CO2 22 02/06/2024   GLUCOSE 94 02/06/2024  BUN 18 02/06/2024   CREATININE 1.15 02/06/2024   BILITOT 0.4 10/31/2023   ALKPHOS 69 10/31/2023   AST 19 10/31/2023   ALT 15 10/31/2023   PROT 8.0 10/31/2023   ALBUMIN 4.0 10/31/2023   CALCIUM 9.9 02/06/2024   ANIONGAP 9 02/16/2023   EGFR 62 02/09/2021   GFR 50.58 (L) 02/06/2024   Lab Results  Component Value Date   CHOL 212 (H) 10/31/2023   Lab Results  Component Value Date   HDL 55.10 10/31/2023   Lab Results  Component Value  Date   LDLCALC 134 (H) 10/31/2023   Lab Results  Component Value Date   TRIG 116.0 10/31/2023   Lab Results  Component Value Date   CHOLHDL 4 10/31/2023   Lab Results  Component Value Date   HGBA1C 6.3 10/31/2023      Assessment & Plan:   Assessment & Plan URI with cough and congestion Vital signs stable, patient in is no sign of acute distress, nontoxic.  Discussed findings with the patient.  Symptoms present for 1 week with no sign of resolution. - Will treat with Augmentin  - Recommended Zyrtec  or Allegra for postnasal drip. - Advised plain Mucinex for phlegm. -Increase fluid intake, rest. -Will let us  know if symptoms not improving  Orders:   amoxicillin -clavulanate (AUGMENTIN ) 875-125 MG tablet; Take 1 tablet by mouth 2 (two) times daily.   cetirizine  (ZYRTEC ) 10 MG tablet; Take 1 tablet (10 mg total) by mouth daily.  Laryngitis Laryngitis likely secondary to URI. - Recommended Zyrtec  or Allegra for postnasal drip. - Advised plain Mucinex for phlegm. - Perform salt water gargles.     Assessment and Plan Assessment & Plan      Follow-up: Return if symptoms worsen or fail to improve.   Lanyla Costello, NP

## 2024-08-17 ENCOUNTER — Other Ambulatory Visit: Payer: Self-pay | Admitting: Nurse Practitioner

## 2024-08-17 ENCOUNTER — Other Ambulatory Visit: Payer: Self-pay | Admitting: Internal Medicine

## 2024-08-17 DIAGNOSIS — J069 Acute upper respiratory infection, unspecified: Secondary | ICD-10-CM

## 2024-08-25 ENCOUNTER — Other Ambulatory Visit: Payer: Self-pay | Admitting: Internal Medicine

## 2024-08-29 ENCOUNTER — Other Ambulatory Visit: Payer: Self-pay | Admitting: Internal Medicine

## 2024-08-31 ENCOUNTER — Telehealth: Payer: Self-pay

## 2024-08-31 NOTE — Telephone Encounter (Signed)
 Error

## 2024-09-01 ENCOUNTER — Other Ambulatory Visit: Payer: Self-pay | Admitting: Internal Medicine

## 2024-09-03 ENCOUNTER — Telehealth: Admitting: Internal Medicine

## 2024-09-03 ENCOUNTER — Telehealth: Payer: Self-pay | Admitting: Internal Medicine

## 2024-09-03 ENCOUNTER — Encounter: Payer: Self-pay | Admitting: Internal Medicine

## 2024-09-03 DIAGNOSIS — E0590 Thyrotoxicosis, unspecified without thyrotoxic crisis or storm: Secondary | ICD-10-CM

## 2024-09-03 MED ORDER — PROPYLTHIOURACIL 50 MG PO TABS: 50.0000 mg | ORAL_TABLET | Freq: Every day | ORAL | 3 refills | Status: AC

## 2024-09-03 NOTE — Progress Notes (Signed)
 Virtual Visit via Video Note  I connected with Elizabeth [name] on 09/03/2024 at  1:00 PM EST by a video enabled telemedicine application and verified that I am speaking with the correct person using two identifiers.   I discussed the limitations of evaluation and management by telemedicine and the availability of in person appointments. The patient expressed understanding and agreed to proceed.   -Location of the patient : home  -Location of the provider : Office -The names of all persons participating in the telemedicine service : Pt and myself       Name: Elizabeth [name]  MRN/ DOB: [id number], 06-17-1961    Age/ Sex: 63 y.o., female     PCP: Marylynn Verneita CROME, MD   Reason for Endocrinology Evaluation: Hyperthyroidism/ Hypercalcemia      Initial Endocrinology Clinic Visit: 09/06/2019    PATIENT IDENTIFIER: Elizabeth [name] is a 63 y.o., female with a past medical history of HTN, Hyperthyroidism and Hypercalcemia . She has followed with [facility] since 09/06/2019 for consultative assistance with management of her Hypercalcemia and hyperthyroidism.      HISTORICAL SUMMARY: The patient was first diagnosed with hypercalcemia many years ago.  Ca/Cr ratio 0.013 in 11/2019 which is inconclusive.  DXA 09/25/2019 - normal results.  Status post right inferior parathyroidectomy 01/2021 with normalization of her calcium      THYROID  HISTORY :  Has been diagnosed with hyperthyroidism in early 2000's. She has been on PTU since her diagnosis. Thyroid  ultrasound in February 2022 and March 2023 showed subcentimeter nodules, and no further imaging was recommended    HTN: Renin was normal 0.464 ng/Ml/hr with normal aldosterone level at 9.1 ng/dL  In 88/7978. Prior to that she had inconclusive testing but confirmed normal by 07/2020 with normal serum potassium.   SUBJECTIVE:     Today (09/03/2024):  Elizabeth [name] is here for hyperthyroidism.   The patient has NOT been to  our clinic in 15 months The pt follows with nephrology for CKD III No local neck swelling  No palpitations  No tremors No constipation or diarrhea  She continues with chronic hoarseness   PTU 50 mg daily      HISTORY:  Past Medical History:  Past Medical History:  Diagnosis Date   Anxiety    Arthritis    Complication of anesthesia    Slow to wake up   Dental bridge present    permanent upper   GERD (gastroesophageal reflux disease)    Gout    Helicobacter pylori gastritis 02/17/2021   Hypertension 2005   Hyperthyroidism    Thyroid  disease    Vertigo    Past Surgical History:  Past Surgical History:  Procedure Laterality Date   ENDOMETRIAL ABLATION  2010   PARATHYROIDECTOMY Right 01/05/2021   Procedure: RIGHT INFERIOR PARATHYROIDECTOMY;  Surgeon: Eletha Boas, MD;  Location: [facility];  Service: General;  Laterality: Right;   SHOULDER ARTHROSCOPY WITH ROTATOR CUFF REPAIR AND SUBACROMIAL DECOMPRESSION Right 01/16/2021   Procedure: Right shoulder arthroscopic subscapularis repair, rotator cuff repair, subacromial decompression, and biceps tenodesis - Boas Doyne to Assist;  Surgeon: Tobie Priest, MD;  Location: [facility];  Service: Orthopedics;  Laterality: Right;   WRIST SURGERY Right    Social History:  reports that she quit smoking about 25 years ago. Her smoking use included cigarettes. She has never used smokeless tobacco. She reports current alcohol use. She reports that she does not use drugs. Family History:  Family History  Problem Relation Age of  Onset   Hypertension Mother    Heart disease Mother    Heart failure Mother    Heart attack Father    Hypertension Father    Breast cancer Neg Hx      HOME MEDICATIONS: Allergies as of 09/03/2024       Reactions   Clonidine  And Derivatives Itching   Increased anxiety   Codeine    Groggy   Metoprolol     SOB   Tramadol Other (See Comments)   Made her a zombie        Medication List         Accurate as of September 03, 2024 12:52 PM. If you have any questions, ask your nurse or doctor.          amoxicillin -clavulanate 875-125 MG tablet Commonly known as: AUGMENTIN  Take 1 tablet by mouth 2 (two) times daily.   Calcium-Magnesium-Zinc-D3 Tabs Take by mouth. Patient states taking 3 tablets per day   cetirizine  10 MG tablet Commonly known as: ZYRTEC  Take 1 tablet (10 mg total) by mouth daily.   cyclobenzaprine  5 MG tablet Commonly known as: FLEXERIL  Take 1 tablet (5 mg total) by mouth 3 (three) times daily as needed for muscle spasms.   fluticasone  50 MCG/ACT nasal spray Commonly known as: FLONASE  Place 1 spray into both nostrils 2 (two) times daily as needed for rhinitis or allergies (allergies).   gabapentin  300 MG capsule Commonly known as: NEURONTIN  Take 1 capsule (300 mg total) by mouth daily as needed (neurpoathy).   losartan  100 MG tablet Commonly known as: COZAAR  TAKE 1 TABLET BY MOUTH EVERY DAY   mupirocin  ointment 2 % Commonly known as: BACTROBAN  Apply 1 Application topically 2 (two) times daily. To breast rash   propranolol  20 MG tablet Commonly known as: INDERAL  Take 1 tablet (20 mg total) by mouth 2 (two) times daily. TAKE 1 TABLET BY MOUTH EVERY DAY   propylthiouracil  50 MG tablet Commonly known as: PTU TAKE 1 TABLET BY MOUTH EVERY DAY   sertraline  50 MG tablet Commonly known as: ZOLOFT  Take 1 tablet (50 mg total) by mouth daily after breakfast.   spironolactone  50 MG tablet Commonly known as: ALDACTONE  Take 1 tablet (50 mg total) by mouth daily.   triamcinolone  cream 0.1 % Commonly known as: KENALOG  Apply 1 application topically 2 (two) times daily.          OBJECTIVE:   PHYSICAL EXAM: VS: There were no vitals taken for this visit.   EXAM: General: Pt appears well and is in NAD     DATA REVIEWED:   Latest Reference Range & Units 10/31/23 11:25  TSH 0.35 - 5.50 uIU/mL 2.72    Thyroid  ultrasound  11/18/2021   Estimated total number of nodules >/= 1 cm: 0   Number of spongiform nodules >/=  2 cm not described below (TR1): 0   Number of mixed cystic and solid nodules >/= 1.5 cm not described below (TR2): 0   _________________________________________________________   Unchanged hypoechoic 6 mm focus in the right thyroid  which does not meet criteria for surveillance or biopsy.   Unchanged 7 mm hypoechoic focus in the left thyroid  which does not meet criteria for surveillance or biopsy.   No adenopathy.  No focal fluid identified.   Recommendations follow those established by the new ACR TI-RADS criteria (J Am Coll Radiol 2017;14:587-595).   IMPRESSION: Similar appearance of mild heterogeneity of the thyroid  tissue, may indicate medical thyroid  disease.   Negative for adenopathy.  ASSESSMENT / PLAN / RECOMMENDATIONS:   Hyperthyroidism:    - Patient is clinically euthyroid - D/D graves' disease vs toxic thyroid  nodule (s) -She had thyroid  ultrasound which showed subcentimeter nodule - TFTs earlier in the year have been within normal range and stable on current dose -No changes  Medications : Continue PTU 50 mg daily      Follow-up in 6 months  Signed electronically by: Stefano Redgie Butts, MD  [facility]  [facility] [address] [place], [place] [address] Phone: [phone] FAX: [phone]      CC: Marylynn Verneita CROME, MD [address] [place] [address] Phone: [phone]  Fax: [phone]   Return to Endocrinology clinic as below: Future Appointments  Date Time Provider Department Center  09/03/2024  1:00 PM Shrinika Blatz, Donell Redgie, MD [id number] None

## 2024-09-03 NOTE — Telephone Encounter (Signed)
 Can you please contact the patient and schedule her for an office visit with me in 6 months?   Thanks

## 2024-09-05 NOTE — Telephone Encounter (Signed)
 Patient is scheduled for 03/05/2024 at 10:50 AM with Dr. Sam.

## 2024-09-05 NOTE — Telephone Encounter (Signed)
 [id number] to schedule 6 month follow up appointment.

## 2025-03-05 ENCOUNTER — Ambulatory Visit: Admitting: Internal Medicine
